# Patient Record
Sex: Male | Born: 1955
Health system: Southern US, Community
[De-identification: ages and names within clinical notes are randomized; demographics above are authoritative.]

## PROBLEM LIST (undated history)

## (undated) DIAGNOSIS — E785 Hyperlipidemia, unspecified: Secondary | ICD-10-CM

## (undated) DIAGNOSIS — M65839 Other synovitis and tenosynovitis, unspecified forearm: Secondary | ICD-10-CM

## (undated) DIAGNOSIS — B1920 Unspecified viral hepatitis C without hepatic coma: Secondary | ICD-10-CM

## (undated) DIAGNOSIS — Z800 Family history of malignant neoplasm of digestive organs: Secondary | ICD-10-CM

## (undated) DIAGNOSIS — Z86010 Personal history of colonic polyps: Secondary | ICD-10-CM

## (undated) DIAGNOSIS — F411 Generalized anxiety disorder: Secondary | ICD-10-CM

## (undated) DIAGNOSIS — I10 Essential (primary) hypertension: Secondary | ICD-10-CM

## (undated) DIAGNOSIS — J449 Chronic obstructive pulmonary disease, unspecified: Secondary | ICD-10-CM

## (undated) DIAGNOSIS — K219 Gastro-esophageal reflux disease without esophagitis: Secondary | ICD-10-CM

## (undated) DIAGNOSIS — K5790 Diverticulosis of intestine, part unspecified, without perforation or abscess without bleeding: Secondary | ICD-10-CM

## (undated) DIAGNOSIS — M65849 Other synovitis and tenosynovitis, unspecified hand: Secondary | ICD-10-CM

## (undated) HISTORY — DX: Unspecified viral hepatitis C without hepatic coma: B19.20

## (undated) HISTORY — DX: Hyperlipidemia, unspecified: E78.5

## (undated) HISTORY — DX: Diverticulosis of intestine, part unspecified, without perforation or abscess without bleeding: K57.90

## (undated) HISTORY — DX: Other synovitis and tenosynovitis, unspecified hand: M65.849

## (undated) HISTORY — DX: Personal history of colonic polyps: Z86.010

## (undated) HISTORY — DX: Chronic obstructive pulmonary disease, unspecified: J44.9

## (undated) HISTORY — DX: Essential (primary) hypertension: I10

## (undated) HISTORY — DX: Other synovitis and tenosynovitis, unspecified forearm: M65.839

## (undated) HISTORY — PX: LUMBAR LAMINECTOMY: SHX95

## (undated) HISTORY — DX: Gastro-esophageal reflux disease without esophagitis: K21.9

## (undated) HISTORY — DX: Family history of malignant neoplasm of digestive organs: Z80.0

## (undated) HISTORY — DX: Generalized anxiety disorder: F41.1

## (undated) NOTE — *Deleted (*Deleted)
Synopsis: Return visit for COPD  Subjective:   PATIENT ID: Trevor Mack GENDER: male DOB: 1956/06/06, MRN: [id number]   HPI  No chief complaint on file.   Trevor Mack is a 7 year old male, daily smoker with history of COPD who returns to pulmonary clinic with worsening shortness of breath.   He continues to smoke 2 packs per day. He is taking trelegy ellipta daily and has been using his albuterol inhaler 2-3 times per day over the last couple of weeks due to shortness of breath. He reports increased wheezing and chest tightness. He has sputum production in the mornings which is clear and has increased some since he has developed worsening shortness of breath. He denies any prednisone use or hospitalizations of the past year.   Past Medical History:  Diagnosis Date  . ANXIETY 07/23/2008  . COLONIC POLYPS, HX OF 03/26/2006   MULTIPLE FRAGMENTS OF TUBULAR ADENOMAS POLYPS  . Diverticulosis   . Family history of colon cancer    Father   . GERD 07/23/2008  . Hepatitis C   . HYPERLIPIDEMIA 07/23/2008  . HYPERTENSION 07/23/2008  . TENDINITIS, LEFT THUMB 07/15/2010     Family History  Problem Relation Age of Onset  . Colon cancer Father   . Hypertension Father   . Diabetes Father   . Heart attack Father      Social History   Socioeconomic History  . Marital status: Widowed    Spouse name: Not on file  . Number of children: 0  . Years of education: Not on file  . Highest education level: Not on file  Occupational History    Employer: [employer]  Tobacco Use  . Smoking status: Current Every Day Smoker    Packs/day: 2.00    Years: 35.00    Pack years: 70.00    Types: Cigarettes  . Smokeless tobacco: Never Used  . Tobacco comment: Started smoking at age 54.  Currently smoking 1 1/2 ppd.  Vaping Use  . Vaping Use: Never used  Substance and Sexual Activity  . Alcohol use: No  . Drug use: No  . Sexual activity: Not on file  Other Topics Concern  . Not on  file  Social History Narrative  . Not on file   Social Determinants of Health   Financial Resource Strain:   . Difficulty of Paying Living Expenses: Not on file  Food Insecurity:   . Worried About Programme researcher, broadcasting/film/video in the Last Year: Not on file  . Ran Out of Food in the Last Year: Not on file  Transportation Needs:   . Lack of Transportation (Medical): Not on file  . Lack of Transportation (Non-Medical): Not on file  Physical Activity:   . Days of Exercise per Week: Not on file  . Minutes of Exercise per Session: Not on file  Stress:   . Feeling of Stress : Not on file  Social Connections:   . Frequency of Communication with Friends and Family: Not on file  . Frequency of Social Gatherings with Friends and Family: Not on file  . Attends Religious Services: Not on file  . Active Member of Clubs or Organizations: Not on file  . Attends Banker Meetings: Not on file  . Marital Status: Not on file  Intimate Partner Violence:   . Fear of Current or Ex-Partner: Not on file  . Emotionally Abused: Not on file  . Physically Abused: Not on file  .  Sexually Abused: Not on file     No Known Allergies   Outpatient Medications Prior to Visit  Medication Sig Dispense Refill  . albuterol (PROVENTIL) (2.5 MG/3ML) 0.083% nebulizer solution Take 3 mLs (2.5 mg total) by nebulization every 6 (six) hours as needed for wheezing or shortness of breath. Dx code: J44.9 75 mL 11  . albuterol (VENTOLIN HFA) 108 (90 Base) MCG/ACT inhaler Inhale 1-2 puffs into the lungs every 6 (six) hours as needed. 8 g 5  . amLODipine (NORVASC) 5 MG tablet Take 1 tablet by mouth once daily 90 tablet 0  . atorvastatin (LIPITOR) 40 MG tablet Take 1 tablet by mouth once daily 90 tablet 0  . azithromycin (ZITHROMAX) 250 MG tablet Take 2 tablets on day 1, then 1 tablet daily until gone (Patient not taking: Reported on 09/06/2020) 6 tablet 0  . citalopram (CELEXA) 40 MG tablet Take 1 tablet (40 mg total) by  mouth daily. 30 tablet 5  . Fluticasone-Umeclidin-Vilant (TRELEGY ELLIPTA) 100-62.5-25 MCG/INH AEPB Inhale 1 puff into the lungs daily. 60 each 5  . ibuprofen (ADVIL) 200 MG tablet Take 400 mg by mouth every 6 (six) hours as needed.    Marland Kitchen ketoconazole (NIZORAL) 2 % cream Apply 1 application topically daily. 15 g 0  . LORazepam (ATIVAN) 0.5 MG tablet Take 1 tablet by mouth twice daily 60 tablet 0  . losartan (COZAAR) 100 MG tablet Take 1 tablet (100 mg total) by mouth daily. 90 tablet 1  . omeprazole (PRILOSEC) 40 MG capsule Take 1 capsule (40 mg total) by mouth daily. 90 capsule 3  . tamsulosin (FLOMAX) 0.4 MG CAPS capsule Take 1 capsule (0.4 mg total) by mouth daily. 90 capsule 3   No facility-administered medications prior to visit.    ROS    Objective:  There were no vitals filed for this visit.   Physical Exam    CBC    Component Value Date/Time   WBC 10.1 04/17/2018 0839   RBC 5.70 04/17/2018 0839   HGB 16.2 04/17/2018 0839   HCT 47.3 04/17/2018 0839   PLT 169.0 04/17/2018 0839   MCV 83.0 04/17/2018 0839   MCHC 34.3 04/17/2018 0839   RDW 14.7 04/17/2018 0839   LYMPHSABS 2.0 04/17/2018 0839   MONOABS 0.6 04/17/2018 0839   EOSABS 0.1 04/17/2018 0839   BASOSABS 0.1 04/17/2018 0839   BMP Latest Ref Rng & Units 05/26/2020 06/23/2019 03/05/2019  Glucose 70 - 99 mg/dL 93 91 78  BUN 6 - 23 mg/dL 19 13 18   Creatinine 0.40 - 1.50 mg/dL 1.30 8.65 7.84  Sodium 135 - 145 mEq/L 137 140 137  Potassium 3.5 - 5.1 mEq/L 4.1 3.6 3.6  Chloride 96 - 112 mEq/L 101 102 99  CO2 19 - 32 mEq/L 29 27 28   Calcium 8.4 - 10.5 mg/dL 8.8 8.5 9.0   Chest imaging: 11/11/2018 There is no edema or consolidation. The heart size and pulmonary vascularity are normal. No adenopathy. No evident bone lesions.  PFT: PFT Results Latest Ref Rng & Units 04/12/2017  FVC-Pre L 2.43  FVC-Predicted Pre % 52  FVC-Post L 2.85  FVC-Predicted Post % 61  Pre FEV1/FVC % % 62  Post FEV1/FCV % % 61  FEV1-Pre L  1.49  FEV1-Predicted Pre % 42  FEV1-Post L 1.75  DLCO uncorrected ml/min/mmHg 12.29  DLCO UNC% % 39  DLCO corrected ml/min/mmHg 11.82  DLCO COR %Predicted % 38  DLVA Predicted % 61  TLC L 7.02  TLC %  Predicted % 103  RV % Predicted % 214   Labs: Reviewed as above   Sleep study: 12/2017: AHI normal, O2 mean 93%, lowest 91% he left the test early and says that he only got about 2 hours worth of sleep.    Assessment & Plan:   No diagnosis found.  Discussion: ***    Current Outpatient Medications:  .  albuterol (PROVENTIL) (2.5 MG/3ML) 0.083% nebulizer solution, Take 3 mLs (2.5 mg total) by nebulization every 6 (six) hours as needed for wheezing or shortness of breath. Dx code: J44.9, Disp: 75 mL, Rfl: 11 .  albuterol (VENTOLIN HFA) 108 (90 Base) MCG/ACT inhaler, Inhale 1-2 puffs into the lungs every 6 (six) hours as needed., Disp: 8 g, Rfl: 5 .  amLODipine (NORVASC) 5 MG tablet, Take 1 tablet by mouth once daily, Disp: 90 tablet, Rfl: 0 .  atorvastatin (LIPITOR) 40 MG tablet, Take 1 tablet by mouth once daily, Disp: 90 tablet, Rfl: 0 .  azithromycin (ZITHROMAX) 250 MG tablet, Take 2 tablets on day 1, then 1 tablet daily until gone (Patient not taking: Reported on 09/06/2020), Disp: 6 tablet, Rfl: 0 .  citalopram (CELEXA) 40 MG tablet, Take 1 tablet (40 mg total) by mouth daily., Disp: 30 tablet, Rfl: 5 .  Fluticasone-Umeclidin-Vilant (TRELEGY ELLIPTA) 100-62.5-25 MCG/INH AEPB, Inhale 1 puff into the lungs daily., Disp: 60 each, Rfl: 5 .  ibuprofen (ADVIL) 200 MG tablet, Take 400 mg by mouth every 6 (six) hours as needed., Disp: , Rfl:  .  ketoconazole (NIZORAL) 2 % cream, Apply 1 application topically daily., Disp: 15 g, Rfl: 0 .  LORazepam (ATIVAN) 0.5 MG tablet, Take 1 tablet by mouth twice daily, Disp: 60 tablet, Rfl: 0 .  losartan (COZAAR) 100 MG tablet, Take 1 tablet (100 mg total) by mouth daily., Disp: 90 tablet, Rfl: 1 .  omeprazole (PRILOSEC) 40 MG capsule, Take 1 capsule  (40 mg total) by mouth daily., Disp: 90 capsule, Rfl: 3 .  tamsulosin (FLOMAX) 0.4 MG CAPS capsule, Take 1 capsule (0.4 mg total) by mouth daily., Disp: 90 capsule, Rfl: 3

---

## 1998-05-25 ENCOUNTER — Ambulatory Visit (HOSPITAL_COMMUNITY): Admission: RE | Admit: 1998-05-25 | Discharge: 1998-05-25 | Payer: Self-pay | Admitting: Family Medicine

## 1998-07-09 ENCOUNTER — Ambulatory Visit (HOSPITAL_COMMUNITY): Admission: RE | Admit: 1998-07-09 | Discharge: 1998-07-09 | Payer: Self-pay | Admitting: Family Medicine

## 1998-11-01 ENCOUNTER — Other Ambulatory Visit: Admission: RE | Admit: 1998-11-01 | Discharge: 1998-11-01 | Payer: Self-pay | Admitting: Gastroenterology

## 1999-10-05 ENCOUNTER — Encounter: Payer: Self-pay | Admitting: Internal Medicine

## 2005-03-27 ENCOUNTER — Ambulatory Visit: Payer: Self-pay | Admitting: Internal Medicine

## 2005-07-31 ENCOUNTER — Ambulatory Visit: Payer: Self-pay | Admitting: Internal Medicine

## 2005-10-05 ENCOUNTER — Ambulatory Visit: Payer: Self-pay | Admitting: Internal Medicine

## 2006-01-30 ENCOUNTER — Ambulatory Visit: Payer: Self-pay | Admitting: Internal Medicine

## 2006-02-06 ENCOUNTER — Ambulatory Visit: Payer: Self-pay | Admitting: Internal Medicine

## 2006-02-20 ENCOUNTER — Ambulatory Visit: Payer: Self-pay | Admitting: Internal Medicine

## 2006-02-20 ENCOUNTER — Encounter: Admission: RE | Admit: 2006-02-20 | Discharge: 2006-02-20 | Payer: Self-pay | Admitting: Internal Medicine

## 2006-03-14 ENCOUNTER — Ambulatory Visit: Payer: Self-pay | Admitting: Gastroenterology

## 2006-03-26 ENCOUNTER — Encounter: Payer: Self-pay | Admitting: Internal Medicine

## 2006-03-26 ENCOUNTER — Encounter (INDEPENDENT_AMBULATORY_CARE_PROVIDER_SITE_OTHER): Payer: Self-pay | Admitting: *Deleted

## 2006-03-26 ENCOUNTER — Ambulatory Visit: Payer: Self-pay | Admitting: Gastroenterology

## 2006-03-26 DIAGNOSIS — Z860100 Personal history of colon polyps, unspecified: Secondary | ICD-10-CM

## 2006-03-26 HISTORY — DX: Personal history of colonic polyps: Z86.010

## 2006-03-26 HISTORY — DX: Personal history of colon polyps, unspecified: Z86.0100

## 2006-04-25 ENCOUNTER — Encounter: Payer: Self-pay | Admitting: Internal Medicine

## 2006-08-06 ENCOUNTER — Ambulatory Visit: Payer: Self-pay | Admitting: Oncology

## 2007-03-26 ENCOUNTER — Ambulatory Visit: Payer: Self-pay | Admitting: Internal Medicine

## 2007-03-26 LAB — CONVERTED CEMR LAB
ALT: 32 units/L (ref 0–40)
AST: 22 units/L (ref 0–37)
Albumin: 4.1 g/dL (ref 3.5–5.2)
Alkaline Phosphatase: 120 units/L — ABNORMAL HIGH (ref 39–117)
BUN: 15 mg/dL (ref 6–23)
Basophils Absolute: 0.1 10*3/uL (ref 0.0–0.1)
Calcium: 9.2 mg/dL (ref 8.4–10.5)
Chloride: 102 meq/L (ref 96–112)
Cholesterol: 135 mg/dL (ref 0–200)
Creatinine, Ser: 0.7 mg/dL (ref 0.4–1.5)
Eosinophils Absolute: 0.2 10*3/uL (ref 0.0–0.6)
Eosinophils Relative: 2.3 % (ref 0.0–5.0)
Hemoglobin: 17.7 g/dL — ABNORMAL HIGH (ref 13.0–17.0)
Lymphocytes Relative: 31.7 % (ref 12.0–46.0)
MCHC: 34.1 g/dL (ref 30.0–36.0)
MCV: 87.6 fL (ref 78.0–100.0)
Monocytes Relative: 6.6 % (ref 3.0–11.0)
Neutro Abs: 5 10*3/uL (ref 1.4–7.7)
Platelets: 170 10*3/uL (ref 150–400)
RBC: 5.95 M/uL — ABNORMAL HIGH (ref 4.22–5.81)
Sodium: 139 meq/L (ref 135–145)
Total Protein: 7.1 g/dL (ref 6.0–8.3)
Triglycerides: 152 mg/dL — ABNORMAL HIGH (ref 0–149)

## 2007-08-16 ENCOUNTER — Ambulatory Visit: Payer: Self-pay | Admitting: Gastroenterology

## 2007-08-30 ENCOUNTER — Ambulatory Visit: Payer: Self-pay | Admitting: Gastroenterology

## 2007-08-30 ENCOUNTER — Encounter: Payer: Self-pay | Admitting: Gastroenterology

## 2007-08-30 ENCOUNTER — Encounter: Payer: Self-pay | Admitting: Internal Medicine

## 2008-03-25 ENCOUNTER — Telehealth: Payer: Self-pay | Admitting: Internal Medicine

## 2008-06-17 ENCOUNTER — Encounter: Payer: Self-pay | Admitting: Internal Medicine

## 2008-07-13 ENCOUNTER — Telehealth: Payer: Self-pay | Admitting: Internal Medicine

## 2008-07-15 ENCOUNTER — Ambulatory Visit: Payer: Self-pay | Admitting: Internal Medicine

## 2008-07-15 LAB — CONVERTED CEMR LAB
ALT: 31 units/L (ref 0–53)
AST: 19 units/L (ref 0–37)
BUN: 16 mg/dL (ref 6–23)
Basophils Relative: 0.3 % (ref 0.0–3.0)
Bilirubin, Direct: 0.1 mg/dL (ref 0.0–0.3)
CO2: 28 meq/L (ref 19–32)
Creatinine, Ser: 0.9 mg/dL (ref 0.4–1.5)
Eosinophils Absolute: 0.2 10*3/uL (ref 0.0–0.7)
Eosinophils Relative: 2.9 % (ref 0.0–5.0)
HDL: 22.8 mg/dL — ABNORMAL LOW (ref >39.0)
Hemoglobin: 16.9 g/dL (ref 13.0–17.0)
MCHC: 34.4 g/dL (ref 30.0–36.0)
Monocytes Absolute: 0.4 10*3/uL (ref 0.1–1.0)
RBC: 5.45 M/uL (ref 4.22–5.81)
Sodium: 143 meq/L (ref 135–145)
Total Bilirubin: 0.6 mg/dL (ref 0.3–1.2)
Triglycerides: 99 mg/dL (ref 0–149)
WBC: 7.5 10*3/uL (ref 4.5–10.5)

## 2008-07-23 ENCOUNTER — Ambulatory Visit: Payer: Self-pay | Admitting: Internal Medicine

## 2008-07-23 DIAGNOSIS — E785 Hyperlipidemia, unspecified: Secondary | ICD-10-CM

## 2008-07-23 DIAGNOSIS — F411 Generalized anxiety disorder: Secondary | ICD-10-CM

## 2008-07-23 DIAGNOSIS — Z86010 Personal history of colonic polyps: Secondary | ICD-10-CM

## 2008-07-23 DIAGNOSIS — K219 Gastro-esophageal reflux disease without esophagitis: Secondary | ICD-10-CM

## 2008-07-23 DIAGNOSIS — I10 Essential (primary) hypertension: Secondary | ICD-10-CM

## 2008-07-23 HISTORY — DX: Essential (primary) hypertension: I10

## 2008-07-23 HISTORY — DX: Gastro-esophageal reflux disease without esophagitis: K21.9

## 2008-07-23 HISTORY — DX: Hyperlipidemia, unspecified: E78.5

## 2008-07-23 HISTORY — DX: Generalized anxiety disorder: F41.1

## 2008-07-23 LAB — CONVERTED CEMR LAB
Bilirubin Urine: NEGATIVE
Blood in Urine, dipstick: NEGATIVE
Glucose, Urine, Semiquant: NEGATIVE
Ketones, urine, test strip: NEGATIVE
Nitrite: NEGATIVE
Specific Gravity, Urine: 1.015
Urobilinogen, UA: NEGATIVE
WBC Urine, dipstick: NEGATIVE
pH: 7

## 2008-10-08 ENCOUNTER — Telehealth: Payer: Self-pay | Admitting: Internal Medicine

## 2009-08-16 ENCOUNTER — Encounter (INDEPENDENT_AMBULATORY_CARE_PROVIDER_SITE_OTHER): Payer: Self-pay | Admitting: *Deleted

## 2009-12-01 ENCOUNTER — Ambulatory Visit: Payer: Self-pay | Admitting: Internal Medicine

## 2009-12-01 DIAGNOSIS — J069 Acute upper respiratory infection, unspecified: Secondary | ICD-10-CM | POA: Insufficient documentation

## 2009-12-13 ENCOUNTER — Telehealth: Payer: Self-pay | Admitting: Internal Medicine

## 2010-03-28 ENCOUNTER — Ambulatory Visit: Payer: Self-pay | Admitting: Internal Medicine

## 2010-03-28 LAB — CONVERTED CEMR LAB
ALT: 32 units/L (ref 0–53)
AST: 20 units/L (ref 0–37)
Alkaline Phosphatase: 78 units/L (ref 39–117)
Basophils Absolute: 0.1 10*3/uL (ref 0.0–0.1)
Basophils Relative: 0.7 % (ref 0.0–3.0)
Blood in Urine, dipstick: NEGATIVE
CO2: 29 meq/L (ref 19–32)
Calcium: 9.2 mg/dL (ref 8.4–10.5)
Chloride: 107 meq/L (ref 96–112)
Creatinine, Ser: 1 mg/dL (ref 0.4–1.5)
Glucose, Bld: 98 mg/dL (ref 70–99)
HCT: 49.4 % (ref 39.0–52.0)
Hemoglobin: 17.2 g/dL — ABNORMAL HIGH (ref 13.0–17.0)
Ketones, urine, test strip: NEGATIVE
Neutrophils Relative %: 64.4 % (ref 43.0–77.0)
Nitrite: NEGATIVE
PSA: 1.24 ng/mL (ref 0.10–4.00)
Platelets: 155 10*3/uL (ref 150.0–400.0)
Protein, U semiquant: NEGATIVE
RDW: 14.5 % (ref 11.5–14.6)
Urobilinogen, UA: 0.2
VLDL: 35.6 mg/dL (ref 0.0–40.0)
pH: 6

## 2010-04-04 ENCOUNTER — Ambulatory Visit: Payer: Self-pay | Admitting: Internal Medicine

## 2010-07-15 ENCOUNTER — Ambulatory Visit: Payer: Self-pay | Admitting: Internal Medicine

## 2010-07-15 DIAGNOSIS — M65849 Other synovitis and tenosynovitis, unspecified hand: Secondary | ICD-10-CM

## 2010-07-15 DIAGNOSIS — M65839 Other synovitis and tenosynovitis, unspecified forearm: Secondary | ICD-10-CM

## 2010-07-15 HISTORY — DX: Other synovitis and tenosynovitis, unspecified forearm: M65.839

## 2010-08-01 ENCOUNTER — Telehealth: Payer: Self-pay | Admitting: Gastroenterology

## 2010-08-01 ENCOUNTER — Encounter: Payer: Self-pay | Admitting: Gastroenterology

## 2010-08-31 ENCOUNTER — Encounter (INDEPENDENT_AMBULATORY_CARE_PROVIDER_SITE_OTHER): Payer: Self-pay | Admitting: *Deleted

## 2011-01-26 NOTE — Letter (Signed)
Summary: Colonoscopy Letter  [facility]  [address] [place], [place] [address]   Phone: [phone]  Fax: [phone]      August 31, 2010 MRN: [id number]   Trevor Mack [address] [place], [place]  [address]   Dear Mr. Volpe,   According to your medical record, it is time for you to schedule a Colonoscopy. The American Cancer Society recommends this procedure as a method to detect early colon cancer. Patients with a family history of colon cancer, or a personal history of colon polyps or inflammatory bowel disease are at increased risk.  This letter has beeen generated based on the recommendations made at the time of your procedure. If you feel that in your particular situation this may no longer apply, please contact our office.  Please call our office at [phone] to schedule this appointment or to update your records at your earliest convenience.  Thank you for cooperating with [place] to provide you with the very best care possible.   Sincerely,   Vania Rea. Jarold Motto, M.D.  [facility] Gastroenterology Division [phone]

## 2011-01-26 NOTE — Letter (Signed)
Summary: New Patient letter  [facility]  [address] [place], [place] [address]   Phone: [phone]  Fax: [phone]       08/01/2010 MRN: [id number]  Trevor Mack [address] [place], [place]  [address]  Dear Trevor Mack,  Welcome to the Gastroenterology Division at [facility].    You are scheduled to see Dr.  Jarold Motto on 09-13-10 at 10am on the 3rd floor at [facility], [address]  We ask that you try to arrive at our office 15 minutes prior to your appointment time to allow for check-in.  We would like you to complete the enclosed self-administered evaluation form prior to your visit and bring it with you on the day of your appointment.  We will review it with you.  Also, please bring a complete list of all your medications or, if you prefer, bring the medication bottles and we will list them.  Please bring your insurance card so that we may make a copy of it.  If your insurance requires a referral to see a specialist, please bring your referral form from your primary care physician.  Co-payments are due at the time of your visit and may be paid by cash, check or credit card.     Your office visit will consist of a consult with your physician (includes a physical exam), any laboratory testing he/she may order, scheduling of any necessary diagnostic testing (e.g. x-ray, ultrasound, CT-scan), and scheduling of a procedure (e.g. Endoscopy, Colonoscopy) if required.  Please allow enough time on your schedule to allow for any/all of these possibilities.    If you cannot keep your appointment, please call [phone] to cancel or reschedule prior to your appointment date.  This allows [place] the opportunity to schedule an appointment for another patient in need of care.  If you do not cancel or reschedule by 5 p.m. the business day prior to your appointment date, you will be charged a $50.00 late cancellation/no-show fee.    Thank you for choosing [facility] for your medical needs.  We appreciate the opportunity to care for you.  Please visit [place] at our website  to learn more about our practice.                     Sincerely,                                                             The Gastroenterology Division

## 2011-01-26 NOTE — Progress Notes (Signed)
Summary: Blood in stools  Phone Note Call from Patient Call back at Home Phone [phone]   Call For: Dr Jarold Motto Reason for Call: Talk to Nurse Summary of Call: Saw blood in his stools and feel sthat appt on 09-13-10 is too far away.  Initial call taken by: Leanor Kail [facility],  August 01, 2010 12:21 PM  Follow-up for Phone Call        Pt states he had diarrhea for a few days, felt bad and may have had a fever.  Thought he had a virus.  Devoria Albe noticed some blood on tissue when he wiped.  Bright red blood.  No so far today.  Pt sch to see Dr. Jarold Motto on 08/16/10.  Pt states it is about time for colonoscopy.  Pt instructed if bleeding increases to call us back. Follow-up by: Ashok Cordia RN,  August 01, 2010 2:49 PM

## 2011-01-26 NOTE — Assessment & Plan Note (Signed)
Summary: CPX // RS   Vital Signs:  Patient profile:   55 year old male Height:      68 inches Weight:      199 pounds BMI:     30.37 Temp:     98.2 degrees F oral BP sitting:   120 / 86  (left arm) Cuff size:   regular  Vitals Entered By: Duard Brady LPN (April 04, 2010 1:01 PM) CC: cpx - doing ok , questions about stopping smoking , labs done Is Patient Diabetic? No   CC:  cpx - doing ok , questions about stopping smoking , and labs done.  History of Present Illness: 55 year old patient who is seen today for a wellness exam.  Medical problems include dyslipidemia, hypertension, ongoing tobacco use, and a history of colonic polyps.  He has a family history of colon cancer.  His father affected.  His last colonoscopy was 3 years ago.  No concerns or complaints today.  He discontinued chantix due to some mild flank discomfort, but it seemed to be benefiting as far as smoking cessation.  Laboratory studies were reviewed.  Total cholesterol 217 with a LDL 162.  He is on pravastatin 40 mg daily  Preventive Screening-Counseling & Management  Alcohol-Tobacco     Smoking Status: current  Allergies (verified): No Known Drug Allergies  Past History:  Past Medical History: Reviewed history from 07/23/2008 and no changes required. Anxiety Colonic polyps, hx of Hyperlipidemia Hypertension GERD  Past Surgical History: Reviewed history from 07/23/2008 and no changes required. Lumbar laminectomy  colonoscopy  2008  Family History: Reviewed history from 07/23/2008 and no changes required. mother is in her 45s.  History of senile dementia of the Alzheimer's type father died age 42, colon cancer, history of diabetes, coronary artery disease, prior MI  Two brothers and two sisters are in good health  Social History: Reviewed history from 07/23/2008 and no changes required. Married wife with dementia residing in a nursing home  Physical Exam  General:   Well-developed,well-nourished,in no acute distress; alert,appropriate and cooperative throughout examinationl; 124/84 Head:  Normocephalic and atraumatic without obvious abnormalities. No apparent alopecia or balding. Eyes:  No corneal or conjunctival inflammation noted. EOMI. Perrla. Funduscopic exam benign, without hemorrhages, exudates or papilledema. Vision grossly normal. Ears:  External ear exam shows no significant lesions or deformities.  Otoscopic examination reveals clear canals, tympanic membranes are intact bilaterally without bulging, retraction, inflammation or discharge. Hearing is grossly normal bilaterally. Nose:  External nasal examination shows no deformity or inflammation. Nasal mucosa are pink and moist without lesions or exudates. Mouth:  Oral mucosa and oropharynx without lesions or exudates.  Teeth in good repair. Neck:  No deformities, masses, or tenderness noted. Chest Wall:  No deformities, masses, tenderness or gynecomastia noted. Breasts:  No masses or gynecomastia noted Lungs:  Normal respiratory effort, chest expands symmetrically. Lungs are clear to auscultation, no crackles or wheezes. Heart:  Normal rate and regular rhythm. S1 and S2 normal without gallop, murmur, click, rub or other extra sounds. Abdomen:  Bowel sounds positive,abdomen soft and non-tender without masses, organomegaly or hernias noted. Rectal:  No external abnormalities noted. Normal sphincter tone. No rectal masses or tenderness. Genitalia:  Testes bilaterally descended without nodularity, tenderness or masses. No scrotal masses or lesions. No penis lesions or urethral discharge. Prostate:  2+ enlarged.  2+ enlarged.   Msk:  No deformity or scoliosis noted of thoracic or lumbar spine.   Pulses:  R and L carotid,radial,femoral,dorsalis pedis and  posterior tibial pulses are full and equal bilaterally Extremities:  No clubbing, cyanosis, edema, or deformity noted with normal full range of motion of  all joints.   Neurologic:  No cranial nerve deficits noted. Station and gait are normal. Plantar reflexes are down-going bilaterally. DTRs are symmetrical throughout. Sensory, motor and coordinative functions appear intact. Skin:  Intact without suspicious lesions or rashes Cervical Nodes:  No lymphadenopathy noted Axillary Nodes:  No palpable lymphadenopathy Inguinal Nodes:  No significant adenopathy Psych:  Cognition and judgment appear intact. Alert and cooperative with normal attention span and concentration. No apparent delusions, illusions, hallucinations   Impression & Recommendations:  Problem # 1:  Preventive Health Care (ICD-V70.0)  Complete Medication List: 1)  Lisinopril 10 Mg Tabs (Lisinopril) .Marland Kitchen.. 1 once daily 2)  Paxil 20 Mg Tabs (Paroxetine hcl) .Marland Kitchen.. 1 once daily 3)  Prilosec Otc 20 Mg Tbec (Omeprazole magnesium) .... Take 1 tablet by mouth once a day 4)  Pravastatin Sodium 40 Mg Tabs (Pravastatin sodium) .... Two  daily 5)  Chantix Starting Month Pak 0.5 Mg X 11 & 1 Mg X 42 Tabs (Varenicline tartrate) .... As directed 6)  Chantix 1 Mg Tabs (Varenicline tartrate) .... One twice daily 7)  Mucinex Dm 30-600 Mg Xr12h-tab (Dextromethorphan-guaifenesin) .... One by mouth bid 8)  Pravastatin Sodium 80 Mg Tabs (Pravastatin sodium) .... One daily  Patient Instructions: 1)  Please schedule a follow-up appointment in 6 months. 2)  Limit your Sodium (Salt). 3)  It is important that you exercise regularly at least 20 minutes 5 times a week. If you develop chest pain, have severe difficulty breathing, or feel very tired , stop exercising immediately and seek medical attention. 4)  You need to lose weight. Consider a lower calorie diet and regular exercise.  5)  Check your Blood Pressure regularly. If it is above: 140/90  you should make an appointment. Prescriptions: PRAVASTATIN SODIUM 80 MG TABS (PRAVASTATIN SODIUM) one daily  #90 x 6   Entered and Authorized by:   Gordy Savers  MD   Signed by:   Gordy Savers  MD on 04/04/2010   Method used:   Print then Give to Patient   RxID:   [id number] PRAVASTATIN SODIUM 40 MG  TABS (PRAVASTATIN SODIUM) two  daily  #180 x 6   Entered and Authorized by:   Gordy Savers  MD   Signed by:   Gordy Savers  MD on 04/04/2010   Method used:   Print then Give to Patient   RxID:   [id number] PRILOSEC OTC 20 MG TBEC (OMEPRAZOLE MAGNESIUM) Take 1 tablet by mouth once a day  #90 x 6   Entered and Authorized by:   Gordy Savers  MD   Signed by:   Gordy Savers  MD on 04/04/2010   Method used:   Print then Give to Patient   RxID:   [id number] PAXIL 20 MG TABS (PAROXETINE HCL) 1 once daily  #90 x 6   Entered and Authorized by:   Gordy Savers  MD   Signed by:   Gordy Savers  MD on 04/04/2010   Method used:   Print then Give to Patient   RxID:   [id number] LISINOPRIL 10 MG TABS (LISINOPRIL) 1 once daily  #90 x 6   Entered and Authorized by:   Gordy Savers  MD   Signed by:   Gordy Savers  MD on 04/04/2010   Method  used:   Print then Give to Patient   RxID:   [id number] PRAVASTATIN SODIUM 80 MG TABS (PRAVASTATIN SODIUM) one daily  #90 x 6   Entered and Authorized by:   Gordy Savers  MD   Signed by:   Gordy Savers  MD on 04/04/2010   Method used:   Electronically to        [facility] [phone]* (retail)       [address]       [place], [place]  [address]       Ph: [phone]       Fax: [phone]   RxID:   [id number] PRAVASTATIN SODIUM 40 MG  TABS (PRAVASTATIN SODIUM) two  daily  #180 x 6   Entered and Authorized by:   Gordy Savers  MD   Signed by:   Gordy Savers  MD on 04/04/2010   Method used:   Electronically to        [facility] # [phone]* (retail)       [address]       [place], [place]  [address]       Ph: [phone]       Fax:  [phone]   RxID:   [id number] PRILOSEC OTC 20 MG TBEC (OMEPRAZOLE MAGNESIUM) Take 1 tablet by mouth once a day  #90 x 6   Entered and Authorized by:   Gordy Savers  MD   Signed by:   Gordy Savers  MD on 04/04/2010   Method used:   Electronically to        [facility] # [phone]* (retail)       [address]       [place], [place]  [address]       Ph: [phone]       Fax: [phone]   RxID:   [id number] PAXIL 20 MG TABS (PAROXETINE HCL) 1 once daily  #90 x 6   Entered and Authorized by:   Gordy Savers  MD   Signed by:   Gordy Savers  MD on 04/04/2010   Method used:   Electronically to        [facility] # [phone]* (retail)       [address]       [place], [place]  [address]       Ph: [phone]       Fax: [phone]   RxID:   [id number] LISINOPRIL 10 MG TABS (LISINOPRIL) 1 once daily  #90 x 6   Entered and Authorized by:   Gordy Savers  MD   Signed by:   Gordy Savers  MD on 04/04/2010   Method used:   Electronically to        [facility] # [phone]* (retail)       [address]       [place], [place]  [address]       Ph: [phone]       Fax: [phone]   RxID:   [id number]

## 2011-01-26 NOTE — Assessment & Plan Note (Signed)
Summary: HAND PAIN FOR A COUPLE OF WKS/NJR   Vital Signs:  Patient profile:   55 year old male Weight:      200 pounds Temp:     98.4 degrees F oral BP sitting:   150 / 90  (left arm) Cuff size:   regular  Vitals Entered By: Kathrynn Speed CMA (July 15, 2010 10:57 AM) CC: Pain in left hand for x 2 weeks, sometimes shooting pain, src   CC:  Pain in left hand for x 2 weeks, sometimes shooting pain, and src.  History of Present Illness: 55 year old left handed patient, who has a history of hypertension, who presents with a two week history of left thumb pain.  Plan is maximal over the thenar eminence is also present on the dorsal aspect with some radiation up the left arm.  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 1 Once Daily 2)  Paxil 20 Mg Tabs (Paroxetine Hcl) .Marland Kitchen.. 1 Once Daily 3)  Prilosec Otc 20 Mg Tbec (Omeprazole Magnesium) .... Take 1 Tablet By Mouth Once A Day 4)  Pravastatin Sodium 40 Mg  Tabs (Pravastatin Sodium) .... Two  Daily 5)  Mucinex Dm 30-600 Mg Xr12h-Tab (Dextromethorphan-Guaifenesin) .... One By Mouth Bid 6)  Pravastatin Sodium 80 Mg Tabs (Pravastatin Sodium) .... One Daily  Allergies (verified): No Known Drug Allergies  Physical Exam  General:  Well-developed,well-nourished,in no acute distress; alert,appropriate and cooperative throughout examination; 140/80 Msk:  there is mild tenderness over the left thenar eminence, flexion and extension of the thumb against resistance, aggravated the pain.  No joint tenderness, swelling, or excessive warmth   Impression & Recommendations:  Problem # 1:  TENDINITIS, LEFT THUMB (ICD-727.05)  Problem # 2:  HYPERTENSION (ICD-401.9)  His updated medication list for this problem includes:    Lisinopril 10 Mg Tabs (Lisinopril) .Marland Kitchen... 1 once daily  His updated medication list for this problem includes:  Lisinopril 10 Mg Tabs (Lisinopril) .Marland Kitchen... 1 once daily  Complete Medication List: 1)  Lisinopril 10 Mg Tabs (Lisinopril) .Marland Kitchen.. 1 once daily 2)  Paxil 20 Mg Tabs (Paroxetine hcl) .Marland Kitchen.. 1 once daily 3)  Prilosec Otc 20 Mg Tbec (Omeprazole magnesium) .... Take 1 tablet by mouth once a day 4)  Pravastatin Sodium 40 Mg Tabs (Pravastatin sodium) .... Two  daily 5)  Mucinex Dm 30-600 Mg Xr12h-tab (Dextromethorphan-guaifenesin) .... One by mouth bid 6)  Pravastatin Sodium 80 Mg Tabs (Pravastatin sodium) .... One daily  Patient Instructions: 1)  avoid overuse activities as much as possible 2)  VIMOVO one twice daily

## 2011-04-24 ENCOUNTER — Other Ambulatory Visit: Payer: Self-pay | Admitting: Internal Medicine

## 2011-05-10 ENCOUNTER — Ambulatory Visit (INDEPENDENT_AMBULATORY_CARE_PROVIDER_SITE_OTHER): Payer: BC Managed Care – PPO | Admitting: Internal Medicine

## 2011-05-10 ENCOUNTER — Encounter: Payer: Self-pay | Admitting: Internal Medicine

## 2011-05-10 VITALS — BP 110/76 | Temp 98.6°F | Wt 205.0 lb

## 2011-05-10 DIAGNOSIS — I10 Essential (primary) hypertension: Secondary | ICD-10-CM

## 2011-05-10 DIAGNOSIS — M25519 Pain in unspecified shoulder: Secondary | ICD-10-CM

## 2011-05-10 DIAGNOSIS — M25512 Pain in left shoulder: Secondary | ICD-10-CM

## 2011-05-10 DIAGNOSIS — M542 Cervicalgia: Secondary | ICD-10-CM

## 2011-05-10 MED ORDER — CYCLOBENZAPRINE HCL 10 MG PO TABS: 10.0000 mg | ORAL_TABLET | Freq: Three times a day (TID) | ORAL | Status: DC | PRN

## 2011-05-10 MED ORDER — PRAVASTATIN SODIUM 40 MG PO TABS: 40.0000 mg | ORAL_TABLET | Freq: Every day | ORAL | Status: DC

## 2011-05-10 MED ORDER — LISINOPRIL 10 MG PO TABS: 10.0000 mg | ORAL_TABLET | Freq: Every day | ORAL | Status: DC

## 2011-05-10 MED ORDER — METHYLPREDNISOLONE ACETATE 80 MG/ML IJ SUSP
80.0000 mg | Freq: Once | INTRAMUSCULAR | Status: AC
  Administered 2011-05-10: 80 mg via INTRAMUSCULAR

## 2011-05-10 MED ORDER — OMEPRAZOLE 20 MG PO CPDR: 20.0000 mg | DELAYED_RELEASE_CAPSULE | Freq: Every day | ORAL | Status: DC

## 2011-05-10 MED ORDER — PAROXETINE HCL 40 MG PO TABS: 40.0000 mg | ORAL_TABLET | ORAL | Status: DC

## 2011-05-10 NOTE — Patient Instructions (Signed)
You  may move around, but avoid painful motions and activities.  Apply  Heat to the sore area for 15 to 20 minutes 3 or 4 times daily for the next two to 3 days.  Call or return to clinic prn if these symptoms worsen or fail to improve as anticipated.  

## 2011-05-10 NOTE — Progress Notes (Signed)
  Subjective:    Patient ID: Trevor Mack, male    DOB: 09-27-1956, 55 y.o.   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 He has treated hypertension which has been stable. He has a history of left shoulder bursitis in the past. He states he has some GI upset and wishes to avoid anti-inflammatory medications. These have frequently caused GI distress in the past    Review of Systems  Constitutional: Negative for fever, chills, appetite change and fatigue.  HENT: Negative for hearing loss, ear pain, congestion, sore throat, trouble swallowing, neck stiffness, dental problem, voice change and tinnitus.   Eyes: Negative for pain, discharge and visual disturbance.  Respiratory: Negative for cough, chest tightness, wheezing and stridor.   Cardiovascular: Negative for chest pain, palpitations and leg swelling.  Gastrointestinal: Negative for nausea, vomiting, abdominal pain, diarrhea, constipation, blood in stool and abdominal distention.  Genitourinary: Negative for urgency, hematuria, flank pain, discharge, difficulty urinating and genital sores.  Musculoskeletal: Positive for back pain. Negative for myalgias, joint swelling, arthralgias and gait problem.  Skin: Negative for rash.  Neurological: Negative for dizziness, syncope, speech difficulty, weakness, numbness and headaches.  Hematological: Negative for adenopathy. Does not bruise/bleed easily.  Psychiatric/Behavioral: Negative for behavioral problems and dysphoric mood. The patient is not nervous/anxious.        Objective:   Physical Exam  Constitutional: He appears well-developed and well-nourished. No distress.  Musculoskeletal: Normal range of motion. He exhibits no edema.       Full range of motion of the left shoulder. He did  have tenderness over the trapezius especially and also the left posterior neck musculature          Assessment & Plan:   Headache and neck pain. Musculoskeletal  In view of his GI intolerance to anti-inflammatory medications we'll treat with Depo-Medrol 80 mg daily was given a prescription for Flexeril. He will rest apply warm compresses. He will call if unimproved

## 2011-05-16 ENCOUNTER — Ambulatory Visit (INDEPENDENT_AMBULATORY_CARE_PROVIDER_SITE_OTHER): Payer: BC Managed Care – PPO | Admitting: Internal Medicine

## 2011-05-16 ENCOUNTER — Encounter: Payer: Self-pay | Admitting: Internal Medicine

## 2011-05-16 VITALS — Temp 97.9°F | Wt 200.0 lb

## 2011-05-16 DIAGNOSIS — D219 Benign neoplasm of connective and other soft tissue, unspecified: Secondary | ICD-10-CM

## 2011-05-16 DIAGNOSIS — D239 Other benign neoplasm of skin, unspecified: Secondary | ICD-10-CM

## 2011-05-16 NOTE — Patient Instructions (Signed)
Keep area clean and dry Apply a new  Band-Aid daily  Call if  signs of redness pain or drainage

## 2011-05-16 NOTE — Progress Notes (Signed)
  Subjective:    Patient ID: Trevor Mack, male    DOB: July 30, 1956, 55 y.o.   MRN: [id number]  HPI  55 year old patient who is seen today for excision of a benign lesion involving his left anterior neck area at the collar line. Over the years this has been consistently irritated due to friction from his collar. There has been very slow benign tumor growth.   Review of Systems  Skin: Positive for wound.       Objective:   Physical Exam  Neck:         A pedunculated 5 mm lesion slightly hyperkeratotic consistent with a fibroma present involving the left anterior neck area          Assessment & Plan:   Pedunculated fibroma left anterolateral neck. After prep with Betadine and alcohol, the area was locally anesthetized with 1% Xylocaine with epinephrine. The lesion was excised just beneath the skin level. Hemostasis was obtained with pressure; antibiotic ointment and a bandage applied

## 2011-11-01 ENCOUNTER — Other Ambulatory Visit: Payer: Self-pay | Admitting: Internal Medicine

## 2012-01-25 ENCOUNTER — Ambulatory Visit (INDEPENDENT_AMBULATORY_CARE_PROVIDER_SITE_OTHER): Payer: BC Managed Care – PPO | Admitting: Internal Medicine

## 2012-01-25 ENCOUNTER — Encounter: Payer: Self-pay | Admitting: Internal Medicine

## 2012-01-25 DIAGNOSIS — E785 Hyperlipidemia, unspecified: Secondary | ICD-10-CM

## 2012-01-25 DIAGNOSIS — F411 Generalized anxiety disorder: Secondary | ICD-10-CM

## 2012-01-25 MED ORDER — CITALOPRAM HYDROBROMIDE 40 MG PO TABS: 40.0000 mg | ORAL_TABLET | Freq: Every day | ORAL | Status: DC

## 2012-01-25 NOTE — Patient Instructions (Signed)
It is important that you exercise regularly, at least 20 minutes 3 to 4 times per week.  If you develop chest pain or shortness of breath seek  medical attention.  Limit your sodium (Salt) intake  Behavioral Health referral as discussed
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
## 2012-02-22 ENCOUNTER — Ambulatory Visit (INDEPENDENT_AMBULATORY_CARE_PROVIDER_SITE_OTHER): Payer: BC Managed Care – PPO | Admitting: Internal Medicine

## 2012-02-22 ENCOUNTER — Encounter: Payer: Self-pay | Admitting: Internal Medicine

## 2012-02-22 DIAGNOSIS — I10 Essential (primary) hypertension: Secondary | ICD-10-CM

## 2012-02-22 DIAGNOSIS — F411 Generalized anxiety disorder: Secondary | ICD-10-CM

## 2012-02-22 MED ORDER — LORAZEPAM 0.5 MG PO TABS: 0.5000 mg | ORAL_TABLET | Freq: Two times a day (BID) | ORAL | Status: AC | PRN

## 2012-02-22 NOTE — Patient Instructions (Signed)
Lorazepam one in the morning and a second dose if needed late afternoon    It is important that you exercise regularly, at least 20 minutes 3 to 4 times per week.  If you develop chest pain or shortness of breath seek  medical attention.  Return in 3 months for follow-up

## 2012-02-22 NOTE — Progress Notes (Signed)
  Subjective:    Patient ID: Trevor Mack, male    DOB: [birth year], 56 y.o.   MRN: [id number]  HPI 56 year old patient has a history of the chronic anxiety. He is now being followed by behavioral health and has had the most benefit with CBT. His counselor has suggested a mild anxiolytic. The patient feels he would also benefit. He feels that he is also improved nicely on Celexa.    Review of Systems  Psychiatric/Behavioral: The patient is nervous/anxious and is hyperactive.        Objective:   Physical Exam  Constitutional: He appears well-developed and well-nourished. No distress.       Blood pressure 110/80  Psychiatric: He has a normal mood and affect. His behavior is normal. Judgment and thought content normal.          Assessment & Plan:   Anxiety disorder improved. We'll place on alprazolam 0.5 mg in the morning and a second dose when necessary Hypertension stable  Recheck 3 months Follow behavioral health

## 2012-04-15 ENCOUNTER — Telehealth: Payer: Self-pay | Admitting: Internal Medicine

## 2012-04-15 NOTE — Telephone Encounter (Signed)
Patient called stating that he went to [facility] on [address] In [place] to get his prilosec and learned that his insurance will no longer pay for it without Prior authorization. Please assist.

## 2012-05-23 ENCOUNTER — Other Ambulatory Visit: Payer: Self-pay | Admitting: Internal Medicine

## 2012-06-19 ENCOUNTER — Other Ambulatory Visit: Payer: Self-pay | Admitting: Internal Medicine

## 2012-06-28 ENCOUNTER — Other Ambulatory Visit: Payer: Self-pay

## 2012-06-28 MED ORDER — LORAZEPAM 0.5 MG PO TABS: 0.5000 mg | ORAL_TABLET | Freq: Two times a day (BID) | ORAL | Status: AC | PRN

## 2012-09-10 ENCOUNTER — Encounter: Payer: Self-pay | Admitting: Gastroenterology

## 2012-09-12 ENCOUNTER — Other Ambulatory Visit: Payer: Self-pay | Admitting: Internal Medicine

## 2012-09-23 ENCOUNTER — Other Ambulatory Visit: Payer: Self-pay | Admitting: Internal Medicine

## 2012-10-07 ENCOUNTER — Ambulatory Visit (INDEPENDENT_AMBULATORY_CARE_PROVIDER_SITE_OTHER): Payer: BC Managed Care – PPO | Admitting: Internal Medicine

## 2012-10-07 ENCOUNTER — Encounter: Payer: Self-pay | Admitting: Internal Medicine

## 2012-10-07 VITALS — BP 120/80 | Temp 98.1°F | Wt 205.0 lb

## 2012-10-07 DIAGNOSIS — L089 Local infection of the skin and subcutaneous tissue, unspecified: Secondary | ICD-10-CM

## 2012-10-07 DIAGNOSIS — L723 Sebaceous cyst: Secondary | ICD-10-CM

## 2012-10-07 MED ORDER — CEPHALEXIN 500 MG PO CAPS: 500.0000 mg | ORAL_CAPSULE | Freq: Four times a day (QID) | ORAL | Status: DC

## 2012-10-07 NOTE — Patient Instructions (Addendum)
Epidermal Cyst An epidermal cyst is sometimes called a sebaceous cyst, epidermal inclusion cyst, or infundibular cyst. These cysts usually contain a substance that looks "pasty" or "cheesy" and may have a bad smell. This substance is a protein called keratin. Epidermal cysts are usually found on the face, neck, or trunk. They may also occur in the vaginal area or other parts of the genitalia of both men and women. Epidermal cysts are usually small, painless, slow-growing bumps or lumps that move freely under the skin. It is important not to try to pop them. This may cause an infection and lead to tenderness and swelling. CAUSES   Epidermal cysts may be caused by a deep penetrating injury to the skin or a plugged hair follicle, often associated with acne. SYMPTOMS   Epidermal cysts can become inflamed and cause:  Redness.   Tenderness.   Increased temperature of the skin over the bumps or lumps.   Grayish-white, bad smelling material that drains from the bump or lump.  DIAGNOSIS   Epidermal cysts are easily diagnosed by your caregiver during an exam. Rarely, a tissue sample (biopsy) may be taken to rule out other conditions that may resemble epidermal cysts. TREATMENT    Epidermal cysts often get better and disappear on their own. They are rarely ever cancerous.   If a cyst becomes infected, it may become inflamed and tender. This may require opening and draining the cyst. Treatment with antibiotics may be necessary. When the infection is gone, the cyst may be removed with minor surgery.   Small, inflamed cysts can often be treated with antibiotics or by injecting steroid medicines.   Sometimes, epidermal cysts become large and bothersome. If this happens, surgical removal in your caregiver's office may be necessary.  HOME CARE INSTRUCTIONS  Only take over-the-counter or prescription medicines as directed by your caregiver.   Take your antibiotics as directed. Finish them even if you  start to feel better.  SEEK MEDICAL CARE IF:    Your cyst becomes tender, red, or swollen.   Your condition is not improving or is getting worse.   You have any other questions or concerns.  MAKE SURE YOU:  Understand these instructions.   Will watch your condition.   Will get help right away if you are not doing well or get worse.  Document Released: 11/11/2004 Document Revised: 03/04/2012 Document Reviewed: 06/19/2011 [facility] Patient Information 2013 [place], [place].   Take your antibiotic as prescribed until ALL of it is gone, but stop if you develop a rash, swelling, or any side effects of the medication.  Contact our office as soon as possible if  there are side effects of the medication.

## 2012-10-07 NOTE — Progress Notes (Signed)
  Subjective:    Patient ID: Trevor Mack, male    DOB: [birth year], 56 y.o.   MRN: [id number]  HPI  56 year old patient who presents with a several day history of a large seen painful nodule involving the right mid back area.    Review of Systems  Constitutional: Negative for fever and chills.  Skin: Positive for rash.       Objective:   Physical Exam  Skin:       3 cm mildly fluctuant erythematous nodule right mid back area          Assessment & Plan:    Inflamed sebaceous cyst.  Procedure note.  The area was cleansed with Betadine and alcohol and anesthetized with 1% Xylocaine. A 1.5 cm incision made vertically in the central aspect of the nodule. A modest amount of exudate return. Blunt dissection was performed with little additional exudate. Antibiotic ointment applied and the wound dressed. Wound care discussed The patient was also treated with 10 days of cephalexin due to  associated cellulitis

## 2012-10-21 ENCOUNTER — Other Ambulatory Visit: Payer: Self-pay | Admitting: Internal Medicine

## 2012-10-21 NOTE — Telephone Encounter (Signed)
Last seen 10-14 -13 cyct on neck Last written 09/12/12 # 60  0Rf Please advise

## 2012-10-21 NOTE — Telephone Encounter (Signed)
ok 

## 2012-10-22 ENCOUNTER — Ambulatory Visit (INDEPENDENT_AMBULATORY_CARE_PROVIDER_SITE_OTHER): Payer: BC Managed Care – PPO | Admitting: Internal Medicine

## 2012-10-22 ENCOUNTER — Encounter: Payer: Self-pay | Admitting: Internal Medicine

## 2012-10-22 VITALS — BP 124/90 | Temp 97.9°F | Wt 204.0 lb

## 2012-10-22 DIAGNOSIS — I10 Essential (primary) hypertension: Secondary | ICD-10-CM

## 2012-10-22 DIAGNOSIS — G5600 Carpal tunnel syndrome, unspecified upper limb: Secondary | ICD-10-CM

## 2012-10-22 NOTE — Patient Instructions (Signed)

## 2012-10-22 NOTE — Telephone Encounter (Signed)
Called in.

## 2012-10-22 NOTE — Progress Notes (Signed)
Subjective:    Patient ID: Trevor Mack, male    DOB: 1956/10/23, 56 y.o.   MRN: [id number]  HPI  56 year old patient who presents with a 3 to four-week history of numbness involving his left arm. His father died of a heart attack after several days of left arm pain. The patient is left-handed and works as an Personnel officer and performs a repetitious type the activities throughout the day. Pain seems more aggravating at night.  Past Medical History  Diagnosis Date  . ANXIETY 07/23/2008  . COLONIC POLYPS, HX OF 07/23/2008  . GERD 07/23/2008  . HYPERLIPIDEMIA 07/23/2008  . HYPERTENSION 07/23/2008  . TENDINITIS, LEFT THUMB 07/15/2010    History   Social History  . Marital Status: Married    Spouse Name: N/A    Number of Children: N/A  . Years of Education: N/A   Occupational History  . Not on file.   Social History Main Topics  . Smoking status: Current Every Day Smoker -- 2.0 packs/day    Types: Cigarettes  . Smokeless tobacco: Never Used  . Alcohol Use: No  . Drug Use: No  . Sexually Active: Not on file   Other Topics Concern  . Not on file   Social History Narrative  . No narrative on file    Past Surgical History  Procedure Date  . Lumbar laminectomy     No family history on file.  No Known Allergies  Current Outpatient Prescriptions on File Prior to Visit  Medication Sig Dispense Refill  . citalopram (CELEXA) 40 MG tablet TAKE ONE TABLET BY MOUTH EVERY DAY  30 tablet  5  . lisinopril (PRINIVIL,ZESTRIL) 10 MG tablet TAKE ONE TABLET BY MOUTH EVERY DAY  90 tablet  1  . LORazepam (ATIVAN) 0.5 MG tablet Take 1 tablet (0.5 mg total) by mouth 2 (two) times daily as needed for anxiety.  60 tablet  0  . omeprazole (PRILOSEC) 20 MG capsule TAKE ONE CAPSULE BY MOUTH EVERY DAY  90 capsule  1  . pravastatin (PRAVACHOL) 40 MG tablet TAKE ONE TABLET BY MOUTH EVERY DAY  90 tablet  1    BP 124/90  Temp 97.9 F (36.6 C) (Oral)  Wt 204 lb (92.534 kg)       Review of  Systems  Constitutional: Negative for fever, chills, appetite change and fatigue.  HENT: Negative for hearing loss, ear pain, congestion, sore throat, trouble swallowing, neck stiffness, dental problem, voice change and tinnitus.   Eyes: Negative for pain, discharge and visual disturbance.  Respiratory: Negative for cough, chest tightness, wheezing and stridor.   Cardiovascular: Negative for chest pain, palpitations and leg swelling.  Gastrointestinal: Negative for nausea, vomiting, abdominal pain, diarrhea, constipation, blood in stool and abdominal distention.  Genitourinary: Negative for urgency, hematuria, flank pain, discharge, difficulty urinating and genital sores.  Musculoskeletal: Negative for myalgias, back pain, joint swelling, arthralgias and gait problem.  Skin: Negative for rash.  Neurological: Positive for numbness. Negative for dizziness, syncope, speech difficulty, weakness and headaches.  Hematological: Negative for adenopathy. Does not bruise/bleed easily.  Psychiatric/Behavioral: Negative for behavioral problems and dysphoric mood. The patient is not nervous/anxious.        Objective:   Physical Exam  Constitutional: He appears well-developed and well-nourished. No distress.  Neurological:       Tinel's though weakly positive Normal grip strength Reflexes intact No motor weakness          Assessment & Plan:   Probable mild carpal tunnel  syndrome. This was discussed. Symptoms are quite mild and he was really concerned about a possible cardiac etiology. He was reassured. He will consider anti-inflammatories or possibly a nocturnal wrist splint if symptoms worsen

## 2012-11-07 ENCOUNTER — Encounter: Payer: Self-pay | Admitting: *Deleted

## 2012-11-12 ENCOUNTER — Encounter: Payer: Self-pay | Admitting: Gastroenterology

## 2012-11-12 ENCOUNTER — Ambulatory Visit: Payer: BC Managed Care – PPO | Admitting: Gastroenterology

## 2012-11-12 ENCOUNTER — Ambulatory Visit (INDEPENDENT_AMBULATORY_CARE_PROVIDER_SITE_OTHER): Payer: BC Managed Care – PPO | Admitting: Gastroenterology

## 2012-11-12 VITALS — BP 130/76 | HR 109 | Ht 69.0 in | Wt 206.2 lb

## 2012-11-12 DIAGNOSIS — K219 Gastro-esophageal reflux disease without esophagitis: Secondary | ICD-10-CM

## 2012-11-12 DIAGNOSIS — Z800 Family history of malignant neoplasm of digestive organs: Secondary | ICD-10-CM | POA: Insufficient documentation

## 2012-11-12 DIAGNOSIS — Z86010 Personal history of colonic polyps: Secondary | ICD-10-CM

## 2012-11-12 MED ORDER — MOVIPREP 100 G PO SOLR: 1.0000 | Freq: Once | ORAL | Status: DC

## 2012-11-12 NOTE — Patient Instructions (Addendum)
You have been scheduled for an endoscopy and colonoscopy with propofol. Please follow the written instructions given to you at your visit today. Please pick up your prep at the pharmacy within the next 1-3 days. If you use inhalers (even only as needed) or a CPAP machine, please bring them with you on the day of your procedure. 

## 2012-11-12 NOTE — Progress Notes (Signed)
11/12/2012 Trevor Mack [id number] 04/04/1956   HISTORY OF PRESENT ILLNESS:  Patient is a 56 year old male who presents to the office today for a couple of reasons.  First, he is well overdue for surveillance colonoscopy.  Last colonoscopy was in 2008 at which time he was found to have several adenomatous polyps.  He also has a family history of colon cancer in his father, therefore, it was recommended that he have a colonoscopy in 2 years from the last.    There are no changes with his bowels, however, he does continue with loose bowel movements about 3 times  A day (after every time that he eats), which has been a long-standing issue.  No blood in stool.  No abdominal pain.  Next, he complains of intermittent discomfort/pressure in his chest every couple of months or so.  He says that he was checked by his PCP and they told him that it was not his heart, but rather probably gas pains.  He says that he will make himself belch, which relieves the pressure.  He has chronic GERD for which he has been taking omeprazole for several years with overall good control of his symptoms.  Denies heartburn/reflux.  No esophageal dysphagia, but does admit to food occasionally getting stuck in his throat.  When this occurs he will usually cough and it comes back up.  Once again, this is a very intermittent issue.  No weight loss, in fact he has weight gain.  Last EGD was in 1999 at which time he had some gastritis.    Past Medical History  Diagnosis Date  . ANXIETY 07/23/2008  . COLONIC POLYPS, HX OF 03/26/2006    MULTIPLE FRAGMENTS OF TUBULAR ADENOMAS POLYPS  . GERD 07/23/2008  . HYPERLIPIDEMIA 07/23/2008  . HYPERTENSION 07/23/2008  . TENDINITIS, LEFT THUMB 07/15/2010  . Family history of colon cancer     Father    Past Surgical History  Procedure Date  . Lumbar laminectomy     reports that he has been smoking Cigarettes.  He has been smoking about 2 packs per day. He has never used smokeless tobacco. He  reports that he does not drink alcohol or use illicit drugs. family history includes Colon cancer in his father; Diabetes in his father; Heart attack in his father; and Hypertension in his father. No Known Allergies    Outpatient Encounter Prescriptions as of 11/12/2012  Medication Sig Dispense Refill  . citalopram (CELEXA) 40 MG tablet TAKE ONE TABLET BY MOUTH EVERY DAY  30 tablet  5  . lisinopril (PRINIVIL,ZESTRIL) 10 MG tablet TAKE ONE TABLET BY MOUTH EVERY DAY  90 tablet  1  . LORazepam (ATIVAN) 0.5 MG tablet Take 1 tablet (0.5 mg total) by mouth 2 (two) times daily as needed for anxiety.  60 tablet  0  . omeprazole (PRILOSEC) 20 MG capsule TAKE ONE CAPSULE BY MOUTH EVERY DAY  90 capsule  1  . pravastatin (PRAVACHOL) 40 MG tablet TAKE ONE TABLET BY MOUTH EVERY DAY  90 tablet  1  . MOVIPREP 100 G SOLR Take 1 kit (100 g total) by mouth once.  1 kit  0     REVIEW OF SYSTEMS  : All other systems reviewed and negative except where noted in the History of Present Illness.   PHYSICAL EXAM: BP 130/76  Pulse 109  Ht 5\' 9"  (1.753 m)  Wt 206 lb 4 oz (93.554 kg)  BMI 30.46 kg/m2  SpO2 97% General: Well  developed white male in no acute distress Head: Normocephalic and atraumatic Eyes:  sclerae anicteric,conjunctive pink. Ears: Normal auditory acuity Neck: Supple, no masses.  Lungs: Clear throughout to auscultation. Heart: Tachy, but regular rhythm.  No M/R/G. Abdomen: Soft, obese, nontender, non-distended. No masses or hepatomegaly noted. Normal bowel sounds. Rectal: Deferred. Musculoskeletal: Symmetrical with no gross deformities  Skin: No lesions on visible extremities Extremities: No edema  Neurological: Alert oriented x 4, grossly nonfocal Cervical Nodes:  No significant cervical adenopathy Psychological:  Alert and cooperative. Normal mood and affect  ASSESSMENT AND PLAN: 1)  Personal history of adenomatous colonic polyps 2)  Family history of colon cancer in father 3)   Long-standing GERD  *Will schedule for surveillance colonoscopy. *Will schedule for EGD as well due to long-standing symptoms.  Will obtain small bowel biopsies to rule out celiac due to long-standing loose bowels. *Continue daily PPI as he has been taking it.

## 2012-11-22 ENCOUNTER — Other Ambulatory Visit: Payer: Self-pay | Admitting: Internal Medicine

## 2012-11-25 ENCOUNTER — Telehealth: Payer: Self-pay | Admitting: Gastroenterology

## 2012-11-25 ENCOUNTER — Encounter: Payer: BC Managed Care – PPO | Admitting: Gastroenterology

## 2012-11-25 MED ORDER — NA SULFATE-K SULFATE-MG SULF 17.5-3.13-1.6 GM/177ML PO SOLN: ORAL | Status: DC

## 2012-11-25 MED ORDER — SOD PICOSULFATE-MAG OX-CIT ACD 10-3.5-12 MG-GM-GM PO PACK: 1.0000 | PACK | Freq: Once | ORAL | Status: DC

## 2012-11-25 NOTE — Telephone Encounter (Signed)
Pt reports he was unable to do the prep for today's procedure. He reports he drank the 1st glass of Movi Prep and immediately threw it up. This is his 3rd COLON and he reports problems with each prep; the other 2 pt used Golytely. Spoke with Dr Jarold Motto who stated to try to r/s the procedures and give samples of Su Prep and PrepoPik. Pt will call to see if he can be Wednesday, 11/27/12.  Scheduled pt for 11/27/12. Left prep kits with instructions for both pres and instructed pt to try one today to see if he can tolerate the liquid; if not he needs to call me today. Informed him he can eat today once he finds a prep; pt stated understanding.

## 2012-11-27 ENCOUNTER — Ambulatory Visit (AMBULATORY_SURGERY_CENTER): Payer: BC Managed Care – PPO | Admitting: Gastroenterology

## 2012-11-27 ENCOUNTER — Telehealth: Payer: Self-pay | Admitting: Gastroenterology

## 2012-11-27 ENCOUNTER — Encounter: Payer: Self-pay | Admitting: Gastroenterology

## 2012-11-27 VITALS — BP 139/74 | HR 99 | Temp 98.8°F | Resp 19 | Ht 69.0 in | Wt 206.0 lb

## 2012-11-27 DIAGNOSIS — Z86010 Personal history of colonic polyps: Secondary | ICD-10-CM

## 2012-11-27 DIAGNOSIS — D126 Benign neoplasm of colon, unspecified: Secondary | ICD-10-CM

## 2012-11-27 DIAGNOSIS — K5730 Diverticulosis of large intestine without perforation or abscess without bleeding: Secondary | ICD-10-CM

## 2012-11-27 DIAGNOSIS — K219 Gastro-esophageal reflux disease without esophagitis: Secondary | ICD-10-CM

## 2012-11-27 DIAGNOSIS — Z800 Family history of malignant neoplasm of digestive organs: Secondary | ICD-10-CM

## 2012-11-27 HISTORY — PX: COLONOSCOPY: SHX174

## 2012-11-27 MED ORDER — ESOMEPRAZOLE MAGNESIUM 40 MG PO CPDR: 40.0000 mg | DELAYED_RELEASE_CAPSULE | Freq: Every day | ORAL | Status: DC

## 2012-11-27 MED ORDER — FLEET ENEMA 7-19 GM/118ML RE ENEM
1.0000 | ENEMA | Freq: Once | RECTAL | Status: AC
  Administered 2012-11-27: 1 via RECTAL

## 2012-11-27 MED ORDER — SODIUM CHLORIDE 0.9 % IV SOLN: 500.0000 mL | INTRAVENOUS | Status: DC

## 2012-11-27 NOTE — Progress Notes (Signed)
Results from second enema, clear yellow with sediment.

## 2012-11-27 NOTE — Progress Notes (Signed)
Results from Fleet, yellow liquid and solid small stool. Discussed with Dr. Jarold Motto, second enema ordered and self administered.

## 2012-11-27 NOTE — Op Note (Signed)
[facility] [address] [place], [address]   ENDOSCOPY PROCEDURE REPORT  PATIENT: Trevor Mack, Trevor Mack  MR#: [id number] BIRTHDATE: 1956-10-20 , 56  yrs. old GENDER: Male ENDOSCOPIST:Elzie Knisley Hale Bogus, MD, [facility] REFERRED BY: PROCEDURE DATE:  11/27/2012 PROCEDURE:   Esophagoscopy ASA CLASS:    Class II INDICATIONS: history of esophageal reflux. MEDICATION: There was residual sedation effect present from prior procedure and propofol (Diprivan) 100mg  IV TOPICAL ANESTHETIC:  DESCRIPTION OF PROCEDURE:   After the risks and benefits of the procedure were explained, informed consent was obtained.  The LB [id number]-H180 [id number]  endoscope was introduced through the mouth  and advanced to the esophagus lower .  The instrument was slowly withdrawn as the mucosa was fully examined.    Severe respiratory spasm...incomplete exam....could not see Barrett's or obstruction.          The scope was then withdrawn from the patient and the procedure completed.  COMPLICATIONS: Stridor occurred   ENDOSCOPIC IMPRESSION: Severe respiratory spasm...incomplete exam....could not see Barrett's or obstruction...  RECOMMENDATIONS: 1.  Continue PPI 2.  Anti-reflux regimen to be follow 3. Consider UGI series   _______________________________ eSigned:  Mardella Layman, MD, [facility] 11/27/2012 2:54 PM   standard discharge

## 2012-11-27 NOTE — Progress Notes (Signed)
Patient did not experience any of the following events: a burn prior to discharge; a fall within the facility; wrong site/side/patient/procedure/implant event; or a hospital transfer or hospital admission upon discharge from the facility. (G8907) Patient did not have preoperative order for IV antibiotic SSI prophylaxis. (G8918)  

## 2012-11-27 NOTE — Telephone Encounter (Signed)
Returned patient's call and he states that he is unable to keep prep down this morning and was not able to keep any of it down.  He was able to keep yesterday's prep down.  He states that his stool was clear liquid yesterday but has not had a BM this morning as yet.  I advised him to come on in an hour before procedure time as scheduled and we will assess him when he gets here.  If necessary, we will give him a Fleets Enema. Patient was in agreement and all questions were answered.

## 2012-11-27 NOTE — Patient Instructions (Addendum)

## 2012-11-27 NOTE — Progress Notes (Signed)
Patient stating he was unable to retain any of the Moviprep this morning. Patient stating he took all of the prep last evening and results were clear  In color. Fleets enema given with patient administering.

## 2012-11-27 NOTE — Op Note (Signed)
[facility] [address] [place], [address]   COLONOSCOPY PROCEDURE REPORT  PATIENT: Trevor Mack, Trevor Mack  MR#: [id number] BIRTHDATE: Jan 28, 1956 , 56  yrs. old GENDER: Male ENDOSCOPIST: Mardella Layman, MD, [facility] REFERRED BY: PROCEDURE DATE:  11/27/2012 PROCEDURE:   Colonoscopy with snare polypectomy ASA CLASS:   Class II INDICATIONS:Patient's personal history of colon polyps. MEDICATIONS: propofol (Diprivan) 300mg  IV  DESCRIPTION OF PROCEDURE:   After the risks and benefits and of the procedure were explained, informed consent was obtained.  A digital rectal exam revealed no abnormalities of the rectum.    The LB [id number]  endoscope was introduced through the anus and advanced to the cecum, which was identified by both the appendix and ileocecal valve .  The quality of the prep was poor, using MoviPrep .  The instrument was then slowly withdrawn as the colon was fully examined.     COLON FINDINGS: A diminutive small sessile polyp was found at the cecum.  A polypectomy was performed using snare cautery.  The resection was complete and the polyp tissue was not retrieved. Mild diverticulosis was noted in the descending colon and sigmoid colon.     Retroflexed views revealed no abnormalities.     The scope was then withdrawn from the patient and the procedure completed.  COMPLICATIONS: There were no complications. ENDOSCOPIC IMPRESSION: 1.   Diminutive small sessile polyp was found at the cecum; polypectomy was performed using snare cautery ,destroyed with cautery. 2.   Diverticulosis  RECOMMENDATIONS: 1.  High fiber diet 2.  Repeat Colonoscopy in 5 years.   REPEAT EXAM:  cc:  _______________________________ eSignedMardella Layman, MD, [facility] 11/27/2012 2:42 PM

## 2012-11-28 ENCOUNTER — Telehealth: Payer: Self-pay | Admitting: *Deleted

## 2012-11-28 ENCOUNTER — Telehealth: Payer: Self-pay

## 2012-11-28 DIAGNOSIS — F17210 Nicotine dependence, cigarettes, uncomplicated: Secondary | ICD-10-CM

## 2012-11-28 DIAGNOSIS — J9801 Acute bronchospasm: Secondary | ICD-10-CM

## 2012-11-28 NOTE — Telephone Encounter (Signed)
  Follow up Call-  Call back number 11/27/2012  Post procedure Call Back phone  # [phone]  Permission to leave phone message Yes     Patient questions:  Do you have a fever, pain , or abdominal swelling? no Pain Score  0 *  Have you tolerated food without any problems? yes  Have you been able to return to your normal activities? yes  Do you have any questions about your discharge instructions: Diet   no Medications  no Follow up visit  no  Do you have questions or concerns about your Care? no  Actions: * If pain score is 4 or above: No action needed, pain <4.

## 2012-11-28 NOTE — Telephone Encounter (Signed)
Per Dr Jarold Motto, please order a Pulmonology consult for pt who was unable to complete an EGD d/y severe respiratory spasm and stridor. Pt scheduled to see Dr Delford Field on 12/10/12 at 4pm. lmom for pt to call me back.

## 2012-11-28 NOTE — Telephone Encounter (Signed)
Informed pt of his appt with Dr Delford Field; pt stated understanding.

## 2012-11-29 ENCOUNTER — Telehealth: Payer: Self-pay | Admitting: Gastroenterology

## 2012-11-29 NOTE — Telephone Encounter (Signed)
Pt reports he needs a note from work. He states he prepped Sunday thru Wednesday, procedure was Wednesday, 11/27/12 and he has been sick ever since. Faxed a letter to [employer] at [phone]

## 2012-12-10 ENCOUNTER — Encounter: Payer: Self-pay | Admitting: Critical Care Medicine

## 2012-12-10 ENCOUNTER — Ambulatory Visit (INDEPENDENT_AMBULATORY_CARE_PROVIDER_SITE_OTHER): Payer: BC Managed Care – PPO | Admitting: Critical Care Medicine

## 2012-12-10 ENCOUNTER — Ambulatory Visit (INDEPENDENT_AMBULATORY_CARE_PROVIDER_SITE_OTHER)
Admission: RE | Admit: 2012-12-10 | Discharge: 2012-12-10 | Disposition: A | Discharge status: Home / Self Care | Payer: BC Managed Care – PPO | Source: Ambulatory Visit | Attending: Critical Care Medicine | Admitting: Critical Care Medicine

## 2012-12-10 ENCOUNTER — Other Ambulatory Visit: Payer: BC Managed Care – PPO

## 2012-12-10 VITALS — BP 140/80 | HR 84 | Temp 98.6°F | Ht 69.0 in | Wt 205.2 lb

## 2012-12-10 DIAGNOSIS — J441 Chronic obstructive pulmonary disease with (acute) exacerbation: Secondary | ICD-10-CM

## 2012-12-10 DIAGNOSIS — F17200 Nicotine dependence, unspecified, uncomplicated: Secondary | ICD-10-CM

## 2012-12-10 DIAGNOSIS — R059 Cough, unspecified: Secondary | ICD-10-CM

## 2012-12-10 DIAGNOSIS — R05 Cough: Secondary | ICD-10-CM

## 2012-12-10 DIAGNOSIS — Z720 Tobacco use: Secondary | ICD-10-CM

## 2012-12-10 MED ORDER — TIOTROPIUM BROMIDE MONOHYDRATE 18 MCG IN CAPS: 18.0000 ug | ORAL_CAPSULE | Freq: Every day | RESPIRATORY_TRACT | Status: DC

## 2012-12-10 MED ORDER — PREDNISONE 10 MG PO TABS: 10.0000 mg | ORAL_TABLET | Freq: Every day | ORAL | Status: DC

## 2012-12-10 MED ORDER — LOSARTAN POTASSIUM 50 MG PO TABS: 50.0000 mg | ORAL_TABLET | Freq: Every day | ORAL | Status: DC

## 2012-12-10 MED ORDER — NICOTINE 10 MG IN INHA: 1.0000 | RESPIRATORY_TRACT | Status: DC | PRN

## 2012-12-10 NOTE — Patient Instructions (Addendum)
Start Spiriva daily Take prednisone 10mg  Take 4 tablets daily for 5 days then stop Stop lisinopril  Start losartan 50mg  daily Strict reflux diet Chest xray today You need to stop smoking, use nicotrol inhaler Return 6 weeks

## 2012-12-10 NOTE — Progress Notes (Signed)
Subjective:    Patient ID: Trevor Mack, male    DOB: 07/24/1956, 56 y.o.   MRN: [id number]  HPI Comments: Hx of pulm issus brought on by EGD.  Chronic cough x one year.    Cough This is a chronic problem. The current episode started more than 1 year ago. The problem has been unchanged (if pt laughs then coughs cannot breath). The problem occurs every few hours. The cough is productive of purulent sputum. Associated symptoms include headaches, heartburn, nasal congestion, postnasal drip and wheezing. Pertinent negatives include no chest pain, chills, ear congestion, ear pain, eye redness, hemoptysis, myalgias, rhinorrhea, sore throat, shortness of breath, sweats or weight loss. Associated symptoms comments: occ dysphagia. The symptoms are aggravated by lying down (eating ppt cough, lots of qhs coughing, food sticks in throat). Risk factors for lung disease include smoking/tobacco exposure. He has tried nothing for the symptoms. His past medical history is significant for pneumonia. There is no history of asthma, bronchiectasis, bronchitis, COPD, emphysema or environmental allergies. PNA 83yrs ago    Past Medical History  Diagnosis Date  . ANXIETY 07/23/2008  . COLONIC POLYPS, HX OF 03/26/2006    MULTIPLE FRAGMENTS OF TUBULAR ADENOMAS POLYPS  . GERD 07/23/2008  . HYPERLIPIDEMIA 07/23/2008  . HYPERTENSION 07/23/2008  . TENDINITIS, LEFT THUMB 07/15/2010  . Family history of colon cancer     Father      Family History  Problem Relation Age of Onset  . Colon cancer Father   . Hypertension Father   . Diabetes Father   . Heart attack Father      History   Social History  . Marital Status: Married    Spouse Name: N/A    Number of Children: 0  . Years of Education: N/A   Occupational History  .  [employer]   Social History Main Topics  . Smoking status: Current Every Day Smoker -- 2.0 packs/day for 30 years    Types: Cigarettes  . Smokeless tobacco: Never Used  . Alcohol  Use: No  . Drug Use: No  . Sexually Active: Not on file   Other Topics Concern  . Not on file   Social History Narrative  . No narrative on file     No Known Allergies   Outpatient Prescriptions Prior to Visit  Medication Sig Dispense Refill  . citalopram (CELEXA) 40 MG tablet TAKE ONE TABLET BY MOUTH EVERY DAY  30 tablet  5  . esomeprazole (NEXIUM) 40 MG capsule Take 1 capsule (40 mg total) by mouth daily before breakfast.  30 capsule  11  . LORazepam (ATIVAN) 0.5 MG tablet TAKE ONE TABLET BY MOUTH TWICE DAILY AS NEEDED  60 tablet  0  . pravastatin (PRAVACHOL) 40 MG tablet TAKE ONE TABLET BY MOUTH EVERY DAY  90 tablet  1  . [DISCONTINUED] lisinopril (PRINIVIL,ZESTRIL) 10 MG tablet TAKE ONE TABLET BY MOUTH EVERY DAY  90 tablet  1  . [DISCONTINUED] MOVIPREP 100 G SOLR Take 1 kit (100 g total) by mouth once.  1 kit  0  . [DISCONTINUED] Na Sulfate-K Sulfate-Mg Sulf SOLN Take as directed with attached instructions.  354 mL  1  . [DISCONTINUED] omeprazole (PRILOSEC) 20 MG capsule TAKE ONE CAPSULE BY MOUTH EVERY DAY  90 capsule  1  . [DISCONTINUED] Sod Picosulfate-Mag Ox-Cit Acd 10-3.5-12 MG-GM-GM PACK Take 1 Package by mouth once.  1 each  1  Last reviewed on 12/10/2012  4:17 PM by Storm Frisk,  MD    Review of Systems  Constitutional: Negative for chills, weight loss and unexpected weight change.  HENT: Positive for congestion, sneezing, dental problem and postnasal drip. Negative for ear pain, nosebleeds, sore throat, rhinorrhea, trouble swallowing and sinus pressure.   Eyes: Negative for redness and itching.  Respiratory: Positive for cough, choking (when takes a coughing spell) and wheezing. Negative for hemoptysis, chest tightness and shortness of breath.   Cardiovascular: Negative for chest pain and palpitations.  Gastrointestinal: Positive for heartburn. Negative for nausea.  Genitourinary: Negative for dysuria.  Musculoskeletal: Negative for myalgias and joint swelling.   Neurological: Positive for headaches.  Hematological: Negative for environmental allergies. Does not bruise/bleed easily.  Psychiatric/Behavioral: Positive for dysphoric mood. The patient is nervous/anxious.        Objective:   Physical Exam Filed Vitals:   12/10/12 1601  BP: 140/80  Pulse: 84  Temp: 98.6 F (37 C)  TempSrc: Oral  Height: 5\' 9"  (1.753 m)  Weight: 205 lb 3.2 oz (93.078 kg)  SpO2: 90%    Gen: Pleasant, well-nourished, in no distress,  normal affect  ENT: No lesions,  mouth clear,  oropharynx clear, no postnasal drip  Neck: No JVD, no TMG, no carotid bruits  Lungs: No use of accessory muscles, no dullness to percussion, distant BS, exp wheezes  Cardiovascular: RRR, heart sounds normal, no murmur or gallops, no peripheral edema  Abdomen: soft and NT, no HSM,  BS normal  Musculoskeletal: No deformities, no cyanosis or clubbing  Neuro: alert, non focal  Skin: Warm, no lesions or rashes  Chest x-ray shows flattened diaphragms and probable early emphysema 12/10/2012 spirometry reveals FEV1 59% predicted FVC 70% predicted FEV1 FVC ratio 68% predicted FEF 25 7537% predicted Note alpha-1 antitrypsin level normal on 12/10/2012      Assessment & Plan:   Chronic obstructive lung disease with acute on chronic bronchitis and exacerbation gold stage C Chronic obstructive lung disease with acute on chronic bronchitis with exacerbation gold stage C. Ongoing reflux disease is also documented Ongoing tobacco use is documented Recent upper endoscopy attempt failed due to acute bronchospasm There is elements of upper airway instability in this patient due to ACE inhibitor use Significant allergic rhinitis is also playing a role Chest x-ray shows flattened diaphragms and probable early emphysema 12/10/2012 spirometry reveals FEV1 59% predicted FVC 70% predicted FEV1 FVC ratio 68% predicted FEF 25 7537% predicted Note alpha-1 antitrypsin level normal on  12/10/2012  Plan Start Spiriva daily Take prednisone 10mg  Take 4 tablets daily for 5 days then stop Stop lisinopril  Start losartan 50mg  daily Strict reflux diet Greater than 10 minutes of smoking cessation counseling was issued to this patient   Nicotrol inhaler was prescribed for smoking cessation   Tobacco use Ongoing tobacco use as of 12/10/2012 noted Greater than 10 minutes of smoking cessation counseling was issued the patient Nicotrol as prescribed    Updated Medication List Outpatient Encounter Prescriptions as of 12/10/2012  Medication Sig Dispense Refill  . citalopram (CELEXA) 40 MG tablet TAKE ONE TABLET BY MOUTH EVERY DAY  30 tablet  5  . esomeprazole (NEXIUM) 40 MG capsule Take 1 capsule (40 mg total) by mouth daily before breakfast.  30 capsule  11  . LORazepam (ATIVAN) 0.5 MG tablet TAKE ONE TABLET BY MOUTH TWICE DAILY AS NEEDED  60 tablet  0  . pravastatin (PRAVACHOL) 40 MG tablet TAKE ONE TABLET BY MOUTH EVERY DAY  90 tablet  1  . [DISCONTINUED] lisinopril (PRINIVIL,ZESTRIL)  10 MG tablet TAKE ONE TABLET BY MOUTH EVERY DAY  90 tablet  1  . losartan (COZAAR) 50 MG tablet Take 1 tablet (50 mg total) by mouth daily.  30 tablet  6  . nicotine (NICOTROL) 10 MG inhaler Inhale 1 puff into the lungs as needed for smoking cessation.  168 each  2  . predniSONE (DELTASONE) 10 MG tablet Take 1 tablet (10 mg total) by mouth daily.  20 tablet  0  . tiotropium (SPIRIVA HANDIHALER) 18 MCG inhalation capsule Place 1 capsule (18 mcg total) into inhaler and inhale daily.  30 capsule  2  . [DISCONTINUED] MOVIPREP 100 G SOLR Take 1 kit (100 g total) by mouth once.  1 kit  0  . [DISCONTINUED] Na Sulfate-K Sulfate-Mg Sulf SOLN Take as directed with attached instructions.  354 mL  1  . [DISCONTINUED] omeprazole (PRILOSEC) 20 MG capsule TAKE ONE CAPSULE BY MOUTH EVERY DAY  90 capsule  1  . [DISCONTINUED] Sod Picosulfate-Mag Ox-Cit Acd 10-3.5-12 MG-GM-GM PACK Take 1 Package by mouth once.  1  each  1

## 2012-12-11 DIAGNOSIS — Z720 Tobacco use: Secondary | ICD-10-CM | POA: Insufficient documentation

## 2012-12-11 LAB — ALPHA-1-ANTITRYPSIN: A-1 Antitrypsin, Ser: 139 mg/dL (ref 90–200)

## 2012-12-11 NOTE — Progress Notes (Signed)
Quick Note:  Call pt and tell him alpha one antitrypsin levels were normal ______

## 2012-12-11 NOTE — Assessment & Plan Note (Addendum)
Chronic obstructive lung disease with acute on chronic bronchitis with exacerbation gold stage C. Ongoing reflux disease is also documented Ongoing tobacco use is documented Recent upper endoscopy attempt failed due to acute bronchospasm There is elements of upper airway instability in this patient due to ACE inhibitor use Significant allergic rhinitis is also playing a role Chest x-ray shows flattened diaphragms and probable early emphysema 12/10/2012 spirometry reveals FEV1 59% predicted FVC 70% predicted FEV1 FVC ratio 68% predicted FEF 25 7537% predicted Note alpha-1 antitrypsin level normal on 12/10/2012  Plan Start Spiriva daily Take prednisone 10mg  Take 4 tablets daily for 5 days then stop Stop lisinopril  Start losartan 50mg  daily Strict reflux diet Greater than 10 minutes of smoking cessation counseling was issued to this patient   Nicotrol inhaler was prescribed for smoking cessation

## 2012-12-11 NOTE — Progress Notes (Signed)
Quick Note:  Called, spoke with pt. Informed him of lab results per Dr. Wright. He verbalized understanding and voiced no further questions or concerns at this time. ______ 

## 2012-12-11 NOTE — Assessment & Plan Note (Signed)
Ongoing tobacco use as of 12/10/2012 noted Greater than 10 minutes of smoking cessation counseling was issued the patient Nicotrol as prescribed

## 2012-12-14 LAB — ALPHA-1 ANTITRYPSIN PHENOTYPE: A-1 Antitrypsin: 124 mg/dL (ref 83–199)

## 2012-12-16 ENCOUNTER — Other Ambulatory Visit: Payer: Self-pay | Admitting: Internal Medicine

## 2012-12-20 ENCOUNTER — Telehealth: Payer: Self-pay | Admitting: Critical Care Medicine

## 2012-12-20 NOTE — Telephone Encounter (Signed)
Tell him cxr showed lungs are clear. There is moderate emphysema and he needs to stay off cigarettes

## 2012-12-20 NOTE — Telephone Encounter (Signed)
Spoke with patient, patient states he tried to pick up rx for losartan and breathing medication (spiriva) but pharmacy stated they did not have anything.  Spoke with pharmacy and they have stated that medication were "on hold."  Pharmacy tech has taken medications off hold and I have informed patient that medications are able to picked up now.  Patient is also requesting results from chest xray.  According to note on CXR report patient is aware of these results, however patient states he does not know.  Dr. Delford Field please advise of cxr results for patient from 12/11/12. Thank you

## 2012-12-20 NOTE — Telephone Encounter (Signed)
LMTCB

## 2012-12-23 NOTE — Telephone Encounter (Signed)
LMTCB x 1 

## 2012-12-23 NOTE — Telephone Encounter (Signed)
Pt returned call; advised of cxr results / recs as stated by PW below.  Pt verbalized his understanding and denied any questions.  Will sign off.

## 2012-12-30 ENCOUNTER — Other Ambulatory Visit: Payer: Self-pay | Admitting: Internal Medicine

## 2013-01-22 ENCOUNTER — Encounter: Payer: Self-pay | Admitting: Critical Care Medicine

## 2013-01-22 ENCOUNTER — Ambulatory Visit (INDEPENDENT_AMBULATORY_CARE_PROVIDER_SITE_OTHER): Payer: BC Managed Care – PPO | Admitting: Critical Care Medicine

## 2013-01-22 VITALS — BP 138/70 | HR 89 | Temp 97.8°F | Ht 69.0 in | Wt 210.4 lb

## 2013-01-22 DIAGNOSIS — J441 Chronic obstructive pulmonary disease with (acute) exacerbation: Secondary | ICD-10-CM

## 2013-01-22 MED ORDER — CEFUROXIME AXETIL 500 MG PO TABS: 500.0000 mg | ORAL_TABLET | Freq: Two times a day (BID) | ORAL | Status: DC

## 2013-01-22 MED ORDER — METHYLPREDNISOLONE ACETATE 80 MG/ML IJ SUSP
120.0000 mg | Freq: Once | INTRAMUSCULAR | Status: AC
  Administered 2013-01-22: 120 mg via INTRAMUSCULAR

## 2013-01-22 NOTE — Patient Instructions (Signed)
A depomedrol injection was given 120mg   Take ceftin 500mg  twice daily for 7 days  Continue Spiriva Finish prednisone Continue to stop smoking with nicotrol inhaler Return 2 months

## 2013-01-22 NOTE — Progress Notes (Signed)
Subjective:    Patient ID: Trevor Mack, male    DOB: [birth year], 57 y.o.   MRN: [id number]  HPI 01/22/2013 Cough is the same.  Mucus is less. URI developed about one week ago.  Pt now down to 1/2 ppd smoking.  Using the nicotine replacement therapy.   Notes some wheezing . Mucus now is beige.   Pt still dyspneic  Past Medical History  Diagnosis Date  . ANXIETY 07/23/2008  . COLONIC POLYPS, HX OF 03/26/2006    MULTIPLE FRAGMENTS OF TUBULAR ADENOMAS POLYPS  . GERD 07/23/2008  . HYPERLIPIDEMIA 07/23/2008  . HYPERTENSION 07/23/2008  . TENDINITIS, LEFT THUMB 07/15/2010  . Family history of colon cancer     Father      Family History  Problem Relation Age of Onset  . Colon cancer Father   . Hypertension Father   . Diabetes Father   . Heart attack Father      History   Social History  . Marital Status: Married    Spouse Name: N/A    Number of Children: 0  . Years of Education: N/A   Occupational History  .  [employer]   Social History Main Topics  . Smoking status: Current Every Day Smoker    Types: Cigarettes  . Smokeless tobacco: Never Used     Comment: Started smoking at age 10.  Currently smoking 1 1/2 ppd.  . Alcohol Use: No  . Drug Use: No  . Sexually Active: Not on file   Other Topics Concern  . Not on file   Social History Narrative  . No narrative on file     No Known Allergies   Outpatient Prescriptions Prior to Visit  Medication Sig Dispense Refill  . citalopram (CELEXA) 40 MG tablet TAKE ONE TABLET BY MOUTH EVERY DAY  30 tablet  3  . esomeprazole (NEXIUM) 40 MG capsule Take 1 capsule (40 mg total) by mouth daily before breakfast.  30 capsule  11  . LORazepam (ATIVAN) 0.5 MG tablet TAKE ONE TABLET BY MOUTH TWICE DAILY AS NEEDED  60 tablet  2  . losartan (COZAAR) 50 MG tablet Take 1 tablet (50 mg total) by mouth daily.  30 tablet  6  . nicotine (NICOTROL) 10 MG inhaler Inhale 1 puff into the lungs as needed for smoking cessation.  168 each   2  . pravastatin (PRAVACHOL) 40 MG tablet TAKE ONE TABLET BY MOUTH EVERY DAY  90 tablet  1  . predniSONE (DELTASONE) 10 MG tablet Take 1 tablet (10 mg total) by mouth daily.  20 tablet  0  . tiotropium (SPIRIVA HANDIHALER) 18 MCG inhalation capsule Place 1 capsule (18 mcg total) into inhaler and inhale daily.  30 capsule  2  Last reviewed on 01/22/2013  3:08 PM by Storm Frisk, MD    Review of Systems  Constitutional: Negative for unexpected weight change.  HENT: Positive for congestion, sneezing and dental problem. Negative for nosebleeds, trouble swallowing and sinus pressure.   Eyes: Negative for redness and itching.  Respiratory: Positive for choking (when takes a coughing spell). Negative for chest tightness.   Cardiovascular: Negative for palpitations.  Gastrointestinal: Negative for nausea.  Genitourinary: Negative for dysuria.  Musculoskeletal: Negative for joint swelling.  Hematological: Does not bruise/bleed easily.  Psychiatric/Behavioral: Positive for dysphoric mood. The patient is nervous/anxious.        Objective:   Physical Exam  Filed Vitals:   01/22/13 1459  BP: 138/70  Pulse:  89  Temp: 97.8 F (36.6 C)  TempSrc: Oral  Height: 5\' 9"  (1.753 m)  Weight: 210 lb 6.4 oz (95.437 kg)  SpO2: 96%    Gen: Pleasant, well-nourished, in no distress,  normal affect  ENT: No lesions,  mouth clear,  oropharynx clear, no postnasal drip  Neck: No JVD, no TMG, no carotid bruits  Lungs: No use of accessory muscles, no dullness to percussion, distant BS, exp wheezes  Cardiovascular: RRR, heart sounds normal, no murmur or gallops, no peripheral edema  Abdomen: soft and NT, no HSM,  BS normal  Musculoskeletal: No deformities, no cyanosis or clubbing  Neuro: alert, non focal  Skin: Warm, no lesions or rashes        Assessment & Plan:   Chronic obstructive lung disease with acute on chronic bronchitis and exacerbation gold stage C Acute tracheobronchitis with  associated golds stage C. COPD Ongoing tobacco use Plan Smoking cessation counseling given to the patient The patient was  counseled as to proper use Nicotrol Inhaler Ceftin 500 mg twice daily for 7 days Depo-Medrol injection 100 mg IM given Maintain Spiriva    Updated Medication List Outpatient Encounter Prescriptions as of 01/22/2013  Medication Sig Dispense Refill  . citalopram (CELEXA) 40 MG tablet TAKE ONE TABLET BY MOUTH EVERY DAY  30 tablet  3  . esomeprazole (NEXIUM) 40 MG capsule Take 1 capsule (40 mg total) by mouth daily before breakfast.  30 capsule  11  . LORazepam (ATIVAN) 0.5 MG tablet TAKE ONE TABLET BY MOUTH TWICE DAILY AS NEEDED  60 tablet  2  . losartan (COZAAR) 50 MG tablet Take 1 tablet (50 mg total) by mouth daily.  30 tablet  6  . nicotine (NICOTROL) 10 MG inhaler Inhale 1 puff into the lungs as needed for smoking cessation.  168 each  2  . Phenylephrine-DM-GG-APAP (VICKS DAYQUIL SEVERE COLD/FLU) 5-10-200-325 MG TABS Take 1 tablet by mouth 2 (two) times daily.      . pravastatin (PRAVACHOL) 40 MG tablet TAKE ONE TABLET BY MOUTH EVERY DAY  90 tablet  1  . predniSONE (DELTASONE) 10 MG tablet Take 1 tablet (10 mg total) by mouth daily.  20 tablet  0  . tiotropium (SPIRIVA HANDIHALER) 18 MCG inhalation capsule Place 1 capsule (18 mcg total) into inhaler and inhale daily.  30 capsule  2  . cefUROXime (CEFTIN) 500 MG tablet Take 1 tablet (500 mg total) by mouth 2 (two) times daily.  14 tablet  0  . [EXPIRED] methylPREDNISolone acetate (DEPO-MEDROL) injection 120 mg

## 2013-01-22 NOTE — Assessment & Plan Note (Signed)
Acute tracheobronchitis with associated golds stage C. COPD Ongoing tobacco use Plan Smoking cessation counseling given to the patient The patient was  counseled as to proper use Nicotrol Inhaler Ceftin 500 mg twice daily for 7 days Depo-Medrol injection 100 mg IM given Maintain Spiriva

## 2013-03-24 ENCOUNTER — Ambulatory Visit: Payer: Self-pay | Admitting: Critical Care Medicine

## 2013-04-01 ENCOUNTER — Other Ambulatory Visit: Payer: Self-pay | Admitting: Internal Medicine

## 2013-04-10 ENCOUNTER — Other Ambulatory Visit: Payer: Self-pay | Admitting: Internal Medicine

## 2013-04-22 ENCOUNTER — Other Ambulatory Visit: Payer: Self-pay | Admitting: Internal Medicine

## 2013-05-12 ENCOUNTER — Telehealth: Payer: Self-pay | Admitting: *Deleted

## 2013-05-12 NOTE — Telephone Encounter (Signed)
Received via fax a prior auth for Nexium. Filled out and faxed back. Sent downstairs to be scanned in.

## 2013-05-27 ENCOUNTER — Other Ambulatory Visit: Payer: Self-pay | Admitting: Internal Medicine

## 2013-06-12 ENCOUNTER — Ambulatory Visit: Payer: Self-pay | Admitting: Internal Medicine

## 2013-06-13 ENCOUNTER — Encounter: Payer: Self-pay | Admitting: Internal Medicine

## 2013-06-13 ENCOUNTER — Ambulatory Visit (INDEPENDENT_AMBULATORY_CARE_PROVIDER_SITE_OTHER): Payer: BC Managed Care – PPO | Admitting: Internal Medicine

## 2013-06-13 VITALS — BP 126/80 | HR 98 | Temp 98.6°F | Resp 20 | Wt 208.0 lb

## 2013-06-13 DIAGNOSIS — Z720 Tobacco use: Secondary | ICD-10-CM

## 2013-06-13 DIAGNOSIS — E785 Hyperlipidemia, unspecified: Secondary | ICD-10-CM

## 2013-06-13 DIAGNOSIS — F411 Generalized anxiety disorder: Secondary | ICD-10-CM

## 2013-06-13 DIAGNOSIS — M722 Plantar fascial fibromatosis: Secondary | ICD-10-CM

## 2013-06-13 DIAGNOSIS — I10 Essential (primary) hypertension: Secondary | ICD-10-CM

## 2013-06-13 DIAGNOSIS — J441 Chronic obstructive pulmonary disease with (acute) exacerbation: Secondary | ICD-10-CM

## 2013-06-13 DIAGNOSIS — F17200 Nicotine dependence, unspecified, uncomplicated: Secondary | ICD-10-CM

## 2013-06-13 MED ORDER — LOSARTAN POTASSIUM 50 MG PO TABS: 50.0000 mg | ORAL_TABLET | Freq: Every day | ORAL | Status: DC

## 2013-06-13 MED ORDER — PRAVASTATIN SODIUM 40 MG PO TABS: ORAL_TABLET | ORAL | Status: DC

## 2013-06-13 MED ORDER — CITALOPRAM HYDROBROMIDE 40 MG PO TABS: ORAL_TABLET | ORAL | Status: DC

## 2013-06-13 MED ORDER — LISINOPRIL 10 MG PO TABS: ORAL_TABLET | ORAL | Status: DC

## 2013-06-13 MED ORDER — OMEPRAZOLE 40 MG PO CPDR: 40.0000 mg | DELAYED_RELEASE_CAPSULE | Freq: Every day | ORAL | Status: DC

## 2013-06-13 MED ORDER — LORAZEPAM 0.5 MG PO TABS: ORAL_TABLET | ORAL | Status: DC

## 2013-06-13 NOTE — Patient Instructions (Addendum)
Limit your sodium (Salt) intake  Discontinue lisinopril    It is important that you exercise regularly, at least 20 minutes 3 to 4 times per week.  If you develop chest pain or shortness of breath seek  medical attention.  Return in 6 months for follow-up Plantar Fasciitis Plantar fasciitis is a common condition that causes foot pain. It is soreness (inflammation) of the band of tough fibrous tissue on the bottom of the foot that runs from the heel bone (calcaneus) to the ball of the foot. The cause of this soreness may be from excessive standing, poor fitting shoes, running on hard surfaces, being overweight, having an abnormal walk, or overuse (this is common in runners) of the painful foot or feet. It is also common in aerobic exercise dancers and ballet dancers. SYMPTOMS  Most people with plantar fasciitis complain of:  Severe pain in the morning on the bottom of their foot especially when taking the first steps out of bed. This pain recedes after a few minutes of walking.  Severe pain is experienced also during walking following a long period of inactivity.  Pain is worse when walking barefoot or up stairs DIAGNOSIS   Your caregiver will diagnose this condition by examining and feeling your foot.  Special tests such as X-rays of your foot, are usually not needed. PREVENTION   Consult a sports medicine professional before beginning a new exercise program.  Walking programs offer a good workout. With walking there is a lower chance of overuse injuries common to runners. There is less impact and less jarring of the joints.  Begin all new exercise programs slowly. If problems or pain develop, decrease the amount of time or distance until you are at a comfortable level.  Wear good shoes and replace them regularly.  Stretch your foot and the heel cords at the back of the ankle (Achilles tendon) both before and after exercise.  Run or exercise on even surfaces that are not hard. For  example, asphalt is better than pavement.  Do not run barefoot on hard surfaces.  If using a treadmill, vary the incline.  Do not continue to workout if you have foot or joint problems. Seek professional help if they do not improve. HOME CARE INSTRUCTIONS   Avoid activities that cause you pain until you recover.  Use ice or cold packs on the problem or painful areas after working out.  Only take over-the-counter or prescription medicines for pain, discomfort, or fever as directed by your caregiver.  Soft shoe inserts or athletic shoes with air or gel sole cushions may be helpful.  If problems continue or become more severe, consult a sports medicine caregiver or your own health care provider. Cortisone is a potent anti-inflammatory medication that may be injected into the painful area. You can discuss this treatment with your caregiver. MAKE SURE YOU:   Understand these instructions.  Will watch your condition.  Will get help right away if you are not doing well or get worse. Document Released: 09/05/2001 Document Revised: 03/04/2012 Document Reviewed: 11/04/2008 [facility] Patient Information 2014 [place], [place]. Plantar Fasciitis (Heel Spur Syndrome) with Rehab The plantar fascia is a fibrous, ligament-like, soft-tissue structure that spans the bottom of the foot. Plantar fasciitis is a condition that causes pain in the foot due to inflammation of the tissue. SYMPTOMS   Pain and tenderness on the underneath side of the foot.  Pain that worsens with standing or walking. CAUSES  Plantar fasciitis is caused by irritation and  injury to the plantar fascia on the underneath side of the foot. Common mechanisms of injury include:  Direct trauma to bottom of the foot.  Damage to a small nerve that runs under the foot where the main fascia attaches to the heel bone.  Stress placed on the plantar fascia due to bone spurs. RISK INCREASES WITH:   Activities that place stress on the  plantar fascia (running, jumping, pivoting, or cutting).  Poor strength and flexibility.  Improperly fitted shoes.  Tight calf muscles.  Flat feet.  Failure to warm-up properly before activity.  Obesity. PREVENTION  Warm up and stretch properly before activity.  Allow for adequate recovery between workouts.  Maintain physical fitness:  Strength, flexibility, and endurance.  Cardiovascular fitness.  Maintain a health body weight.  Avoid stress on the plantar fascia.  Wear properly fitted shoes, including arch supports for individuals who have flat feet. PROGNOSIS  If treated properly, then the symptoms of plantar fasciitis usually resolve without surgery. However, occasionally surgery is necessary. RELATED COMPLICATIONS   Recurrent symptoms that may result in a chronic condition.  Problems of the lower back that are caused by compensating for the injury, such as limping.  Pain or weakness of the foot during push-off following surgery.  Chronic inflammation, scarring, and partial or complete fascia tear, occurring more often from repeated injections. TREATMENT  Treatment initially involves the use of ice and medication to help reduce pain and inflammation. The use of strengthening and stretching exercises may help reduce pain with activity, especially stretches of the Achilles tendon. These exercises may be performed at home or with a therapist. Your caregiver may recommend that you use heel cups of arch supports to help reduce stress on the plantar fascia. Occasionally, corticosteroid injections are given to reduce inflammation. If symptoms persist for greater than 6 months despite non-surgical (conservative), then surgery may be recommended.  MEDICATION   If pain medication is necessary, then nonsteroidal anti-inflammatory medications, such as aspirin and ibuprofen, or other minor pain relievers, such as acetaminophen, are often recommended.  Do not take pain medication  within 7 days before surgery.  Prescription pain relievers may be given if deemed necessary by your caregiver. Use only as directed and only as much as you need.  Corticosteroid injections may be given by your caregiver. These injections should be reserved for the most serious cases, because they may only be given a certain number of times. HEAT AND COLD  Cold treatment (icing) relieves pain and reduces inflammation. Cold treatment should be applied for 10 to 15 minutes every 2 to 3 hours for inflammation and pain and immediately after any activity that aggravates your symptoms. Use ice packs or massage the area with a piece of ice (ice massage).  Heat treatment may be used prior to performing the stretching and strengthening activities prescribed by your caregiver, physical therapist, or athletic trainer. Use a heat pack or soak the injury in warm water. SEEK IMMEDIATE MEDICAL CARE IF:  Treatment seems to offer no benefit, or the condition worsens.  Any medications produce adverse side effects. EXERCISES RANGE OF MOTION (ROM) AND STRETCHING EXERCISES - Plantar Fasciitis (Heel Spur Syndrome) These exercises may help you when beginning to rehabilitate your injury. Your symptoms may resolve with or without further involvement from your physician, physical therapist or athletic trainer. While completing these exercises, remember:   Restoring tissue flexibility helps normal motion to return to the joints. This allows healthier, less painful movement and activity.  An effective  stretch should be held for at least 30 seconds.  A stretch should never be painful. You should only feel a gentle lengthening or release in the stretched tissue. RANGE OF MOTION - Toe Extension, Flexion  Sit with your right / left leg crossed over your opposite knee.  Grasp your toes and gently pull them back toward the top of your foot. You should feel a stretch on the bottom of your toes and/or foot.  Hold this  stretch for __________ seconds.  Now, gently pull your toes toward the bottom of your foot. You should feel a stretch on the top of your toes and or foot.  Hold this stretch for __________ seconds. Repeat __________ times. Complete this stretch __________ times per day.  RANGE OF MOTION - Ankle Dorsiflexion, Active Assisted  Remove shoes and sit on a chair that is preferably not on a carpeted surface.  Place right / left foot under knee. Extend your opposite leg for support.  Keeping your heel down, slide your right / left foot back toward the chair until you feel a stretch at your ankle or calf. If you do not feel a stretch, slide your bottom forward to the edge of the chair, while still keeping your heel down.  Hold this stretch for __________ seconds. Repeat __________ times. Complete this stretch __________ times per day.  STRETCH  Gastroc, Standing  Place hands on wall.  Extend right / left leg, keeping the front knee somewhat bent.  Slightly point your toes inward on your back foot.  Keeping your right / left heel on the floor and your knee straight, shift your weight toward the wall, not allowing your back to arch.  You should feel a gentle stretch in the right / left calf. Hold this position for __________ seconds. Repeat __________ times. Complete this stretch __________ times per day. STRETCH  Soleus, Standing  Place hands on wall.  Extend right / left leg, keeping the other knee somewhat bent.  Slightly point your toes inward on your back foot.  Keep your right / left heel on the floor, bend your back knee, and slightly shift your weight over the back leg so that you feel a gentle stretch deep in your back calf.  Hold this position for __________ seconds. Repeat __________ times. Complete this stretch __________ times per day. STRETCH  Gastrocsoleus, Standing  Note: This exercise can place a lot of stress on your foot and ankle. Please complete this exercise only  if specifically instructed by your caregiver.   Place the ball of your right / left foot on a step, keeping your other foot firmly on the same step.  Hold on to the wall or a rail for balance.  Slowly lift your other foot, allowing your body weight to press your heel down over the edge of the step.  You should feel a stretch in your right / left calf.  Hold this position for __________ seconds.  Repeat this exercise with a slight bend in your right / left knee. Repeat __________ times. Complete this stretch __________ times per day.  STRENGTHENING EXERCISES - Plantar Fasciitis (Heel Spur Syndrome)  These exercises may help you when beginning to rehabilitate your injury. They may resolve your symptoms with or without further involvement from your physician, physical therapist or athletic trainer. While completing these exercises, remember:   Muscles can gain both the endurance and the strength needed for everyday activities through controlled exercises.  Complete these exercises as instructed by  your physician, physical therapist or athletic trainer. Progress the resistance and repetitions only as guided. STRENGTH - Towel Curls  Sit in a chair positioned on a non-carpeted surface.  Place your foot on a towel, keeping your heel on the floor.  Pull the towel toward your heel by only curling your toes. Keep your heel on the floor.  If instructed by your physician, physical therapist or athletic trainer, add ____________________ at the end of the towel. Repeat __________ times. Complete this exercise __________ times per day. STRENGTH - Ankle Inversion  Secure one end of a rubber exercise band/tubing to a fixed object (table, pole). Loop the other end around your foot just before your toes.  Place your fists between your knees. This will focus your strengthening at your ankle.  Slowly, pull your big toe up and in, making sure the band/tubing is positioned to resist the entire  motion.  Hold this position for __________ seconds.  Have your muscles resist the band/tubing as it slowly pulls your foot back to the starting position. Repeat __________ times. Complete this exercises __________ times per day.  Document Released: 12/11/2005 Document Revised: 03/04/2012 Document Reviewed: 03/25/2009 [facility] Patient Information 2014 [place], [place].

## 2013-06-13 NOTE — Progress Notes (Signed)
Subjective:    Patient ID: Trevor Mack, male    DOB: [birth year], 57 y.o.   MRN: [id number]  HPI  57 year old patient who is seen today for followup medical problems include hypertension and dyslipidemia. Since his last visit to this clinic he has been evaluated by pulmonary medicine as well as gastroenterology. His chief complaint today is left medial heel pain that's been present for about one year. Pain seems to worsen about one or 2:00 in the afternoon. His left-sided carpal tunnel symptoms have resolved He was evaluated by pulmonary for COPD. His smoking and is now up to a pack and a half daily  Since his last visit here he has lost his wife due to complications of advanced dementia  Past Medical History  Diagnosis Date  . ANXIETY 07/23/2008  . COLONIC POLYPS, HX OF 03/26/2006    MULTIPLE FRAGMENTS OF TUBULAR ADENOMAS POLYPS  . GERD 07/23/2008  . HYPERLIPIDEMIA 07/23/2008  . HYPERTENSION 07/23/2008  . TENDINITIS, LEFT THUMB 07/15/2010  . Family history of colon cancer     Father     History   Social History  . Marital Status: Married    Spouse Name: N/A    Number of Children: 0  . Years of Education: N/A   Occupational History  .  [employer]   Social History Main Topics  . Smoking status: Current Every Day Smoker    Types: Cigarettes  . Smokeless tobacco: Never Used     Comment: Started smoking at age 57.  Currently smoking 1 1/2 ppd.  . Alcohol Use: No  . Drug Use: No  . Sexually Active: Not on file   Other Topics Concern  . Not on file   Social History Narrative  . No narrative on file    Past Surgical History  Procedure Laterality Date  . Lumbar laminectomy      Family History  Problem Relation Age of Onset  . Colon cancer Father   . Hypertension Father   . Diabetes Father   . Heart attack Father     No Known Allergies  Current Outpatient Prescriptions on File Prior to Visit  Medication Sig Dispense Refill  . citalopram (CELEXA) 40 MG  tablet TAKE ONE TABLET BY MOUTH EVERY DAY  30 tablet  0  . lisinopril (PRINIVIL,ZESTRIL) 10 MG tablet TAKE ONE TABLET BY MOUTH EVERY DAY  90 tablet  0  . LORazepam (ATIVAN) 0.5 MG tablet TAKE ONE TABLET BY MOUTH TWICE DAILY AS NEEDED  60 tablet  1  . losartan (COZAAR) 50 MG tablet Take 1 tablet (50 mg total) by mouth daily.  30 tablet  6  . Phenylephrine-DM-GG-APAP (VICKS DAYQUIL SEVERE COLD/FLU) 5-10-200-325 MG TABS Take 1 tablet by mouth 2 (two) times daily.      . pravastatin (PRAVACHOL) 40 MG tablet TAKE ONE TABLET BY MOUTH EVERY DAY  90 tablet  1  . tiotropium (SPIRIVA HANDIHALER) 18 MCG inhalation capsule Place 1 capsule (18 mcg total) into inhaler and inhale daily.  30 capsule  2  . esomeprazole (NEXIUM) 40 MG capsule Take 1 capsule (40 mg total) by mouth daily before breakfast.  30 capsule  11   No current facility-administered medications on file prior to visit.    BP 126/80  Pulse 98  Temp(Src) 98.6 F (37 C) (Oral)  Resp 20  Wt 208 lb (94.348 kg)  BMI 30.7 kg/m2  SpO2 96%       Review of Systems  Constitutional: Negative  for fever, chills, appetite change and fatigue.  HENT: Negative for hearing loss, ear pain, congestion, sore throat, trouble swallowing, neck stiffness, dental problem, voice change and tinnitus.   Eyes: Negative for pain, discharge and visual disturbance.  Respiratory: Positive for shortness of breath. Negative for cough, chest tightness, wheezing and stridor.   Cardiovascular: Negative for chest pain, palpitations and leg swelling.  Gastrointestinal: Negative for nausea, vomiting, abdominal pain, diarrhea, constipation, blood in stool and abdominal distention.  Genitourinary: Negative for urgency, hematuria, flank pain, discharge, difficulty urinating and genital sores.  Musculoskeletal: Positive for gait problem. Negative for myalgias, back pain, joint swelling and arthralgias.       Left heel pain  Skin: Negative for rash.  Neurological: Negative  for dizziness, syncope, speech difficulty, weakness, numbness and headaches.  Hematological: Negative for adenopathy. Does not bruise/bleed easily.  Psychiatric/Behavioral: Negative for behavioral problems and dysphoric mood. The patient is not nervous/anxious.        Objective:   Physical Exam  Constitutional: He is oriented to person, place, and time. He appears well-developed.  HENT:  Head: Normocephalic.  Right Ear: External ear normal.  Left Ear: External ear normal.  Eyes: Conjunctivae and EOM are normal.  Neck: Normal range of motion.  Cardiovascular: Normal rate and normal heart sounds.   Pulmonary/Chest: Breath sounds normal.  O2 saturation 96%  Abdominal: Bowel sounds are normal.  Musculoskeletal: Normal range of motion. He exhibits no edema and no tenderness.  Neurological: He is alert and oriented to person, place, and time.  Psychiatric: He has a normal mood and affect. His behavior is normal.          Assessment & Plan:   Hypertension  well controlled will continue present regimen Dyslipidemia COPD Left heel pain. Probable plantar fasciitis  CPX 6 months

## 2013-06-17 ENCOUNTER — Telehealth: Payer: Self-pay | Admitting: Internal Medicine

## 2013-06-17 NOTE — Telephone Encounter (Signed)
Suggest he obtain some heel pads due  to his plantar fasciitis. If he needs new athletic shoes suggest "new balance"

## 2013-06-17 NOTE — Telephone Encounter (Signed)
Pt called and stated that when he was recently seen, Dr. Kirtland Bouchard instructed him to purchase new shoes. The new shoes are supposed to be helpful for his injured foot. He stated that he does not remember what kind of shoes, and would like more details. Please assist.

## 2013-06-18 NOTE — Telephone Encounter (Signed)
Spoke to pt told him to obtain some heel pads and if he needs new shoes suggest " New Balance" per Dr. Amador Cunas. Pt verbalized understanding and stated he got heel pads and that it is for his boots. Told pt I would ask the boot place what they would suggest and you can take Ibuprofen for the pain in his heels. Pt verbalized understanding.

## 2013-06-30 ENCOUNTER — Encounter: Payer: Self-pay | Admitting: Internal Medicine

## 2013-06-30 ENCOUNTER — Ambulatory Visit (INDEPENDENT_AMBULATORY_CARE_PROVIDER_SITE_OTHER): Payer: BC Managed Care – PPO | Admitting: Internal Medicine

## 2013-06-30 VITALS — BP 140/80 | HR 112 | Temp 98.8°F | Resp 20 | Wt 206.0 lb

## 2013-06-30 DIAGNOSIS — F17200 Nicotine dependence, unspecified, uncomplicated: Secondary | ICD-10-CM

## 2013-06-30 DIAGNOSIS — I10 Essential (primary) hypertension: Secondary | ICD-10-CM

## 2013-06-30 DIAGNOSIS — J441 Chronic obstructive pulmonary disease with (acute) exacerbation: Secondary | ICD-10-CM

## 2013-06-30 DIAGNOSIS — Z720 Tobacco use: Secondary | ICD-10-CM

## 2013-06-30 DIAGNOSIS — F411 Generalized anxiety disorder: Secondary | ICD-10-CM

## 2013-06-30 MED ORDER — ALBUTEROL SULFATE HFA 108 (90 BASE) MCG/ACT IN AERS: 2.0000 | INHALATION_SPRAY | Freq: Four times a day (QID) | RESPIRATORY_TRACT | Status: DC | PRN

## 2013-06-30 NOTE — Progress Notes (Signed)
Subjective:    Patient ID: Trevor Mack, male    DOB: [birth year], 57 y.o.   MRN: [id number]  HPI   57 year old patient who has a history of hypertension anxiety and also COPD. He uses Spiriva sporadically. He has been out of work since last week do to an acute diarrheal illness that has resolved. Yesterday after 2 hours of outdoor yard work he had an episode of dyspnea associated with coughing. He slept well last night and today feels back to baseline.  He is requesting a return to work note since he has missed several days of work.  Past Medical History  Diagnosis Date  . ANXIETY 07/23/2008  . COLONIC POLYPS, HX OF 03/26/2006    MULTIPLE FRAGMENTS OF TUBULAR ADENOMAS POLYPS  . GERD 07/23/2008  . HYPERLIPIDEMIA 07/23/2008  . HYPERTENSION 07/23/2008  . TENDINITIS, LEFT THUMB 07/15/2010  . Family history of colon cancer     Father     History   Social History  . Marital Status: Married    Spouse Name: N/A    Number of Children: 0  . Years of Education: N/A   Occupational History  .  [employer]   Social History Main Topics  . Smoking status: Current Every Day Smoker    Types: Cigarettes  . Smokeless tobacco: Never Used     Comment: Started smoking at age 7.  Currently smoking 1 1/2 ppd.  . Alcohol Use: No  . Drug Use: No  . Sexually Active: Not on file   Other Topics Concern  . Not on file   Social History Narrative  . No narrative on file    Past Surgical History  Procedure Laterality Date  . Lumbar laminectomy      Family History  Problem Relation Age of Onset  . Colon cancer Father   . Hypertension Father   . Diabetes Father   . Heart attack Father     No Known Allergies  Current Outpatient Prescriptions on File Prior to Visit  Medication Sig Dispense Refill  . citalopram (CELEXA) 40 MG tablet TAKE ONE TABLET BY MOUTH EVERY DAY  90 tablet  3  . LORazepam (ATIVAN) 0.5 MG tablet TAKE ONE TABLET BY MOUTH TWICE DAILY AS NEEDED  60 tablet  1  .  omeprazole (PRILOSEC) 40 MG capsule Take 1 capsule (40 mg total) by mouth daily.  30 capsule  3  . pravastatin (PRAVACHOL) 40 MG tablet TAKE ONE TABLET BY MOUTH EVERY DAY  90 tablet  1  . tiotropium (SPIRIVA HANDIHALER) 18 MCG inhalation capsule Place 1 capsule (18 mcg total) into inhaler and inhale daily.  30 capsule  2   No current facility-administered medications on file prior to visit.    BP 140/80  Pulse 112  Temp(Src) 98.8 F (37.1 C) (Oral)  Resp 20  Wt 206 lb (93.441 kg)  BMI 30.41 kg/m2  SpO2 97%       Review of Systems  Constitutional: Negative for fever, chills, appetite change and fatigue.  HENT: Negative for hearing loss, ear pain, congestion, sore throat, trouble swallowing, neck stiffness, dental problem, voice change and tinnitus.   Eyes: Negative for pain, discharge and visual disturbance.  Respiratory: Positive for cough and shortness of breath. Negative for chest tightness, wheezing and stridor.   Cardiovascular: Negative for chest pain, palpitations and leg swelling.  Gastrointestinal: Positive for diarrhea. Negative for nausea, vomiting, abdominal pain, constipation, blood in stool and abdominal distention.  Genitourinary: Negative for urgency,  hematuria, flank pain, discharge, difficulty urinating and genital sores.  Musculoskeletal: Negative for myalgias, back pain, joint swelling, arthralgias and gait problem.  Skin: Negative for rash.  Neurological: Negative for dizziness, syncope, speech difficulty, weakness, numbness and headaches.  Hematological: Negative for adenopathy. Does not bruise/bleed easily.  Psychiatric/Behavioral: Negative for behavioral problems and dysphoric mood. The patient is not nervous/anxious.        Objective:   Physical Exam  Constitutional: He is oriented to person, place, and time. He appears well-developed.  HENT:  Head: Normocephalic.  Right Ear: External ear normal.  Left Ear: External ear normal.  Eyes: Conjunctivae  and EOM are normal.  Neck: Normal range of motion.  Cardiovascular: Normal rate and normal heart sounds.   Pulmonary/Chest: Effort normal and breath sounds normal. No respiratory distress. He has no wheezes. He has no rales. He exhibits no tenderness.  O2 saturation 97  Abdominal: Bowel sounds are normal.  Musculoskeletal: Normal range of motion. He exhibits no edema and no tenderness.  Neurological: He is alert and oriented to person, place, and time.  Psychiatric: He has a normal mood and affect. His behavior is normal.          Assessment & Plan:   COPD. Mild exacerbation yesterday.  He exposure probably a factor. Smoking cessation encouraged. We'll give a prescription for albuterol rescue. Continue Spiriva which the patient has not used consistently Hypertension stable Acute diarrheal illness resolved

## 2013-06-30 NOTE — Patient Instructions (Signed)
Smoking tobacco is very bad for your health. You should stop smoking immediately.  Call or return to clinic prn if these symptoms worsen or fail to improve as anticipated.

## 2013-07-23 ENCOUNTER — Telehealth: Payer: Self-pay | Admitting: Internal Medicine

## 2013-07-23 NOTE — Telephone Encounter (Signed)
Pt is calling to request a work note for the following dates; 7/7, 7/10, 7/11, 7/16, 7/22, and 7/29. He states that he has had a lot of trouble with breathing and his nerves, that you are aware of. If this is completed, fax it to [employer] [phone]). Please assist.

## 2013-07-23 NOTE — Telephone Encounter (Signed)
ok 

## 2013-07-23 NOTE — Telephone Encounter (Signed)
Spoke to pt told him Dr.Kwiatkowksi said he will give note. Told him will fax note today. Pt verbalized understanding. Clarified info with pt, name is Trevor Mack. Note done and faxed.

## 2013-08-18 ENCOUNTER — Other Ambulatory Visit: Payer: Self-pay | Admitting: Internal Medicine

## 2013-12-26 ENCOUNTER — Other Ambulatory Visit: Payer: Self-pay | Admitting: Internal Medicine

## 2014-03-16 ENCOUNTER — Other Ambulatory Visit: Payer: Self-pay | Admitting: Internal Medicine

## 2014-03-31 ENCOUNTER — Other Ambulatory Visit: Payer: Self-pay | Admitting: Internal Medicine

## 2014-04-01 ENCOUNTER — Other Ambulatory Visit: Payer: Self-pay | Admitting: Internal Medicine

## 2014-04-04 ENCOUNTER — Other Ambulatory Visit: Payer: Self-pay | Admitting: Internal Medicine

## 2014-04-13 ENCOUNTER — Ambulatory Visit (INDEPENDENT_AMBULATORY_CARE_PROVIDER_SITE_OTHER): Payer: BC Managed Care – PPO | Admitting: Internal Medicine

## 2014-04-13 ENCOUNTER — Encounter: Payer: Self-pay | Admitting: Internal Medicine

## 2014-04-13 VITALS — BP 152/90 | HR 103 | Temp 98.9°F | Resp 20 | Ht 69.0 in | Wt 207.0 lb

## 2014-04-13 DIAGNOSIS — I10 Essential (primary) hypertension: Secondary | ICD-10-CM

## 2014-04-13 DIAGNOSIS — F17200 Nicotine dependence, unspecified, uncomplicated: Secondary | ICD-10-CM

## 2014-04-13 DIAGNOSIS — Z720 Tobacco use: Secondary | ICD-10-CM

## 2014-04-13 DIAGNOSIS — J441 Chronic obstructive pulmonary disease with (acute) exacerbation: Secondary | ICD-10-CM

## 2014-04-13 MED ORDER — LOSARTAN POTASSIUM 100 MG PO TABS: 100.0000 mg | ORAL_TABLET | Freq: Every day | ORAL | Status: DC

## 2014-04-13 NOTE — Progress Notes (Signed)
Pre-visit discussion using our clinic review tool. No additional management support is needed unless otherwise documented below in the visit note.  

## 2014-04-13 NOTE — Progress Notes (Signed)
Subjective:    Patient ID: Trevor Mack, male    DOB: 1956-04-16, 58 y.o.   MRN: [id number]  HPI  58 year old patient who has a history of COPD, allergic rhinitis, and treated hypertension. For the past 2 weeks.  He has had worsening cough with rhinorrhea and postnasal drip.  2 weeks ago.  He had a brief ankle episode associated with severe paroxysms of coughing.  Social history- recent loss of his Mother, age 57  Past Medical History  Diagnosis Date  . ANXIETY 07/23/2008  . COLONIC POLYPS, HX OF 03/26/2006    MULTIPLE FRAGMENTS OF TUBULAR ADENOMAS POLYPS  . GERD 07/23/2008  . HYPERLIPIDEMIA 07/23/2008  . HYPERTENSION 07/23/2008  . TENDINITIS, LEFT THUMB 07/15/2010  . Family history of colon cancer     Father     History   Social History  . Marital Status: Married    Spouse Name: N/A    Number of Children: 0  . Years of Education: N/A   Occupational History  .  [place] History Main Topics  . Smoking status: Current Every Day Smoker    Types: Cigarettes  . Smokeless tobacco: Never Used     Comment: Started smoking at age 14.  Currently smoking 1 1/2 ppd.  . Alcohol Use: No  . Drug Use: No  . Sexual Activity: Not on file   Other Topics Concern  . Not on file   Social History Narrative  . No narrative on file    Past Surgical History  Procedure Laterality Date  . Lumbar laminectomy      Family History  Problem Relation Age of Onset  . Colon cancer Father   . Hypertension Father   . Diabetes Father   . Heart attack Father     No Known Allergies  Current Outpatient Prescriptions on File Prior to Visit  Medication Sig Dispense Refill  . citalopram (CELEXA) 40 MG tablet TAKE ONE TABLET BY MOUTH EVERY DAY  90 tablet  3  . lisinopril (PRINIVIL,ZESTRIL) 10 MG tablet Take 10 mg by mouth daily.       Marland Kitchen LORazepam (ATIVAN) 0.5 MG tablet TAKE ONE TABLET BY MOUTH TWICE DAILY AS NEEDED  60 tablet  2  . omeprazole (PRILOSEC) 40 MG capsule Take  1 capsule (40 mg total) by mouth daily.  30 capsule  3  . pravastatin (PRAVACHOL) 40 MG tablet TAKE ONE TABLET BY MOUTH ONCE DAILY  90 tablet  1  . VENTOLIN HFA 108 (90 BASE) MCG/ACT inhaler INHALE TWO PUFFS INTO THE LUNGS BY MOUTH EVERY 6 HOURS AS NEEDED FOR  WHEEZING  18 g  3   No current facility-administered medications on file prior to visit.    BP 152/90  Pulse 103  Temp(Src) 98.9 F (37.2 C) (Oral)  Resp 20  Ht 5\' 9"  (1.753 m)  Wt 207 lb (93.895 kg)  BMI 30.55 kg/m2  SpO2 96%     Review of Systems  Constitutional: Negative for fever, chills, appetite change and fatigue.  HENT: Positive for congestion, postnasal drip, rhinorrhea and voice change. Negative for dental problem, ear pain, hearing loss, sore throat, tinnitus and trouble swallowing.   Eyes: Negative for pain, discharge and visual disturbance.  Respiratory: Positive for cough. Negative for chest tightness, wheezing and stridor.   Cardiovascular: Negative for chest pain, palpitations and leg swelling.  Gastrointestinal: Negative for nausea, vomiting, abdominal pain, diarrhea, constipation, blood in stool and abdominal distention.  Genitourinary: Negative  for urgency, hematuria, flank pain, discharge, difficulty urinating and genital sores.  Musculoskeletal: Negative for arthralgias, back pain, gait problem, joint swelling, myalgias and neck stiffness.  Skin: Negative for rash.  Neurological: Negative for dizziness, syncope, speech difficulty, weakness, numbness and headaches.  Hematological: Negative for adenopathy. Does not bruise/bleed easily.  Psychiatric/Behavioral: Negative for behavioral problems and dysphoric mood. The patient is not nervous/anxious.        Objective:   Physical Exam  Constitutional: He is oriented to person, place, and time. He appears well-developed.  HENT:  Head: Normocephalic.  Right Ear: External ear normal.  Left Ear: External ear normal.  Eyes: Conjunctivae and EOM are normal.    Neck: Normal range of motion.  Cardiovascular: Normal rate and normal heart sounds.   Pulmonary/Chest: He has no wheezes.  Diminished breath sounds, but clear  Abdominal: Bowel sounds are normal.  Musculoskeletal: Normal range of motion. He exhibits no edema and no tenderness.  Neurological: He is alert and oriented to person, place, and time.  Psychiatric: He has a normal mood and affect. His behavior is normal.          Assessment & Plan:   Hypertension Allergic rhinitis COPD Ongoing tobacco use  Total cessation of smoking.  Encouraged Schedule CPX Tree with samples  Of Qnasl Nonsedating antihistamine recommended Samples of Mucinex DM

## 2014-04-13 NOTE — Patient Instructions (Signed)
Limit your sodium (Salt) intake  Please check your blood pressure on a regular basis.  If it is consistently greater than 150/90, please make an office appointment.  Smoking tobacco is very bad for your health. You should stop smoking immediately.  Return in 3 months for follow-up  Use a nonsedating antihistamine such as Zyrtec or Allegra  Qnasl use daily

## 2014-04-14 ENCOUNTER — Telehealth: Payer: Self-pay | Admitting: Internal Medicine

## 2014-04-14 NOTE — Telephone Encounter (Signed)
Relevant patient education mailed to patient.  

## 2014-05-11 ENCOUNTER — Encounter: Payer: Self-pay | Admitting: Internal Medicine

## 2014-05-11 ENCOUNTER — Ambulatory Visit (INDEPENDENT_AMBULATORY_CARE_PROVIDER_SITE_OTHER): Payer: BC Managed Care – PPO | Admitting: Internal Medicine

## 2014-05-11 ENCOUNTER — Telehealth: Payer: Self-pay | Admitting: Internal Medicine

## 2014-05-11 VITALS — BP 150/90 | HR 96 | Temp 98.7°F | Resp 20 | Ht 69.0 in | Wt 206.0 lb

## 2014-05-11 DIAGNOSIS — J441 Chronic obstructive pulmonary disease with (acute) exacerbation: Secondary | ICD-10-CM

## 2014-05-11 DIAGNOSIS — I10 Essential (primary) hypertension: Secondary | ICD-10-CM

## 2014-05-11 MED ORDER — MECLIZINE HCL 25 MG PO TABS: 25.0000 mg | ORAL_TABLET | Freq: Three times a day (TID) | ORAL | Status: DC | PRN

## 2014-05-11 NOTE — Progress Notes (Signed)
Pre-visit discussion using our clinic review tool. No additional management support is needed unless otherwise documented below in the visit note.  

## 2014-05-11 NOTE — Patient Instructions (Signed)
Call or return to clinic prn if these symptoms worsen or fail to improve as anticipated.  Benign Positional Vertigo Vertigo means you feel like you or your surroundings are moving when they are not. Benign positional vertigo is the most common form of vertigo. Benign means that the cause of your condition is not serious. Benign positional vertigo is more common in older adults. CAUSES  Benign positional vertigo is the result of an upset in the labyrinth system. This is an area in the middle ear that helps control your balance. This may be caused by a viral infection, head injury, or repetitive motion. However, often no specific cause is found. SYMPTOMS  Symptoms of benign positional vertigo occur when you move your head or eyes in different directions. Some of the symptoms may include:  Loss of balance and falls.  Vomiting.  Blurred vision.  Dizziness.  Nausea.  Involuntary eye movements (nystagmus). DIAGNOSIS  Benign positional vertigo is usually diagnosed by physical exam. If the specific cause of your benign positional vertigo is unknown, your caregiver may perform imaging tests, such as magnetic resonance imaging (MRI) or computed tomography (CT). TREATMENT  Your caregiver may recommend movements or procedures to correct the benign positional vertigo. Medicines such as meclizine, benzodiazepines, and medicines for nausea may be used to treat your symptoms. In rare cases, if your symptoms are caused by certain conditions that affect the inner ear, you may need surgery. HOME CARE INSTRUCTIONS   Follow your caregiver's instructions.  Move slowly. Do not make sudden body or head movements.  Avoid driving.  Avoid operating heavy machinery.  Avoid performing any tasks that would be dangerous to you or others during a vertigo episode.  Drink enough fluids to keep your urine clear or pale yellow. SEEK IMMEDIATE MEDICAL CARE IF:   You develop problems with walking, weakness,  numbness, or using your arms, hands, or legs.  You have difficulty speaking.  You develop severe headaches.  Your nausea or vomiting continues or gets worse.  You develop visual changes.  Your family or friends notice any behavioral changes.  Your condition gets worse.  You have a fever.  You develop a stiff neck or sensitivity to light. MAKE SURE YOU:   Understand these instructions.  Will watch your condition.  Will get help right away if you are not doing well or get worse. Document Released: 09/18/2006 Document Revised: 03/04/2012 Document Reviewed: 08/31/2011 [facility] Patient Information 2014 [place].

## 2014-05-11 NOTE — Telephone Encounter (Signed)
Relevant patient education mailed to patient.  

## 2014-05-11 NOTE — Progress Notes (Signed)
Subjective:    Patient ID: Trevor Mack, male    DOB: [birth year], 57 y.o.   MRN: [id number]  HPI  58 year old patient who presents with a one to two-week history of dizziness.  He describes a spinning sensation with sudden movements such as laying flat in bed or head turning.  Symptoms last a few seconds and then resolve.  He also describes a sensation of becoming flushed, but no real nausea or vomiting.  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 ANXIETY 07/23/2008  . COLONIC POLYPS, HX OF 03/26/2006    MULTIPLE FRAGMENTS OF TUBULAR ADENOMAS POLYPS  . GERD 07/23/2008  . HYPERLIPIDEMIA 07/23/2008  . HYPERTENSION 07/23/2008  . TENDINITIS, LEFT THUMB 07/15/2010  . Family history of colon cancer     Father     History   Social History  . Marital Status: Married    Spouse Name: N/A    Number of Children: 0  . Years of Education: N/A   Occupational History  .  [place] History Main Topics  . Smoking status: Current Every Day Smoker    Types: Cigarettes  . Smokeless tobacco: Never Used     Comment: Started smoking at age 23.  Currently smoking 1 1/2 ppd.  . Alcohol Use: No  . Drug Use: No  . Sexual Activity: Not on file   Other Topics Concern  . Not on file   Social History Narrative  . No narrative on file    Past Surgical History  Procedure Laterality Date  . Lumbar laminectomy      Family History  Problem Relation Age of Onset  . Colon cancer Father   . Hypertension Father   . Diabetes Father   . Heart attack Father     No Known Allergies  Current Outpatient Prescriptions on File Prior to Visit  Medication Sig Dispense Refill  . citalopram (CELEXA) 40 MG tablet TAKE ONE TABLET BY MOUTH EVERY DAY  90 tablet  3  . LORazepam (ATIVAN) 0.5 MG tablet TAKE ONE TABLET BY MOUTH TWICE DAILY AS NEEDED  60 tablet  2  . omeprazole (PRILOSEC) 40 MG  capsule Take 1 capsule (40 mg total) by mouth daily.  30 capsule  3  . pravastatin (PRAVACHOL) 40 MG tablet TAKE ONE TABLET BY MOUTH ONCE DAILY  90 tablet  1  . VENTOLIN HFA 108 (90 BASE) MCG/ACT inhaler INHALE TWO PUFFS INTO THE LUNGS BY MOUTH EVERY 6 HOURS AS NEEDED FOR  WHEEZING  18 g  3   No current facility-administered medications on file prior to visit.    BP 150/90  Pulse 96  Temp(Src) 98.7 F (37.1 C) (Oral)  Resp 20  Ht 5\' 9"  (1.753 m)  Wt 206 lb (93.441 kg)  BMI 30.41 kg/m2  SpO2 97%       Review of Systems  Constitutional: Negative for fever, chills, appetite change and fatigue.  HENT: Negative for congestion, dental problem, ear pain, hearing loss, sore throat, tinnitus, trouble swallowing and voice change.   Eyes: Negative for pain, discharge and visual disturbance.  Respiratory: Negative for cough, chest tightness, wheezing and stridor.   Cardiovascular: Negative for chest pain, palpitations and leg swelling.  Gastrointestinal: Negative for nausea, vomiting, abdominal pain, diarrhea, constipation, blood in stool and abdominal distention.  Genitourinary: Negative for urgency, hematuria, flank pain, discharge,  difficulty urinating and genital sores.  Musculoskeletal: Negative for arthralgias, back pain, gait problem, joint swelling, myalgias and neck stiffness.  Skin: Negative for rash.  Neurological: Positive for dizziness. Negative for syncope, speech difficulty, weakness, numbness and headaches.  Hematological: Negative for adenopathy. Does not bruise/bleed easily.  Psychiatric/Behavioral: Negative for behavioral problems and dysphoric mood. The patient is not nervous/anxious.        Objective:   Physical Exam  Constitutional: He is oriented to person, place, and time. He appears well-developed.  HENT:  Head: Normocephalic.  Right Ear: External ear normal.  Left Ear: External ear normal.  Both tympanic membranes and canals normal  Eyes: Conjunctivae and  EOM are normal.  Neck: Normal range of motion.  Cardiovascular: Normal rate and normal heart sounds.   Pulmonary/Chest: Breath sounds normal.  Abdominal: Bowel sounds are normal.  Musculoskeletal: Normal range of motion. He exhibits no edema and no tenderness.  Neurological: He is alert and oriented to person, place, and time. No cranial nerve deficit. Coordination normal.  Normal finger to nose testing  Psychiatric: He has a normal mood and affect. His behavior is normal.          Assessment & Plan:   Benign positional vertigo.  Will give instructions for self treatment and also prescription for meclizine Hypertension stable  Note to excuse from work written

## 2014-06-18 ENCOUNTER — Encounter: Payer: Self-pay | Admitting: Internal Medicine

## 2014-06-18 ENCOUNTER — Ambulatory Visit (INDEPENDENT_AMBULATORY_CARE_PROVIDER_SITE_OTHER): Payer: BC Managed Care – PPO | Admitting: Internal Medicine

## 2014-06-18 VITALS — BP 138/90 | HR 89 | Temp 98.8°F | Resp 20 | Ht 69.0 in | Wt 205.0 lb

## 2014-06-18 DIAGNOSIS — R0789 Other chest pain: Secondary | ICD-10-CM

## 2014-06-18 DIAGNOSIS — I10 Essential (primary) hypertension: Secondary | ICD-10-CM

## 2014-06-18 DIAGNOSIS — R071 Chest pain on breathing: Secondary | ICD-10-CM

## 2014-06-18 MED ORDER — ALBUTEROL SULFATE HFA 108 (90 BASE) MCG/ACT IN AERS: INHALATION_SPRAY | RESPIRATORY_TRACT | Status: DC

## 2014-06-18 NOTE — Patient Instructions (Addendum)
Call or return to clinic prn if these symptoms worsen or fail to improve as anticipated.  Smoking tobacco is very bad for your health. You should stop smoking immediately.

## 2014-06-18 NOTE — Progress Notes (Signed)
Pre-visit discussion using our clinic review tool. No additional management support is needed unless otherwise documented below in the visit note.  

## 2014-06-18 NOTE — Progress Notes (Signed)
Subjective:    Patient ID: Trevor Mack, male    DOB: 11-Feb-1956, 58 y.o.   MRN: [id number]  HPI  58 year old patient who has hypertension, dyslipidemia, who is seen today with a chief complaint of left chest wall pain after a fall in the shower 3 days ago.  No shortness of breath. Otherwise, doing quite well.  Past Medical History  Diagnosis Date  . ANXIETY 07/23/2008  . COLONIC POLYPS, HX OF 03/26/2006    MULTIPLE FRAGMENTS OF TUBULAR ADENOMAS POLYPS  . GERD 07/23/2008  . HYPERLIPIDEMIA 07/23/2008  . HYPERTENSION 07/23/2008  . TENDINITIS, LEFT THUMB 07/15/2010  . Family history of colon cancer     Father     History   Social History  . Marital Status: Married    Spouse Name: N/A    Number of Children: 0  . Years of Education: N/A   Occupational History  .  [employer] History Main Topics  . Smoking status: Current Every Day Smoker    Types: Cigarettes  . Smokeless tobacco: Never Used     Comment: Started smoking at age 58.  Currently smoking 1 1/2 ppd.  . Alcohol Use: No  . Drug Use: No  . Sexual Activity: Not on file   Other Topics Concern  . Not on file   Social History Narrative  . No narrative on file    Past Surgical History  Procedure Laterality Date  . Lumbar laminectomy      Family History  Problem Relation Age of Onset  . Colon cancer Father   . Hypertension Father   . Diabetes Father   . Heart attack Father     No Known Allergies  Current Outpatient Prescriptions on File Prior to Visit  Medication Sig Dispense Refill  . citalopram (CELEXA) 40 MG tablet TAKE ONE TABLET BY MOUTH EVERY DAY  90 tablet  3  . lisinopril (PRINIVIL,ZESTRIL) 10 MG tablet Take 10 mg by mouth daily.       Marland Kitchen LORazepam (ATIVAN) 0.5 MG tablet TAKE ONE TABLET BY MOUTH TWICE DAILY AS NEEDED  60 tablet  2  . losartan (COZAAR) 100 MG tablet Take 100 mg by mouth daily.      . meclizine (ANTIVERT) 25 MG tablet Take 1 tablet (25 mg total) by mouth 3  (three) times daily as needed for dizziness.  30 tablet  3  . omeprazole (PRILOSEC) 40 MG capsule Take 1 capsule (40 mg total) by mouth daily.  30 capsule  3  . pravastatin (PRAVACHOL) 40 MG tablet TAKE ONE TABLET BY MOUTH ONCE DAILY  90 tablet  1   No current facility-administered medications on file prior to visit.    BP 138/90  Pulse 89  Temp(Src) 98.8 F (37.1 C) (Oral)  Resp 20  Ht 5\' 9"  (1.753 m)  Wt 205 lb (92.987 kg)  BMI 30.26 kg/m2  SpO2 97%       Review of Systems  Constitutional: Negative for fever, chills, appetite change and fatigue.  HENT: Negative for congestion, dental problem, ear pain, hearing loss, sore throat, tinnitus, trouble swallowing and voice change.   Eyes: Negative for pain, discharge and visual disturbance.  Respiratory: Negative for cough, chest tightness, wheezing and stridor.   Cardiovascular: Positive for chest pain. Negative for palpitations and leg swelling.  Gastrointestinal: Negative for nausea, vomiting, abdominal pain, diarrhea, constipation, blood in stool and abdominal distention.  Genitourinary: Negative for urgency, hematuria, flank pain, discharge, difficulty urinating  and genital sores.  Musculoskeletal: Negative for arthralgias, back pain, gait problem, joint swelling, myalgias and neck stiffness.  Skin: Negative for rash.  Neurological: Negative for dizziness, syncope, speech difficulty, weakness, numbness and headaches.  Hematological: Negative for adenopathy. Does not bruise/bleed easily.  Psychiatric/Behavioral: Negative for behavioral problems and dysphoric mood. The patient is not nervous/anxious.        Objective:   Physical Exam  Constitutional: He is oriented to person, place, and time. He appears well-developed.  Blood pressure 130/86  HENT:  Head: Normocephalic.  Right Ear: External ear normal.  Left Ear: External ear normal.  Eyes: Conjunctivae and EOM are normal.  Neck: Normal range of motion.  Cardiovascular:  Normal rate and normal heart sounds.   Pulmonary/Chest: Effort normal and breath sounds normal. No respiratory distress. He has no wheezes. He has no rales. He exhibits tenderness.  Tenderness left lateral chest wall  Abdominal: Bowel sounds are normal.  Musculoskeletal: Normal range of motion. He exhibits no edema and no tenderness.  Neurological: He is alert and oriented to person, place, and time.  Psychiatric: He has a normal mood and affect. His behavior is normal.          Assessment & Plan:   Chest wall pain.  Rib contusion, versus fracture.  Patient seems fairly comfortable and does not desire analgesics.  Hypertension, stable Tobacco abuse.  Total smoking cessation encouraged

## 2014-07-22 ENCOUNTER — Other Ambulatory Visit: Payer: Self-pay | Admitting: Internal Medicine

## 2014-10-22 ENCOUNTER — Other Ambulatory Visit: Payer: Self-pay | Admitting: Internal Medicine

## 2014-11-02 ENCOUNTER — Other Ambulatory Visit: Payer: Self-pay | Admitting: Internal Medicine

## 2015-01-31 ENCOUNTER — Other Ambulatory Visit: Payer: Self-pay | Admitting: Internal Medicine

## 2015-02-08 ENCOUNTER — Other Ambulatory Visit: Payer: Self-pay | Admitting: Internal Medicine

## 2015-03-14 ENCOUNTER — Other Ambulatory Visit: Payer: Self-pay | Admitting: Internal Medicine

## 2015-03-16 ENCOUNTER — Other Ambulatory Visit: Payer: Self-pay | Admitting: Internal Medicine

## 2015-04-15 ENCOUNTER — Other Ambulatory Visit: Payer: Self-pay | Admitting: Internal Medicine

## 2015-05-06 ENCOUNTER — Other Ambulatory Visit: Payer: Self-pay | Admitting: Internal Medicine

## 2015-05-12 ENCOUNTER — Other Ambulatory Visit: Payer: BC Managed Care – PPO

## 2015-05-17 ENCOUNTER — Encounter: Payer: Self-pay | Admitting: Gastroenterology

## 2015-05-17 ENCOUNTER — Encounter: Payer: BC Managed Care – PPO | Admitting: Internal Medicine

## 2015-06-25 ENCOUNTER — Other Ambulatory Visit (INDEPENDENT_AMBULATORY_CARE_PROVIDER_SITE_OTHER): Payer: BC Managed Care – PPO

## 2015-06-25 DIAGNOSIS — Z0000 Encounter for general adult medical examination without abnormal findings: Secondary | ICD-10-CM | POA: Diagnosis not present

## 2015-06-25 LAB — HEPATIC FUNCTION PANEL
ALT: 21 U/L (ref 0–53)
AST: 14 U/L (ref 0–37)
Albumin: 4.1 g/dL (ref 3.5–5.2)
Alkaline Phosphatase: 102 U/L (ref 39–117)
BILIRUBIN TOTAL: 0.5 mg/dL (ref 0.2–1.2)
Bilirubin, Direct: 0.1 mg/dL (ref 0.0–0.3)
TOTAL PROTEIN: 6.7 g/dL (ref 6.0–8.3)

## 2015-06-25 LAB — POCT URINALYSIS DIPSTICK
Bilirubin, UA: NEGATIVE
Blood, UA: NEGATIVE
GLUCOSE UA: NEGATIVE
Ketones, UA: NEGATIVE
LEUKOCYTES UA: NEGATIVE
NITRITE UA: NEGATIVE
PROTEIN UA: NEGATIVE
Spec Grav, UA: 1.02
UROBILINOGEN UA: 0.2
pH, UA: 7

## 2015-06-25 LAB — CBC WITH DIFFERENTIAL/PLATELET
BASOS ABS: 0 10*3/uL (ref 0.0–0.1)
Basophils Relative: 0.5 % (ref 0.0–3.0)
Eosinophils Absolute: 0.2 10*3/uL (ref 0.0–0.7)
Eosinophils Relative: 2.3 % (ref 0.0–5.0)
HCT: 46.9 % (ref 39.0–52.0)
Hemoglobin: 15.8 g/dL (ref 13.0–17.0)
Lymphocytes Relative: 28.4 % (ref 12.0–46.0)
Lymphs Abs: 2.4 10*3/uL (ref 0.7–4.0)
MCHC: 33.8 g/dL (ref 30.0–36.0)
MCV: 86.5 fl (ref 78.0–100.0)
MONOS PCT: 5.5 % (ref 3.0–12.0)
Monocytes Absolute: 0.5 10*3/uL (ref 0.1–1.0)
Neutro Abs: 5.3 10*3/uL (ref 1.4–7.7)
Neutrophils Relative %: 63.3 % (ref 43.0–77.0)
PLATELETS: 143 10*3/uL — AB (ref 150.0–400.0)
RBC: 5.42 Mil/uL (ref 4.22–5.81)
RDW: 14.6 % (ref 11.5–15.5)
WBC: 8.4 10*3/uL (ref 4.0–10.5)

## 2015-06-25 LAB — BASIC METABOLIC PANEL
BUN: 16 mg/dL (ref 6–23)
CO2: 29 mEq/L (ref 19–32)
Calcium: 9 mg/dL (ref 8.4–10.5)
Chloride: 103 mEq/L (ref 96–112)
Creatinine, Ser: 0.91 mg/dL (ref 0.40–1.50)
GFR: 90.89 mL/min (ref >60.00)
Glucose, Bld: 90 mg/dL (ref 70–99)
POTASSIUM: 4.1 meq/L (ref 3.5–5.1)
Sodium: 141 mEq/L (ref 135–145)

## 2015-06-25 LAB — LIPID PANEL
CHOL/HDL RATIO: 5
Cholesterol: 168 mg/dL (ref 0–200)
HDL: 31.3 mg/dL — ABNORMAL LOW (ref >39.00)
LDL Cholesterol: 101 mg/dL — ABNORMAL HIGH (ref 0–99)
NONHDL: 136.7
Triglycerides: 181 mg/dL — ABNORMAL HIGH (ref 0.0–149.0)
VLDL: 36.2 mg/dL (ref 0.0–40.0)

## 2015-06-25 LAB — PSA: PSA: 1.05 ng/mL (ref 0.10–4.00)

## 2015-06-25 LAB — TSH: TSH: 1.79 u[IU]/mL (ref 0.35–4.50)

## 2015-07-02 ENCOUNTER — Ambulatory Visit (INDEPENDENT_AMBULATORY_CARE_PROVIDER_SITE_OTHER): Payer: BC Managed Care – PPO | Admitting: Internal Medicine

## 2015-07-02 ENCOUNTER — Other Ambulatory Visit: Payer: Self-pay | Admitting: *Deleted

## 2015-07-02 ENCOUNTER — Encounter: Payer: Self-pay | Admitting: Internal Medicine

## 2015-07-02 VITALS — BP 142/90 | HR 120 | Temp 98.6°F | Resp 20 | Ht 69.0 in | Wt 205.0 lb

## 2015-07-02 DIAGNOSIS — E785 Hyperlipidemia, unspecified: Secondary | ICD-10-CM

## 2015-07-02 DIAGNOSIS — K219 Gastro-esophageal reflux disease without esophagitis: Secondary | ICD-10-CM

## 2015-07-02 DIAGNOSIS — Z86010 Personal history of colonic polyps: Secondary | ICD-10-CM | POA: Diagnosis not present

## 2015-07-02 DIAGNOSIS — I10 Essential (primary) hypertension: Secondary | ICD-10-CM | POA: Diagnosis not present

## 2015-07-02 DIAGNOSIS — Z0000 Encounter for general adult medical examination without abnormal findings: Secondary | ICD-10-CM

## 2015-07-02 MED ORDER — CITALOPRAM HYDROBROMIDE 40 MG PO TABS: 40.0000 mg | ORAL_TABLET | Freq: Every day | ORAL | Status: DC

## 2015-07-02 MED ORDER — ONDANSETRON HCL 4 MG PO TABS: 4.0000 mg | ORAL_TABLET | Freq: Three times a day (TID) | ORAL | Status: DC | PRN

## 2015-07-02 MED ORDER — LOSARTAN POTASSIUM 100 MG PO TABS: 100.0000 mg | ORAL_TABLET | Freq: Every day | ORAL | Status: DC

## 2015-07-02 MED ORDER — DIPHENOXYLATE-ATROPINE 2.5-0.025 MG PO TABS: 1.0000 | ORAL_TABLET | Freq: Four times a day (QID) | ORAL | Status: DC | PRN

## 2015-07-02 MED ORDER — LISINOPRIL 10 MG PO TABS: 10.0000 mg | ORAL_TABLET | Freq: Every day | ORAL | Status: DC

## 2015-07-02 MED ORDER — LORAZEPAM 0.5 MG PO TABS: 0.5000 mg | ORAL_TABLET | Freq: Two times a day (BID) | ORAL | Status: DC | PRN

## 2015-07-02 MED ORDER — PRAVASTATIN SODIUM 40 MG PO TABS: 40.0000 mg | ORAL_TABLET | Freq: Every day | ORAL | Status: DC

## 2015-07-02 MED ORDER — OMEPRAZOLE 40 MG PO CPDR: 40.0000 mg | DELAYED_RELEASE_CAPSULE | Freq: Every day | ORAL | Status: DC

## 2015-07-02 NOTE — Progress Notes (Signed)
Subjective:    Patient ID: Trevor Mack, male    DOB: Sep 21, 1956, 59 y.o.   MRN: [id number]  HPI  59 year old patient who is seen today for a wellness exam.  He has a history of treated hypertension and also COPD.  He continues to smoke tobacco products.  He has dyslipidemia treated with statin therapy and also has a history of anxiety.  For the past 3 days, he has had some nausea, vomiting and diarrhea.  Symptoms are starting to improve.  He has been unable to work for the past 3 days due to the GI symptoms  He has a history of colonic polyps and also family history of colon cancer.  Last colonoscopy was December 2013.  He also has a history of Barrett's esophagus.  EDG was terminated due to stridor and respiratory insufficiency.  In December 2013  Alcohol-Tobacco  Smoking Status: current  Allergies (verified): No Known Drug Allergies  Past History:  Anxiety Colonic polyps, hx of Hyperlipidemia Hypertension GERD/Barrett's esophagus COPD-Gold stage C Tobacco abuse  Past Surgical History:  Lumbar laminectomy  colonoscopy 2008 2013   Family History:  mother died at age 47 History of senile dementia of the Alzheimer's type father died age 44, colon cancer, history of diabetes, coronary artery disease, prior MI  Two brothers and two sisters are in good health  BP Readings from Last 3 Encounters:  07/02/15 142/90  06/18/14 138/90  05/11/14 150/90    Social History:  Widow.  His wife died of Alzheimer's dementia  Review of Systems  Constitutional: Negative for fever, chills, activity change, appetite change and fatigue.  HENT: Negative for congestion, dental problem, ear pain, hearing loss, mouth sores, rhinorrhea, sinus pressure, sneezing, tinnitus, trouble swallowing and voice change.   Eyes: Negative for photophobia, pain, redness and visual disturbance.  Respiratory: Negative for apnea, cough, choking, chest tightness, shortness of breath and  wheezing.   Cardiovascular: Negative for chest pain, palpitations and leg swelling.  Gastrointestinal: Positive for nausea, vomiting and diarrhea. Negative for abdominal pain, constipation, blood in stool, abdominal distention, anal bleeding and rectal pain.  Genitourinary: Negative for dysuria, urgency, frequency, hematuria, flank pain, decreased urine volume, discharge, penile swelling, scrotal swelling, difficulty urinating, genital sores and testicular pain.  Musculoskeletal: Negative for myalgias, back pain, joint swelling, arthralgias, gait problem, neck pain and neck stiffness.  Skin: Negative for color change, rash and wound.  Neurological: Negative for dizziness, tremors, seizures, syncope, facial asymmetry, speech difficulty, weakness, light-headedness, numbness and headaches.  Hematological: Negative for adenopathy. Does not bruise/bleed easily.  Psychiatric/Behavioral: Negative for suicidal ideas, hallucinations, behavioral problems, confusion, sleep disturbance, self-injury, dysphoric mood, decreased concentration and agitation. The patient is nervous/anxious.        Objective:   Physical Exam  Constitutional: He appears well-developed and well-nourished.  Blood pressure 120/78 Pulse 100  HENT:  Head: Normocephalic and atraumatic.  Right Ear: External ear normal.  Left Ear: External ear normal.  Nose: Nose normal.  Mouth/Throat: Oropharynx is clear and moist.  Eyes: Conjunctivae and EOM are normal. Pupils are equal, round, and reactive to light. No scleral icterus.  Neck: Normal range of motion. Neck supple. No JVD present. No thyromegaly present.  Cardiovascular: Regular rhythm, normal heart sounds and intact distal pulses.  Exam reveals no gallop and no friction rub.   No murmur heard. Pulmonary/Chest: Effort normal and breath sounds normal. He exhibits no tenderness.  Abdominal: Soft. Bowel sounds are normal. He exhibits no distension and no mass. There  is no tenderness.    Genitourinary: Penis normal.  Musculoskeletal: Normal range of motion. He exhibits no edema or tenderness.  Lymphadenopathy:    He has no cervical adenopathy.  Neurological: He is alert. He has normal reflexes. No cranial nerve deficit. Coordination normal.  Skin: Skin is warm and dry. No rash noted.  Psychiatric: He has a normal mood and affect. His behavior is normal.          Assessment & Plan:   Preventive health examination Viral gastroenteritis.  Will treat symptomatically.  Will place on clear liquid diet and advance as tolerated.  Patient instructions dispensed and discussed Ongoing tobacco use.  Smoking cessation encouraged COPD History colonic polyps.  Follow-up colonoscopy 2018 Barrett's esophagus Dyslipidemia.  Continue statin therapy Anxiety disorder.  Lorazepam refilled Hypertension.  Continue present regimen.  Home blood pressure monitoring.  Encouraged low-salt diet recommended  Recheck one year or as needed

## 2015-07-02 NOTE — Progress Notes (Signed)
Pre visit review using our clinic review tool, if applicable. No additional management support is needed unless otherwise documented below in the visit note. 

## 2015-07-02 NOTE — Patient Instructions (Signed)
Limit your sodium (Salt) intake  Please check your blood pressure on a regular basis.  If it is consistently greater than 150/90, please make an office appointment.    It is important that you exercise regularly, at least 20 minutes 3 to 4 times per week.  If you develop chest pain or shortness of breath seek  medical attention.  Maintain hydration by drinking small amounts of clear fluids frequently, then soft diet, and then advance diet as tolerated. May use OTC Imodium if desired for any diarrhea.  Call if symptoms worsen, high fever, severe weakness or fainting, increased abdominal pain, blood in stool or vomit, or failure to improve in 2-3 days.  Smoking tobacco is very bad for your health. You should stop smoking immediately.  Health Maintenance A healthy lifestyle and preventative care can promote health and wellness.  Maintain regular health, dental, and eye exams.  Eat a healthy diet. Foods like vegetables, fruits, whole grains, low-fat dairy products, and lean protein foods contain the nutrients you need and are low in calories. Decrease your intake of foods high in solid fats, added sugars, and salt. Get information about a proper diet from your health care provider, if necessary.  Regular physical exercise is one of the most important things you can do for your health. Most adults should get at least 150 minutes of moderate-intensity exercise (any activity that increases your heart rate and causes you to sweat) each week. In addition, most adults need muscle-strengthening exercises on 2 or more days a week.   Maintain a healthy weight. The body mass index (BMI) is a screening tool to identify possible weight problems. It provides an estimate of body fat based on height and weight. Your health care provider can find your BMI and can help you achieve or maintain a healthy weight. For males 20 years and older:  A BMI below 18.5 is considered underweight.  A BMI of 18.5 to 24.9 is  normal.  A BMI of 25 to 29.9 is considered overweight.  A BMI of 30 and above is considered obese.  Maintain normal blood lipids and cholesterol by exercising and minimizing your intake of saturated fat. Eat a balanced diet with plenty of fruits and vegetables. Blood tests for lipids and cholesterol should begin at age 48 and be repeated every 5 years. If your lipid or cholesterol levels are high, you are over age 69, or you are at high risk for heart disease, you may need your cholesterol levels checked more frequently.Ongoing high lipid and cholesterol levels should be treated with medicines if diet and exercise are not working.  If you smoke, find out from your health care provider how to quit. If you do not use tobacco, do not start.  Lung cancer screening is recommended for adults aged 48-80 years who are at high risk for developing lung cancer because of a history of smoking. A yearly low-dose CT scan of the lungs is recommended for people who have at least a 30-pack-year history of smoking and are current smokers or have quit within the past 15 years. A pack year of smoking is smoking an average of 1 pack of cigarettes a day for 1 year (for example, a 30-pack-year history of smoking could mean smoking 1 pack a day for 30 years or 2 packs a day for 15 years). Yearly screening should continue until the smoker has stopped smoking for at least 15 years. Yearly screening should be stopped for people who develop a health  problem that would prevent them from having lung cancer treatment.  If you choose to drink alcohol, do not have more than 2 drinks per day. One drink is considered to be 12 oz (360 mL) of beer, 5 oz (150 mL) of wine, or 1.5 oz (45 mL) of liquor.  Avoid the use of street drugs. Do not share needles with anyone. Ask for help if you need support or instructions about stopping the use of drugs.  High blood pressure causes heart disease and increases the risk of stroke. Blood pressure  should be checked at least every 1-2 years. Ongoing high blood pressure should be treated with medicines if weight loss and exercise are not effective.  If you are 72-30 years old, ask your health care provider if you should take aspirin to prevent heart disease.  Diabetes screening involves taking a blood sample to check your fasting blood sugar level. This should be done once every 3 years after age 70 if you are at a normal weight and without risk factors for diabetes. Testing should be considered at a younger age or be carried out more frequently if you are overweight and have at least 1 risk factor for diabetes.  Colorectal cancer can be detected and often prevented. Most routine colorectal cancer screening begins at the age of 65 and continues through age 65. However, your health care provider may recommend screening at an earlier age if you have risk factors for colon cancer. On a yearly basis, your health care provider may provide home test kits to check for hidden blood in the stool. A small camera at the end of a tube may be used to directly examine the colon (sigmoidoscopy or colonoscopy) to detect the earliest forms of colorectal cancer. Talk to your health care provider about this at age 62 when routine screening begins. A direct exam of the colon should be repeated every 5-10 years through age 72, unless early forms of precancerous polyps or small growths are found.  People who are at an increased risk for hepatitis B should be screened for this virus. You are considered at high risk for hepatitis B if:  You were born in a country where hepatitis B occurs often. Talk with your health care provider about which countries are considered high risk.  Your parents were born in a high-risk country and you have not received a shot to protect against hepatitis B (hepatitis B vaccine).  You have HIV or AIDS.  You use needles to inject street drugs.  You live with, or have sex with, someone who  has hepatitis B.  You are a man who has sex with other men (MSM).  You get hemodialysis treatment.  You take certain medicines for conditions like cancer, organ transplantation, and autoimmune conditions.  Hepatitis C blood testing is recommended for all people born from [birth year] through 1964 and any individual with known risk factors for hepatitis C.  Healthy men should no longer receive prostate-specific antigen (PSA) blood tests as part of routine cancer screening. Talk to your health care provider about prostate cancer screening.  Testicular cancer screening is not recommended for adolescents or adult males who have no symptoms. Screening includes self-exam, a health care provider exam, and other screening tests. Consult with your health care provider about any symptoms you have or any concerns you have about testicular cancer.  Practice safe sex. Use condoms and avoid high-risk sexual practices to reduce the spread of sexually transmitted infections (STIs).  You should  be screened for STIs, including gonorrhea and chlamydia if:  You are sexually active and are younger than 24 years.  You are older than 24 years, and your health care provider tells you that you are at risk for this type of infection.  Your sexual activity has changed since you were last screened, and you are at an increased risk for chlamydia or gonorrhea. Ask your health care provider if you are at risk.  If you are at risk of being infected with HIV, it is recommended that you take a prescription medicine daily to prevent HIV infection. This is called pre-exposure prophylaxis (PrEP). You are considered at risk if:  You are a man who has sex with other men (MSM).  You are a heterosexual man who is sexually active with multiple partners.  You take drugs by injection.  You are sexually active with a partner who has HIV.  Talk with your health care provider about whether you are at high risk of being infected with  HIV. If you choose to begin PrEP, you should first be tested for HIV. You should then be tested every 3 months for as long as you are taking PrEP.  Use sunscreen. Apply sunscreen liberally and repeatedly throughout the day. You should seek shade when your shadow is shorter than you. Protect yourself by wearing long sleeves, pants, a wide-brimmed hat, and sunglasses year round whenever you are outdoors.  Tell your health care provider of new moles or changes in moles, especially if there is a change in shape or color. Also, tell your health care provider if a mole is larger than the size of a pencil eraser.  A one-time screening for abdominal aortic aneurysm (AAA) and surgical repair of large AAAs by ultrasound is recommended for men aged 58-75 years who are current or former smokers.  Stay current with your vaccines (immunizations). Document Released: 06/08/2008 Document Revised: 12/16/2013 Document Reviewed: 05/08/2011 [facility] Patient Information 2015 [place], [place]. This information is not intended to replace advice given to you by your health care provider. Make sure you discuss any questions you have with your health care provider.

## 2015-07-06 ENCOUNTER — Encounter: Payer: Self-pay | Admitting: Genetic Counselor

## 2015-10-05 ENCOUNTER — Encounter: Payer: Self-pay | Admitting: Internal Medicine

## 2015-10-05 ENCOUNTER — Ambulatory Visit (INDEPENDENT_AMBULATORY_CARE_PROVIDER_SITE_OTHER): Payer: BC Managed Care – PPO | Admitting: Internal Medicine

## 2015-10-05 VITALS — BP 150/90 | HR 86 | Temp 98.4°F | Resp 20 | Ht 69.0 in | Wt 207.0 lb

## 2015-10-05 DIAGNOSIS — E785 Hyperlipidemia, unspecified: Secondary | ICD-10-CM | POA: Diagnosis not present

## 2015-10-05 DIAGNOSIS — Z720 Tobacco use: Secondary | ICD-10-CM

## 2015-10-05 DIAGNOSIS — J0120 Acute ethmoidal sinusitis, unspecified: Secondary | ICD-10-CM | POA: Diagnosis not present

## 2015-10-05 DIAGNOSIS — I10 Essential (primary) hypertension: Secondary | ICD-10-CM

## 2015-10-05 DIAGNOSIS — L03011 Cellulitis of right finger: Secondary | ICD-10-CM

## 2015-10-05 MED ORDER — AMOXICILLIN-POT CLAVULANATE 875-125 MG PO TABS: 1.0000 | ORAL_TABLET | Freq: Two times a day (BID) | ORAL | Status: DC

## 2015-10-05 NOTE — Progress Notes (Signed)
Subjective:    Patient ID: Trevor Mack, male    DOB: 04-20-1956, 60 y.o.   MRN: [id number]  HPI  59 year old patient who has a history of COPD and ongoing tobacco use.  He has tapered his tobacco consumption somewhat but is still at 1 and a half packs per day.  He presents with a several-day history of left maxillary sinus pain.  He has had some drainage from the left nares. He also complains of pain and swelling involving his right index finger. He has essential hypertension and a history of dyslipidemia.  No fever.  Past Medical History  Diagnosis Date  . ANXIETY 07/23/2008  . COLONIC POLYPS, HX OF 03/26/2006    MULTIPLE FRAGMENTS OF TUBULAR ADENOMAS POLYPS  . GERD 07/23/2008  . HYPERLIPIDEMIA 07/23/2008  . HYPERTENSION 07/23/2008  . TENDINITIS, LEFT THUMB 07/15/2010  . Family history of colon cancer     Father     Social History   Social History  . Marital Status: Married    Spouse Name: N/A  . Number of Children: 0  . Years of Education: N/A   Occupational History  .  [employer] History Main Topics  . Smoking status: Current Every Day Smoker    Types: Cigarettes  . Smokeless tobacco: Never Used     Comment: Started smoking at age 82.  Currently smoking 1 1/2 ppd.  . Alcohol Use: No  . Drug Use: No  . Sexual Activity: Not on file   Other Topics Concern  . Not on file   Social History Narrative    Past Surgical History  Procedure Laterality Date  . Lumbar laminectomy      Family History  Problem Relation Age of Onset  . Colon cancer Father   . Hypertension Father   . Diabetes Father   . Heart attack Father     No Known Allergies  Current Outpatient Prescriptions on File Prior to Visit  Medication Sig Dispense Refill  . albuterol (VENTOLIN HFA) 108 (90 BASE) MCG/ACT inhaler INHALE TWO PUFFS INTO THE LUNGS BY MOUTH EVERY 6 HOURS AS NEEDED FOR  WHEEZING 18 g 5  . citalopram (CELEXA) 40 MG tablet Take 1 tablet (40 mg total) by  mouth daily. 90 tablet 3  . diphenoxylate-atropine (LOMOTIL) 2.5-0.025 MG per tablet Take 1 tablet by mouth 4 (four) times daily as needed for diarrhea or loose stools. 30 tablet 0  . LORazepam (ATIVAN) 0.5 MG tablet Take 1 tablet (0.5 mg total) by mouth 2 (two) times daily as needed. 180 tablet 1  . losartan (COZAAR) 100 MG tablet Take 1 tablet (100 mg total) by mouth daily. 90 tablet 4  . omeprazole (PRILOSEC) 40 MG capsule Take 1 capsule (40 mg total) by mouth daily. 90 capsule 3  . ondansetron (ZOFRAN) 4 MG tablet Take 1 tablet (4 mg total) by mouth every 8 (eight) hours as needed for nausea or vomiting. 20 tablet 0  . pravastatin (PRAVACHOL) 40 MG tablet Take 1 tablet (40 mg total) by mouth daily. 90 tablet 3   No current facility-administered medications on file prior to visit.    BP 150/90 mmHg  Pulse 86  Temp(Src) 98.4 F (36.9 C) (Oral)  Resp 20  Ht 5\' 9"  (1.753 m)  Wt 207 lb (93.895 kg)  BMI 30.55 kg/m2  SpO2 98%     Review of Systems  Constitutional: Negative for fever, chills, appetite change and fatigue.  HENT: Positive for congestion,  rhinorrhea and sinus pressure. Negative for dental problem, ear pain, hearing loss, sore throat, tinnitus, trouble swallowing and voice change.        Left facial pain  Eyes: Negative for pain, discharge and visual disturbance.  Respiratory: Negative for cough, chest tightness, wheezing and stridor.   Cardiovascular: Negative for chest pain, palpitations and leg swelling.  Gastrointestinal: Negative for nausea, vomiting, abdominal pain, diarrhea, constipation, blood in stool and abdominal distention.  Genitourinary: Negative for urgency, hematuria, flank pain, discharge, difficulty urinating and genital sores.  Musculoskeletal: Negative for myalgias, back pain, joint swelling, arthralgias, gait problem and neck stiffness.  Skin: Positive for wound. Negative for rash.  Neurological: Negative for dizziness, syncope, speech difficulty,  weakness, numbness and headaches.  Hematological: Negative for adenopathy. Does not bruise/bleed easily.  Psychiatric/Behavioral: Negative for behavioral problems and dysphoric mood. The patient is not nervous/anxious.        Objective:   Physical Exam  Constitutional: He is oriented to person, place, and time. He appears well-developed and well-nourished. No distress.  HENT:  Head: Normocephalic.  Right Ear: External ear normal.  Left Ear: External ear normal.  Tenderness left maxillary sinus area  Eyes: Conjunctivae and EOM are normal.  Neck: Normal range of motion.  Cardiovascular: Normal rate and normal heart sounds.   Pulmonary/Chest: Breath sounds normal.  Abdominal: Bowel sounds are normal.  Musculoskeletal: Normal range of motion. He exhibits no edema or tenderness.  Neurological: He is alert and oriented to person, place, and time.  Skin:  Erythema and soft tissue swelling involving the right index finger dorsal aspect  Psychiatric: He has a normal mood and affect. His behavior is normal.          Assessment & Plan:   Cellulitis right index finger.  Will treat with Augmentin Sinusitis.  Will place on expectorants decongestants, saline wash Hypertension, stable Tobacco abuse.  Total smoking cessation encouraged

## 2015-10-05 NOTE — Progress Notes (Signed)
Pre visit review using our clinic review tool, if applicable. No additional management support is needed unless otherwise documented below in the visit note. 

## 2015-10-05 NOTE — Patient Instructions (Signed)
    Use saline irrigation, warm  moist compresses and over-the-counter decongestants only as directed.  Call if there is no improvement in 5 to 7 days, or sooner if you develop increasing pain, fever, or any new symptoms.  Take your antibiotic as prescribed until ALL of it is gone, but stop if you develop a rash, swelling, or any side effects of the medication.  Contact our office as soon as possible if  there are side effects of the medication. 

## 2016-01-31 ENCOUNTER — Other Ambulatory Visit: Payer: Self-pay | Admitting: Internal Medicine

## 2016-02-29 ENCOUNTER — Ambulatory Visit (INDEPENDENT_AMBULATORY_CARE_PROVIDER_SITE_OTHER): Payer: BC Managed Care – PPO | Admitting: Internal Medicine

## 2016-02-29 ENCOUNTER — Telehealth: Payer: Self-pay | Admitting: Internal Medicine

## 2016-02-29 ENCOUNTER — Encounter: Payer: Self-pay | Admitting: Internal Medicine

## 2016-02-29 VITALS — BP 126/80 | HR 101 | Temp 98.9°F | Resp 22 | Ht 69.0 in | Wt 206.0 lb

## 2016-02-29 DIAGNOSIS — I10 Essential (primary) hypertension: Secondary | ICD-10-CM | POA: Diagnosis not present

## 2016-02-29 DIAGNOSIS — K219 Gastro-esophageal reflux disease without esophagitis: Secondary | ICD-10-CM

## 2016-02-29 DIAGNOSIS — J441 Chronic obstructive pulmonary disease with (acute) exacerbation: Secondary | ICD-10-CM

## 2016-02-29 DIAGNOSIS — Z86010 Personal history of colonic polyps: Secondary | ICD-10-CM | POA: Diagnosis not present

## 2016-02-29 DIAGNOSIS — R195 Other fecal abnormalities: Secondary | ICD-10-CM

## 2016-02-29 DIAGNOSIS — Z720 Tobacco use: Secondary | ICD-10-CM

## 2016-02-29 MED ORDER — ALBUTEROL SULFATE HFA 108 (90 BASE) MCG/ACT IN AERS: 2.0000 | INHALATION_SPRAY | Freq: Four times a day (QID) | RESPIRATORY_TRACT | Status: DC | PRN

## 2016-02-29 MED ORDER — SUCRALFATE 1 G PO TABS: 1.0000 g | ORAL_TABLET | Freq: Three times a day (TID) | ORAL | Status: DC

## 2016-02-29 MED ORDER — AZITHROMYCIN 250 MG PO TABS: ORAL_TABLET | ORAL | Status: DC

## 2016-02-29 MED ORDER — ALBUTEROL SULFATE HFA 108 (90 BASE) MCG/ACT IN AERS: INHALATION_SPRAY | RESPIRATORY_TRACT | Status: DC

## 2016-02-29 NOTE — Patient Instructions (Addendum)
Smoking tobacco is very bad for your health. You should stop smoking immediately.    Take over-the-counter expectorants and cough medications such as  Mucinex DM.  Call if there is no improvement in 5 to 7 days or if  you develop worsening cough, fever, or new symptoms, such as shortness of breath or chest pain.  Chronic Obstructive Pulmonary Disease Exacerbation Chronic obstructive pulmonary disease (COPD) is a common lung problem. In COPD, the flow of air from the lungs is limited. COPD exacerbations are times that breathing gets worse and you need extra treatment. Without treatment they can be life threatening. If they happen often, your lungs can become more damaged. If your COPD gets worse, your doctor may treat you with:  Medicines.  Oxygen.  Different ways to clear your airway, such as using a mask. HOME CARE  Do not smoke.  Avoid tobacco smoke and other things that bother your lungs.  If given, take your antibiotic medicine as told. Finish the medicine even if you start to feel better.  Only take medicines as told by your doctor.  Drink enough fluids to keep your pee (urine) clear or pale yellow (unless your doctor has told you not to).  Use a cool mist machine (vaporizer).  If you use oxygen or a machine that turns liquid medicine into a mist (nebulizer), continue to use them as told.  Keep up with shots (vaccinations) as told by your doctor.  Exercise regularly.  Eat healthy foods.  Keep all doctor visits as told. GET HELP RIGHT AWAY IF:  You are very short of breath and it gets worse.  You have trouble talking.  You have bad chest pain.  You have blood in your spit (sputum).  You have a fever.  You keep throwing up (vomiting).  You feel weak, or you pass out (faint).  You feel confused.  You keep getting worse. MAKE SURE YOU:  Understand these instructions.  Will watch your condition.  Will get help right away if you are not doing well or get  worse.   This information is not intended to replace advice given to you by your health care provider. Make sure you discuss any questions you have with your health care provider.   Document Released: 11/30/2011 Document Revised: 01/01/2015 Document Reviewed: 08/15/2013 Elsevier Interactive Patient Education [employer].   Report to the emergency room if black stools persist or you develop progressive weakness, or shortness of breath  Return in 3 days for follow-up (morning appointment)  Hold citalopram for 2 weeks  Avoid all anti-inflammatory medications such as aspirin, Aleve, Advil, ibuprofen

## 2016-02-29 NOTE — Progress Notes (Signed)
Subjective:    Patient ID: Trevor Mack, male    DOB: [birth year], 60 y.o.   MRN: [id number]  HPI 60 year old patient who was stable until about 2 weeks ago.  At that time he developed some cough, chest congestion and body aches.  He seemed to improve but 3 days ago developed worsening cough with episodic wheezing.  Achiness, chills and progressive weakness.  He has missed 2 days of work.  Cough is largely nonproductive He has been out of albuterol.  He continues to be a 2 pack per day smoker. He states that he has had 2 bowel movements today that were quite dark.  Cough and cold medications have included both Advil and Aleve He does have a history of GERD and has had prior upper endoscopies in the past.  His last colonoscopy was December 2014; denies any abdominal pain.  Past Medical History  Diagnosis Date  . ANXIETY 07/23/2008  . COLONIC POLYPS, HX OF 03/26/2006    MULTIPLE FRAGMENTS OF TUBULAR ADENOMAS POLYPS  . GERD 07/23/2008  . HYPERLIPIDEMIA 07/23/2008  . HYPERTENSION 07/23/2008  . TENDINITIS, LEFT THUMB 07/15/2010  . Family history of colon cancer     Father     Social History   Social History  . Marital Status: Married    Spouse Name: N/A  . Number of Children: 0  . Years of Education: N/A   Occupational History  .  [employer] History Main Topics  . Smoking status: Current Every Day Smoker    Types: Cigarettes  . Smokeless tobacco: Never Used     Comment: Started smoking at age 15.  Currently smoking 1 1/2 ppd.  . Alcohol Use: No  . Drug Use: No  . Sexual Activity: Not on file   Other Topics Concern  . Not on file   Social History Narrative    Past Surgical History  Procedure Laterality Date  . Lumbar laminectomy      Family History  Problem Relation Age of Onset  . Colon cancer Father   . Hypertension Father   . Diabetes Father   . Heart attack Father     No Known Allergies  Current Outpatient Prescriptions on File Prior to  Visit  Medication Sig Dispense Refill  . albuterol (VENTOLIN HFA) 108 (90 BASE) MCG/ACT inhaler INHALE TWO PUFFS INTO THE LUNGS BY MOUTH EVERY 6 HOURS AS NEEDED FOR  WHEEZING 18 g 5  . citalopram (CELEXA) 40 MG tablet Take 1 tablet (40 mg total) by mouth daily. 90 tablet 3  . diphenoxylate-atropine (LOMOTIL) 2.5-0.025 MG per tablet Take 1 tablet by mouth 4 (four) times daily as needed for diarrhea or loose stools. 30 tablet 0  . LORazepam (ATIVAN) 0.5 MG tablet TAKE ONE TABLET BY MOUTH TWICE DAILY AS NEEDED 180 tablet 1  . losartan (COZAAR) 100 MG tablet Take 1 tablet (100 mg total) by mouth daily. 90 tablet 4  . omeprazole (PRILOSEC) 40 MG capsule Take 1 capsule (40 mg total) by mouth daily. 90 capsule 3  . ondansetron (ZOFRAN) 4 MG tablet Take 1 tablet (4 mg total) by mouth every 8 (eight) hours as needed for nausea or vomiting. 20 tablet 0  . pravastatin (PRAVACHOL) 40 MG tablet Take 1 tablet (40 mg total) by mouth daily. 90 tablet 3   No current facility-administered medications on file prior to visit.    BP 126/80 mmHg  Pulse 101  Temp(Src) 98.9 F (37.2 C) (Oral)  Resp 22  Ht 5\' 9"  (1.753 m)  Wt 206 lb (93.441 kg)  BMI 30.41 kg/m2  SpO2 95%      Review of Systems  Constitutional: Positive for chills, activity change, appetite change and fatigue. Negative for fever.  HENT: Positive for congestion, postnasal drip and sinus pressure. Negative for dental problem, ear pain, hearing loss, sore throat, tinnitus, trouble swallowing and voice change.   Eyes: Negative for pain, discharge and visual disturbance.  Respiratory: Positive for cough, shortness of breath and wheezing. Negative for chest tightness and stridor.   Cardiovascular: Negative for chest pain, palpitations and leg swelling.  Gastrointestinal: Negative for nausea, vomiting, abdominal pain, diarrhea, constipation, blood in stool and abdominal distention.       2 episodes of dark stool today  Genitourinary: Negative for  urgency, hematuria, flank pain, discharge, difficulty urinating and genital sores.  Musculoskeletal: Negative for myalgias, back pain, joint swelling, arthralgias, gait problem and neck stiffness.  Skin: Negative for rash.  Neurological: Positive for headaches. Negative for dizziness, syncope, speech difficulty, weakness and numbness.  Hematological: Negative for adenopathy. Does not bruise/bleed easily.  Psychiatric/Behavioral: Negative for behavioral problems and dysphoric mood. The patient is not nervous/anxious.        Objective:   Physical Exam  Constitutional: He is oriented to person, place, and time. He appears well-developed.  Appears unwell, but in no acute distress Afebrile O2 sat ration 95  HENT:  Head: Normocephalic.  Right Ear: External ear normal.  Left Ear: External ear normal.  Well-hydrated Edentulous  Eyes: Conjunctivae and EOM are normal.  Neck: Normal range of motion.  Cardiovascular: Normal rate and normal heart sounds.   Pulse rate improved to 90  Pulmonary/Chest: Breath sounds normal. No respiratory distress. He has no wheezes.  Rare scattered rhonchi.  No active wheezing.  O2 saturation 95%  Abdominal: Bowel sounds are normal. He exhibits no distension. There is no tenderness. There is no rebound and no guarding.  Genitourinary: Guaiac positive stool.  Still was dark brown, but hematest positive  Musculoskeletal: Normal range of motion. He exhibits no edema or tenderness.  Neurological: He is alert and oriented to person, place, and time.  Psychiatric: He has a normal mood and affect. His behavior is normal.          Assessment & Plan:   Exacerbation COPD.  Will treat with expectorants.  Azithromycin.  Albuterol refilled.  Smoking cessation encouraged Hematest positive stool.  Patient has had fairly recent colonoscopy.  Suspect upper GI source from anti-inflammatory drug use.  This will be discontinued.  We'll continue a PPI therapy and Carafate  added.  Will check a CBC today and repeat in 3 days. Will report to the ED for evaluation if  black stool persists or if he develops progressive weakness, or shortness of breath

## 2016-02-29 NOTE — Telephone Encounter (Signed)
Called pharmacy and spoke to pharmacist, told her Dr.K said it was suppose to be 30 tablets and to take till all gone. Also okay to change inhaler to ProAir.

## 2016-02-29 NOTE — Telephone Encounter (Signed)
[facility] called and needs clarification: sucralfate (CARAFATE) 1 g tablet Please clarify instructions:               3 tabs - Route: Take 1 tablet (1 g total) by mouth 4 (four) times daily - with meals and at bedtime. - Oral  And the albuterol (VENTOLIN HFA) 108 (90 Base) MCG/ACT inhaler  insurance is only wanting to prescribe Pro air. Is it ok to switch?  [facility]/ [place], [place]

## 2016-02-29 NOTE — Progress Notes (Signed)
Pre visit review using our clinic review tool, if applicable. No additional management support is needed unless otherwise documented below in the visit note. 

## 2016-03-03 ENCOUNTER — Encounter: Payer: Self-pay | Admitting: Internal Medicine

## 2016-03-03 ENCOUNTER — Ambulatory Visit (INDEPENDENT_AMBULATORY_CARE_PROVIDER_SITE_OTHER): Payer: BC Managed Care – PPO | Admitting: Internal Medicine

## 2016-03-03 VITALS — BP 140/80 | HR 98 | Temp 98.1°F | Resp 20 | Ht 69.0 in | Wt 205.0 lb

## 2016-03-03 DIAGNOSIS — J441 Chronic obstructive pulmonary disease with (acute) exacerbation: Secondary | ICD-10-CM | POA: Diagnosis not present

## 2016-03-03 DIAGNOSIS — I10 Essential (primary) hypertension: Secondary | ICD-10-CM | POA: Diagnosis not present

## 2016-03-03 DIAGNOSIS — R195 Other fecal abnormalities: Secondary | ICD-10-CM

## 2016-03-03 LAB — CBC WITH DIFFERENTIAL/PLATELET
BASOS ABS: 0 10*3/uL (ref 0.0–0.1)
Basophils Relative: 0.5 % (ref 0.0–3.0)
EOS ABS: 0.1 10*3/uL (ref 0.0–0.7)
Eosinophils Relative: 2 % (ref 0.0–5.0)
HEMATOCRIT: 46.7 % (ref 39.0–52.0)
HEMOGLOBIN: 15.8 g/dL (ref 13.0–17.0)
LYMPHS PCT: 30.2 % (ref 12.0–46.0)
Lymphs Abs: 1.6 10*3/uL (ref 0.7–4.0)
MCHC: 33.8 g/dL (ref 30.0–36.0)
MCV: 84.8 fl (ref 78.0–100.0)
MONO ABS: 0.4 10*3/uL (ref 0.1–1.0)
Monocytes Relative: 7.6 % (ref 3.0–12.0)
Neutro Abs: 3.2 10*3/uL (ref 1.4–7.7)
Neutrophils Relative %: 59.7 % (ref 43.0–77.0)
Platelets: 107 10*3/uL — ABNORMAL LOW (ref 150.0–400.0)
RBC: 5.51 Mil/uL (ref 4.22–5.81)
RDW: 15.5 % (ref 11.5–15.5)
WBC: 5.3 10*3/uL (ref 4.0–10.5)

## 2016-03-03 NOTE — Progress Notes (Signed)
Subjective:    Patient ID: Trevor Mack, male    DOB: [birth year], 60 y.o.   MRN: [id number]  HPI 60 year old patient who was seen 3 days ago with an exacerbation of COPD.  This has improved but he still has some cough and chest congestion He give a history at that time of dark stool and clinical examination did reveal hematest positive stool.  He has a history colonic polyps and has had fairly recent colonoscopy.  Carafate was added to PPI therapy and he is seen here today in follow-up.  The patient did not have a CBC performed 3 days ago as ordered Stools are a bit loose but no longer dark  Past Medical History  Diagnosis Date  . ANXIETY 07/23/2008  . COLONIC POLYPS, HX OF 03/26/2006    MULTIPLE FRAGMENTS OF TUBULAR ADENOMAS POLYPS  . GERD 07/23/2008  . HYPERLIPIDEMIA 07/23/2008  . HYPERTENSION 07/23/2008  . TENDINITIS, LEFT THUMB 07/15/2010  . Family history of colon cancer     Father     Social History   Social History  . Marital Status: Married    Spouse Name: N/A  . Number of Children: 0  . Years of Education: N/A   Occupational History  .  [place] History Main Topics  . Smoking status: Current Every Day Smoker    Types: Cigarettes  . Smokeless tobacco: Never Used     Comment: Started smoking at age 60.  Currently smoking 1 1/2 ppd.  . Alcohol Use: No  . Drug Use: No  . Sexual Activity: Not on file   Other Topics Concern  . Not on file   Social History Narrative    Past Surgical History  Procedure Laterality Date  . Lumbar laminectomy      Family History  Problem Relation Age of Onset  . Colon cancer Father   . Hypertension Father   . Diabetes Father   . Heart attack Father     No Known Allergies  Current Outpatient Prescriptions on File Prior to Visit  Medication Sig Dispense Refill  . albuterol (PROAIR HFA) 108 (90 Base) MCG/ACT inhaler Inhale 2 puffs into the lungs every 6 (six) hours as needed for wheezing or shortness  of breath. 18 g 5  . citalopram (CELEXA) 40 MG tablet Take 1 tablet (40 mg total) by mouth daily. 90 tablet 3  . diphenoxylate-atropine (LOMOTIL) 2.5-0.025 MG per tablet Take 1 tablet by mouth 4 (four) times daily as needed for diarrhea or loose stools. 30 tablet 0  . ibuprofen (ADVIL) 200 MG tablet Take 400 mg by mouth every 6 (six) hours as needed.    Marland Kitchen LORazepam (ATIVAN) 0.5 MG tablet TAKE ONE TABLET BY MOUTH TWICE DAILY AS NEEDED 180 tablet 1  . losartan (COZAAR) 100 MG tablet Take 1 tablet (100 mg total) by mouth daily. 90 tablet 4  . omeprazole (PRILOSEC) 40 MG capsule Take 1 capsule (40 mg total) by mouth daily. 90 capsule 3  . ondansetron (ZOFRAN) 4 MG tablet Take 1 tablet (4 mg total) by mouth every 8 (eight) hours as needed for nausea or vomiting. 20 tablet 0  . pravastatin (PRAVACHOL) 40 MG tablet Take 1 tablet (40 mg total) by mouth daily. 90 tablet 3  . sucralfate (CARAFATE) 1 g tablet Take 1 tablet (1 g total) by mouth 4 (four) times daily -  with meals and at bedtime. 30 tablet 0   No current facility-administered medications on file  prior to visit.    BP 140/80 mmHg  Pulse 98  Temp(Src) 98.1 F (36.7 C) (Oral)  Resp 20  Ht 5\' 9"  (1.753 m)  Wt 205 lb (92.987 kg)  BMI 30.26 kg/m2  SpO2 96%      Review of Systems  Constitutional: Negative for fever, chills, appetite change and fatigue.  HENT: Negative for congestion, dental problem, ear pain, hearing loss, sore throat, tinnitus, trouble swallowing and voice change.   Eyes: Negative for pain, discharge and visual disturbance.  Respiratory: Positive for cough, shortness of breath and wheezing. Negative for chest tightness and stridor.   Cardiovascular: Negative for chest pain, palpitations and leg swelling.  Gastrointestinal: Negative for nausea, vomiting, abdominal pain, diarrhea, constipation, blood in stool and abdominal distention.  Genitourinary: Negative for urgency, hematuria, flank pain, discharge, difficulty  urinating and genital sores.  Musculoskeletal: Negative for myalgias, back pain, joint swelling, arthralgias, gait problem and neck stiffness.  Skin: Negative for rash.  Neurological: Negative for dizziness, syncope, speech difficulty, weakness, numbness and headaches.  Hematological: Negative for adenopathy. Does not bruise/bleed easily.  Psychiatric/Behavioral: Negative for behavioral problems and dysphoric mood. The patient is not nervous/anxious.        Objective:   Physical Exam  Constitutional: He is oriented to person, place, and time. He appears well-developed.  HENT:  Head: Normocephalic.  Right Ear: External ear normal.  Left Ear: External ear normal.  Eyes: Conjunctivae and EOM are normal.  Neck: Normal range of motion.  Cardiovascular: Normal rate and normal heart sounds.   Pulmonary/Chest:  Still with coarse diffuse rhonchi No increased work of breathing  Abdominal: Bowel sounds are normal.  Musculoskeletal: Normal range of motion. He exhibits no edema or tenderness.  Neurological: He is alert and oriented to person, place, and time.  Psychiatric: He has a normal mood and affect. His behavior is normal.          Assessment & Plan:   History of hematest positive stool.  We'll check CBC today.  Will check stool for occult blood times 4 Exacerbation COPD improved Hypertension, stable

## 2016-03-03 NOTE — Patient Instructions (Addendum)
Limit your sodium (Salt) intake  Smoking tobacco is very bad for your health. You should stop smoking immediately.  Return in 3 months for follow-up  Please mail back  slides to check stool for hidden blood

## 2016-03-03 NOTE — Progress Notes (Signed)
Pre visit review using our clinic review tool, if applicable. No additional management support is needed unless otherwise documented below in the visit note. 

## 2016-03-22 ENCOUNTER — Other Ambulatory Visit: Payer: Self-pay | Admitting: Family Medicine

## 2016-03-22 MED ORDER — PRAVASTATIN SODIUM 40 MG PO TABS: 40.0000 mg | ORAL_TABLET | Freq: Every day | ORAL | Status: DC

## 2016-03-22 MED ORDER — LOSARTAN POTASSIUM 100 MG PO TABS: 100.0000 mg | ORAL_TABLET | Freq: Every day | ORAL | Status: DC

## 2016-03-22 MED ORDER — ALBUTEROL SULFATE HFA 108 (90 BASE) MCG/ACT IN AERS: 2.0000 | INHALATION_SPRAY | Freq: Four times a day (QID) | RESPIRATORY_TRACT | Status: DC | PRN

## 2016-03-22 MED ORDER — CITALOPRAM HYDROBROMIDE 40 MG PO TABS: 40.0000 mg | ORAL_TABLET | Freq: Every day | ORAL | Status: DC

## 2016-03-24 MED ORDER — LORAZEPAM 0.5 MG PO TABS: 0.5000 mg | ORAL_TABLET | Freq: Two times a day (BID) | ORAL | Status: DC | PRN

## 2016-06-14 ENCOUNTER — Other Ambulatory Visit: Payer: Self-pay

## 2016-06-14 MED ORDER — LOSARTAN POTASSIUM 100 MG PO TABS: 100.0000 mg | ORAL_TABLET | Freq: Every day | ORAL | Status: DC

## 2016-06-14 MED ORDER — PRAVASTATIN SODIUM 40 MG PO TABS: 40.0000 mg | ORAL_TABLET | Freq: Every day | ORAL | Status: DC

## 2016-06-20 ENCOUNTER — Other Ambulatory Visit: Payer: Self-pay | Admitting: Internal Medicine

## 2016-06-21 ENCOUNTER — Other Ambulatory Visit: Payer: Self-pay | Admitting: Family Medicine

## 2016-06-21 MED ORDER — ALBUTEROL SULFATE HFA 108 (90 BASE) MCG/ACT IN AERS: 2.0000 | INHALATION_SPRAY | Freq: Four times a day (QID) | RESPIRATORY_TRACT | Status: DC | PRN

## 2016-06-21 NOTE — Telephone Encounter (Signed)
Refill request for Proair inhaler and a 90 day supply to [facility], script was sent e-scribe.

## 2016-07-07 ENCOUNTER — Encounter: Payer: Self-pay | Admitting: Genetic Counselor

## 2016-08-24 ENCOUNTER — Other Ambulatory Visit: Payer: Self-pay | Admitting: Internal Medicine

## 2016-09-05 ENCOUNTER — Telehealth: Payer: Self-pay | Admitting: Internal Medicine

## 2016-09-05 MED ORDER — ALBUTEROL SULFATE HFA 108 (90 BASE) MCG/ACT IN AERS: 2.0000 | INHALATION_SPRAY | Freq: Four times a day (QID) | RESPIRATORY_TRACT | 3 refills | Status: DC | PRN

## 2016-09-05 NOTE — Telephone Encounter (Signed)
Rx sent to [facility].

## 2016-09-05 NOTE — Telephone Encounter (Signed)
Pt need new Rx for PROAIR HFA     Pharm:  [facility]

## 2016-09-12 ENCOUNTER — Encounter: Payer: Self-pay | Admitting: Internal Medicine

## 2016-09-12 ENCOUNTER — Ambulatory Visit (INDEPENDENT_AMBULATORY_CARE_PROVIDER_SITE_OTHER): Payer: BC Managed Care – PPO | Admitting: Internal Medicine

## 2016-09-12 DIAGNOSIS — I10 Essential (primary) hypertension: Secondary | ICD-10-CM

## 2016-09-12 DIAGNOSIS — J441 Chronic obstructive pulmonary disease with (acute) exacerbation: Secondary | ICD-10-CM

## 2016-09-12 DIAGNOSIS — Z720 Tobacco use: Secondary | ICD-10-CM | POA: Diagnosis not present

## 2016-09-12 MED ORDER — METHYLPREDNISOLONE ACETATE 80 MG/ML IJ SUSP
80.0000 mg | Freq: Once | INTRAMUSCULAR | Status: AC
  Administered 2016-09-12: 80 mg via INTRAMUSCULAR

## 2016-09-12 MED ORDER — CEFUROXIME AXETIL 500 MG PO TABS: 500.0000 mg | ORAL_TABLET | Freq: Two times a day (BID) | ORAL | 0 refills | Status: DC

## 2016-09-12 MED ORDER — TIOTROPIUM BROMIDE MONOHYDRATE 18 MCG IN CAPS: 18.0000 ug | ORAL_CAPSULE | Freq: Every day | RESPIRATORY_TRACT | 12 refills | Status: DC

## 2016-09-12 NOTE — Progress Notes (Signed)
Pre visit review using our clinic review tool, if applicable. No additional management support is needed unless otherwise documented below in the visit note. 

## 2016-09-12 NOTE — Patient Instructions (Addendum)
Take your antibiotic as prescribed until ALL of it is gone, but stop if you develop a rash, swelling, or any side effects of the medication.  Contact our office as soon as possible if  there are side effects of the medication.  Smoking tobacco is very bad for your health. You should stop smoking immediately. Chronic Obstructive Pulmonary Disease Exacerbation Chronic obstructive pulmonary disease (COPD) is a common lung problem. In COPD, the flow of air from the lungs is limited. COPD exacerbations are times that breathing gets worse and you need extra treatment. Without treatment they can be life threatening. If they happen often, your lungs can become more damaged. If your COPD gets worse, your doctor may treat you with:  Medicines.  Oxygen.  Different ways to clear your airway, such as using a mask. HOME CARE  Do not smoke.  Avoid tobacco smoke and other things that bother your lungs.  If given, take your antibiotic medicine as told. Finish the medicine even if you start to feel better.  Only take medicines as told by your doctor.  Drink enough fluids to keep your pee (urine) clear or pale yellow (unless your doctor has told you not to).  Use a cool mist machine (vaporizer).  If you use oxygen or a machine that turns liquid medicine into a mist (nebulizer), continue to use them as told.  Keep up with shots (vaccinations) as told by your doctor.  Exercise regularly.  Eat healthy foods.  Keep all doctor visits as told. GET HELP RIGHT AWAY IF:  You are very short of breath and it gets worse.  You have trouble talking.  You have bad chest pain.  You have blood in your spit (sputum).  You have a fever.  You keep throwing up (vomiting).  You feel weak, or you pass out (faint).  You feel confused.  You keep getting worse. MAKE SURE YOU:  Understand these instructions.  Will watch your condition.  Will get help right away if you are not doing well or get worse.    This information is not intended to replace advice given to you by your health care provider. Make sure you discuss any questions you have with your health care provider.   Document Released: 11/30/2011 Document Revised: 01/01/2015 Document Reviewed: 08/15/2013 Elsevier Interactive Patient Education [employer].

## 2016-09-12 NOTE — Addendum Note (Signed)
Addended by: Marian Sorrow on: 09/12/2016 10:55 AM   Modules accepted: Orders

## 2016-09-12 NOTE — Progress Notes (Signed)
Subjective:    Patient ID: Trevor Mack, male    DOB: June 22, 1956, 60 y.o.   MRN: [id number]  HPI  60 year old patient who has a history of COPD.  Unfortunately, he continues to smoke and is back up to 1.5 packs per day.  He has been ill for the past week with increasing head and chest congestion.  He has developed worsening chest congestion with intermittent wheezing.  He has developed productive cough yielding yellow sputum.  He has had some associated headaches.  Denies any fever or chills. He has been using albuterol sporadically but no longer is on maintenance bronchodilators.  He has been on Spiriva in the past, but stop due to cost considerations  Past Medical History:  Diagnosis Date  . ANXIETY 07/23/2008  . COLONIC POLYPS, HX OF 03/26/2006   MULTIPLE FRAGMENTS OF TUBULAR ADENOMAS POLYPS  . Family history of colon cancer    Father   . GERD 07/23/2008  . HYPERLIPIDEMIA 07/23/2008  . HYPERTENSION 07/23/2008  . TENDINITIS, LEFT THUMB 07/15/2010     Social History   Social History  . Marital status: Married    Spouse name: N/A  . Number of children: 0  . Years of education: N/A   Occupational History  .  [place] History Main Topics  . Smoking status: Current Every Day Smoker    Types: Cigarettes  . Smokeless tobacco: Never Used     Comment: Started smoking at age 24.  Currently smoking 1 1/2 ppd.  . Alcohol use No  . Drug use: No  . Sexual activity: Not on file   Other Topics Concern  . Not on file   Social History Narrative  . No narrative on file    Past Surgical History:  Procedure Laterality Date  . LUMBAR LAMINECTOMY      Family History  Problem Relation Age of Onset  . Colon cancer Father   . Hypertension Father   . Diabetes Father   . Heart attack Father     No Known Allergies  Current Outpatient Prescriptions on File Prior to Visit  Medication Sig Dispense Refill  . albuterol (PROAIR HFA) 108 (90 Base) MCG/ACT inhaler  Inhale 2 puffs into the lungs every 6 (six) hours as needed for wheezing or shortness of breath. 3 Inhaler 3  . citalopram (CELEXA) 40 MG tablet TAKE 1 TABLET DAILY (NEEDS PHYSICAL) 90 tablet 0  . diphenoxylate-atropine (LOMOTIL) 2.5-0.025 MG per tablet Take 1 tablet by mouth 4 (four) times daily as needed for diarrhea or loose stools. 30 tablet 0  . ibuprofen (ADVIL) 200 MG tablet Take 400 mg by mouth every 6 (six) hours as needed.    Marland Kitchen LORazepam (ATIVAN) 0.5 MG tablet Take 1 tablet (0.5 mg total) by mouth 2 (two) times daily as needed. 180 tablet 1  . losartan (COZAAR) 100 MG tablet TAKE 1 TABLET DAILY 90 tablet 0  . omeprazole (PRILOSEC) 40 MG capsule Take 1 capsule (40 mg total) by mouth daily. 90 capsule 3  . ondansetron (ZOFRAN) 4 MG tablet Take 1 tablet (4 mg total) by mouth every 8 (eight) hours as needed for nausea or vomiting. 20 tablet 0  . pravastatin (PRAVACHOL) 40 MG tablet TAKE 1 TABLET DAILY 90 tablet 0  . sucralfate (CARAFATE) 1 g tablet Take 1 tablet (1 g total) by mouth 4 (four) times daily -  with meals and at bedtime. 30 tablet 0   No current facility-administered medications on  file prior to visit.     BP 130/80 (BP Location: Left Arm, Patient Position: Sitting, Cuff Size: Normal)   Pulse 85   Temp 98.5 F (36.9 C) (Oral)   Resp 20   Ht 5\' 9"  (1.753 m)   Wt 210 lb 8 oz (95.5 kg)   SpO2 98%   BMI 31.09 kg/m     Review of Systems  Constitutional: Positive for activity change, appetite change and fatigue. Negative for chills and fever.  HENT: Positive for congestion. Negative for dental problem, ear pain, hearing loss, sore throat, tinnitus, trouble swallowing and voice change.   Eyes: Negative for pain, discharge and visual disturbance.  Respiratory: Positive for cough, shortness of breath and wheezing. Negative for chest tightness and stridor.   Cardiovascular: Negative for chest pain, palpitations and leg swelling.  Gastrointestinal: Negative for abdominal  distention, abdominal pain, blood in stool, constipation, diarrhea, nausea and vomiting.  Genitourinary: Negative for difficulty urinating, discharge, flank pain, genital sores, hematuria and urgency.  Musculoskeletal: Negative for arthralgias, back pain, gait problem, joint swelling, myalgias and neck stiffness.  Skin: Negative for rash.  Neurological: Negative for dizziness, syncope, speech difficulty, weakness, numbness and headaches.  Hematological: Negative for adenopathy. Does not bruise/bleed easily.  Psychiatric/Behavioral: Negative for behavioral problems and dysphoric mood. The patient is not nervous/anxious.        Objective:   Physical Exam  Constitutional: He is oriented to person, place, and time. He appears well-developed and well-nourished. No distress.  Afebrile Blood pressure 130/80  HENT:  Head: Normocephalic.  Right Ear: External ear normal.  Left Ear: External ear normal.  Eyes: Conjunctivae and EOM are normal.  Neck: Normal range of motion.  Cardiovascular: Normal rate and normal heart sounds.   Pulmonary/Chest: Effort normal. No respiratory distress. He has no wheezes. He has no rales.  Scattered coarse rhonchi more prominent on the left.  No active wheezing.  No increased work of breathing.    Abdominal: Bowel sounds are normal.  Musculoskeletal: Normal range of motion. He exhibits no edema or tenderness.  Neurological: He is alert and oriented to person, place, and time.  Psychiatric: He has a normal mood and affect. His behavior is normal.          Assessment & Plan:   Exacerbation COPD.  Will treat with Depo-Medrol 80 mg IM.  Resume Spiriva.  We'll continue when necessary albuterol.  Will treat with Ceftin 500 twice a day Total smoking cessation encouraged Essential hypertension, stable  Note for work, dispensed Follow-up 6 months or as needed  [employer]

## 2016-11-07 ENCOUNTER — Other Ambulatory Visit: Payer: Self-pay | Admitting: *Deleted

## 2016-11-07 MED ORDER — LORAZEPAM 0.5 MG PO TABS: 0.5000 mg | ORAL_TABLET | Freq: Two times a day (BID) | ORAL | 1 refills | Status: DC | PRN

## 2016-12-06 ENCOUNTER — Other Ambulatory Visit: Payer: Self-pay | Admitting: Internal Medicine

## 2017-02-05 ENCOUNTER — Ambulatory Visit (INDEPENDENT_AMBULATORY_CARE_PROVIDER_SITE_OTHER): Payer: BC Managed Care – PPO | Admitting: Internal Medicine

## 2017-02-05 ENCOUNTER — Encounter: Payer: Self-pay | Admitting: Internal Medicine

## 2017-02-05 VITALS — BP 152/80 | HR 94 | Temp 99.3°F | Ht 69.0 in | Wt 208.6 lb

## 2017-02-05 DIAGNOSIS — Z720 Tobacco use: Secondary | ICD-10-CM | POA: Diagnosis not present

## 2017-02-05 DIAGNOSIS — J441 Chronic obstructive pulmonary disease with (acute) exacerbation: Secondary | ICD-10-CM

## 2017-02-05 DIAGNOSIS — I10 Essential (primary) hypertension: Secondary | ICD-10-CM | POA: Diagnosis not present

## 2017-02-05 MED ORDER — CEFUROXIME AXETIL 500 MG PO TABS: 500.0000 mg | ORAL_TABLET | Freq: Two times a day (BID) | ORAL | 0 refills | Status: DC

## 2017-02-05 MED ORDER — PREDNISONE 20 MG PO TABS: 20.0000 mg | ORAL_TABLET | Freq: Two times a day (BID) | ORAL | 0 refills | Status: DC

## 2017-02-05 MED ORDER — BUDESONIDE-FORMOTEROL FUMARATE 160-4.5 MCG/ACT IN AERO: 2.0000 | INHALATION_SPRAY | Freq: Two times a day (BID) | RESPIRATORY_TRACT | 12 refills | Status: DC

## 2017-02-05 NOTE — Progress Notes (Signed)
Subjective:    Patient ID: Trevor Mack, male    DOB: [birth year], 61 y.o.   MRN: [id number]  HPI  61 year old patient who has a history of COPD Gold stage C.  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  Heavy lifting and climbing ladders is required and he has considerable dyspnea on exertion.  He feels that he is no longer able to perform his present job to full capacity. He states that he is considering applying for disability. He continues to smoke 1 half packs per day.  He complains of productive cough at times discolored. He has been on Spiriva as well as rescue albuterol  Past Medical History:  Diagnosis Date  . ANXIETY 07/23/2008  . COLONIC POLYPS, HX OF 03/26/2006   MULTIPLE FRAGMENTS OF TUBULAR ADENOMAS POLYPS  . Family history of colon cancer    Father   . GERD 07/23/2008  . HYPERLIPIDEMIA 07/23/2008  . HYPERTENSION 07/23/2008  . TENDINITIS, LEFT THUMB 07/15/2010     Social History   Social History  . Marital status: Married    Spouse name: N/A  . Number of children: 0  . Years of education: N/A   Occupational History  .  [place] History Main Topics  . Smoking status: Current Every Day Smoker    Types: Cigarettes  . Smokeless tobacco: Never Used     Comment: Started smoking at age 36.  Currently smoking 1 1/2 ppd.  . Alcohol use No  . Drug use: No  . Sexual activity: Not on file   Other Topics Concern  . Not on file   Social History Narrative  . No narrative on file    Past Surgical History:  Procedure Laterality Date  . LUMBAR LAMINECTOMY      Family History  Problem Relation Age of Onset  . Colon cancer Father   . Hypertension Father   . Diabetes Father   . Heart attack Father     No Known Allergies  Current Outpatient Prescriptions on File Prior to Visit  Medication Sig Dispense Refill  . albuterol (PROAIR HFA) 108 (90 Base) MCG/ACT inhaler Inhale 2 puffs into the lungs every  6 (six) hours as needed for wheezing or shortness of breath. 3 Inhaler 3  . citalopram (CELEXA) 40 MG tablet TAKE 1 TABLET DAILY (NEEDS PHYSICAL) 90 tablet 1  . diphenoxylate-atropine (LOMOTIL) 2.5-0.025 MG per tablet Take 1 tablet by mouth 4 (four) times daily as needed for diarrhea or loose stools. 30 tablet 0  . ibuprofen (ADVIL) 200 MG tablet Take 400 mg by mouth every 6 (six) hours as needed.    Marland Kitchen LORazepam (ATIVAN) 0.5 MG tablet Take 1 tablet (0.5 mg total) by mouth 2 (two) times daily as needed. 180 tablet 1  . losartan (COZAAR) 100 MG tablet TAKE 1 TABLET DAILY. NEEDS PHYSICAL 90 tablet 1  . omeprazole (PRILOSEC) 40 MG capsule Take 1 capsule (40 mg total) by mouth daily. 90 capsule 3  . ondansetron (ZOFRAN) 4 MG tablet Take 1 tablet (4 mg total) by mouth every 8 (eight) hours as needed for nausea or vomiting. 20 tablet 0  . pravastatin (PRAVACHOL) 40 MG tablet TAKE 1 TABLET DAILY 90 tablet 0  . tiotropium (SPIRIVA) 18 MCG inhalation capsule Place 1 capsule (18 mcg total) into inhaler and inhale daily. 30 capsule 12   No current facility-administered medications on file prior to visit.  BP (!) 152/80 (BP Location: Left Arm, Patient Position: Sitting, Cuff Size: Normal)   Pulse 94   Temp 99.3 F (37.4 C) (Oral)   Ht 5\' 9"  (1.753 m)   Wt 208 lb 9.6 oz (94.6 kg)   SpO2 96%   BMI 30.80 kg/m     Review of Systems  Constitutional: Positive for fatigue. Negative for appetite change, chills and fever.  HENT: Negative for congestion, dental problem, ear pain, hearing loss, sore throat, tinnitus, trouble swallowing and voice change.   Eyes: Negative for pain, discharge and visual disturbance.  Respiratory: Positive for cough, choking, shortness of breath and wheezing. Negative for chest tightness and stridor.   Cardiovascular: Negative for chest pain, palpitations and leg swelling.  Gastrointestinal: Negative for abdominal distention, abdominal pain, blood in stool, constipation,  diarrhea, nausea and vomiting.  Genitourinary: Negative for difficulty urinating, discharge, flank pain, genital sores, hematuria and urgency.  Musculoskeletal: Negative for arthralgias, back pain, gait problem, joint swelling, myalgias and neck stiffness.  Skin: Negative for rash.  Neurological: Negative for dizziness, syncope, speech difficulty, weakness, numbness and headaches.  Hematological: Negative for adenopathy. Does not bruise/bleed easily.  Psychiatric/Behavioral: Negative for behavioral problems and dysphoric mood. The patient is not nervous/anxious.        Objective:   Physical Exam  Constitutional: He is oriented to person, place, and time. He appears well-developed.  HENT:  Head: Normocephalic.  Right Ear: External ear normal.  Left Ear: External ear normal.  Eyes: Conjunctivae and EOM are normal.  Neck: Normal range of motion.  Cardiovascular: Normal rate and normal heart sounds.   Pulmonary/Chest: Effort normal. No respiratory distress. He has no wheezes.  Coarse diffuse rhonchi O2 saturation 96  Abdominal: Bowel sounds are normal.  Musculoskeletal: Normal range of motion. He exhibits no edema or tenderness.  Neurological: He is alert and oriented to person, place, and time.  Psychiatric: He has a normal mood and affect. His behavior is normal.          Assessment & Plan:   COPD exacerbation.  We'll continue present regimen.  Will treat with 6 days of oral prednisone.  Will treat with antibiotic therapy. COPD  Gold stage C.  Will add Symbicort. Essential hypertension, stable Ongoing tobacco use.  Total smoking cessation encouraged  Nyoka Cowden

## 2017-02-05 NOTE — Progress Notes (Signed)
Pre visit review using our clinic review tool, if applicable. No additional management support is needed unless otherwise documented below in the visit note. 

## 2017-02-05 NOTE — Patient Instructions (Addendum)
Chronic Obstructive Pulmonary Disease Exacerbation Chronic obstructive pulmonary disease (COPD) is a common lung condition in which airflow from the lungs is limited. COPD is a general term that can be used to describe many different lung problems that limit airflow, including chronic bronchitis and emphysema. COPD exacerbations are episodes when breathing symptoms become much worse and require extra treatment. Without treatment, COPD exacerbations can be life threatening, and frequent COPD exacerbations can cause further damage to your lungs. What are the causes?  Respiratory infections.  Exposure to smoke.  Exposure to air pollution, chemical fumes, or dust. Sometimes there is no apparent cause or trigger. What increases the risk?  Smoking cigarettes.  Older age.  Frequent prior COPD exacerbations. What are the signs or symptoms?  Increased coughing.  Increased thick spit (sputum) production.  Increased wheezing.  Increased shortness of breath.  Rapid breathing.  Chest tightness. How is this diagnosed? Your medical history, a physical exam, and tests will help your health care provider make a diagnosis. Tests may include:  A chest X-ray.  Basic lab tests.  Sputum testing.  An arterial blood gas test.  How is this treated? Depending on the severity of your COPD exacerbation, you may need to be admitted to a hospital for treatment. Some of the treatments commonly used to treat COPD exacerbations are:  Antibiotic medicines.  Bronchodilators. These are drugs that expand the air passages. They may be given with an inhaler or nebulizer. Spacer devices may be needed to help improve drug delivery.  Corticosteroid medicines.  Supplemental oxygen therapy.  Airway clearing techniques, such as noninvasive ventilation (NIV) and positive expiratory pressure (PEP). These provide respiratory support through a mask or other noninvasive device.  Follow these instructions at  home:  Do not smoke. Quitting smoking is very important to prevent COPD from getting worse and exacerbations from happening as often.  Avoid exposure to all substances that irritate the airway, especially to tobacco smoke.  If you were prescribed an antibiotic medicine, finish it all even if you start to feel better.  Take all medicines as directed by your health care provider.It is important to use correct technique with inhaled medicines.  Drink enough fluids to keep your urine clear or pale yellow (unless you have a medical condition that requires fluid restriction).  Use a cool mist vaporizer. This makes it easier to clear your chest when you cough.  If you have a home nebulizer and oxygen, continue to use them as directed.  Maintain all necessary vaccinations to prevent infections.  Exercise regularly.  Eat a healthy diet.  Keep all follow-up appointments as directed by your health care provider. Get help right away if:  You have worsening shortness of breath.  You have trouble talking.  You have severe chest pain.  You have blood in your sputum.  You have a fever.  You have weakness, vomit repeatedly, or faint.  You feel confused.  You continue to get worse. This information is not intended to replace advice given to you by your health care provider. Make sure you discuss any questions you have with your health care provider. Document Released: 10/08/2007 Document Revised: 05/18/2016 Document Reviewed: 08/15/2013 Elsevier Interactive Patient Education  2017 [employer] Inc[employer]  

## 2017-02-07 ENCOUNTER — Telehealth: Payer: Self-pay | Admitting: Internal Medicine

## 2017-02-07 ENCOUNTER — Encounter: Payer: Self-pay | Admitting: Internal Medicine

## 2017-02-07 NOTE — Telephone Encounter (Signed)
See message below, please advise.

## 2017-02-07 NOTE — Telephone Encounter (Addendum)
Pt now needs a letter stating he has no restrictions when he returns to work tomorrow, 2/15.  Pt would like you to fax to his employer today Fax: [phone] . Thank you

## 2017-02-08 ENCOUNTER — Encounter: Payer: Self-pay | Admitting: Internal Medicine

## 2017-02-08 NOTE — Telephone Encounter (Signed)
Pt states he told me incorrectly.  Pt needs the letter to states he cannot go back to work and do the things he needs to do. Pt DOES need restrictions.  Pt states Dr Raliegh Ip is familiar with his issues., and Dr Raliegh Ip advised him with his current health issues, he does not need to be doing his particular.  Pt needs the letter to state please disregard the first letter. Thanks.  Same fax number.

## 2017-02-08 NOTE — Telephone Encounter (Signed)
Please see message below, please advise 

## 2017-02-08 NOTE — Telephone Encounter (Signed)
Notify patient letter is ready for pickup

## 2017-02-09 NOTE — Telephone Encounter (Signed)
Spoke with pt and he wanted letter faxed to his employer. Letter was faxed and pt was made aware.

## 2017-02-22 ENCOUNTER — Telehealth: Payer: Self-pay | Admitting: Internal Medicine

## 2017-02-22 NOTE — Telephone Encounter (Signed)
Noted,  Thank you!

## 2017-02-22 NOTE — Telephone Encounter (Signed)
See message below, please advise. I do not have this paperwork

## 2017-02-22 NOTE — Telephone Encounter (Signed)
° ° ° ° °  Pt call to say disability paperwork should be faxed to [phone].  He said he dropped the paperwork off last week and would like a call back when paper work is completed

## 2017-02-22 NOTE — Telephone Encounter (Signed)
I believe all this paperwork was completed prior to my vacation

## 2017-02-26 NOTE — Telephone Encounter (Signed)
Pt calling to check the status of the 2nd set of paperwork that was brought in on Friday.

## 2017-02-27 NOTE — Telephone Encounter (Signed)
All paperwork completed

## 2017-03-13 ENCOUNTER — Other Ambulatory Visit: Payer: Self-pay | Admitting: Internal Medicine

## 2017-03-13 ENCOUNTER — Telehealth: Payer: Self-pay | Admitting: Internal Medicine

## 2017-03-13 MED ORDER — BUDESONIDE-FORMOTEROL FUMARATE 160-4.5 MCG/ACT IN AERO: 2.0000 | INHALATION_SPRAY | Freq: Two times a day (BID) | RESPIRATORY_TRACT | 12 refills | Status: DC

## 2017-03-13 NOTE — Telephone Encounter (Signed)
Returned David's call from [employer], Shanon Brow is currently out of the office and he will give me a call back with in 24 hours.

## 2017-03-13 NOTE — Telephone Encounter (Signed)
Shanon Brow w/[employer] [place] would like to have the following treatment plan,  office plan, return to work date and would like to know if pt has been referred to a pulmonologist.

## 2017-03-26 ENCOUNTER — Telehealth: Payer: Self-pay | Admitting: Internal Medicine

## 2017-03-26 NOTE — Telephone Encounter (Signed)
Please advise 

## 2017-03-26 NOTE — Telephone Encounter (Signed)
Pt would like to have a call to see in reference to the a referral that he does not know anything about that the insurance company (disability) state that he has and to see if he needs to have another a appointment to come back in.

## 2017-03-26 NOTE — Telephone Encounter (Signed)
Please call patient and get additional details

## 2017-03-27 ENCOUNTER — Telehealth: Payer: Self-pay | Admitting: Internal Medicine

## 2017-03-27 NOTE — Telephone Encounter (Signed)
Agree, please set up for pulmonary consultation

## 2017-03-27 NOTE — Telephone Encounter (Signed)
Spoke with Roselyn Reef from the disability insurance, and they have requested a copy of pt's medical records to support his disability clam.  We do not need to order any kind of pulmonary studies. Pt called and informed

## 2017-03-27 NOTE — Telephone Encounter (Signed)
Noted.  Okay to send records

## 2017-03-27 NOTE — Telephone Encounter (Addendum)
Pt would like a referral to pulmonologist for copd

## 2017-03-27 NOTE — Telephone Encounter (Signed)
See message below - please advise

## 2017-03-27 NOTE — Telephone Encounter (Signed)
Pt stated that the insurance company wants Dr Raliegh Ip to order some "lung test". Pt was unable to state which studies are needed. Will call Ewing Schlein at [phone] to get more information.

## 2017-03-28 ENCOUNTER — Other Ambulatory Visit: Payer: Self-pay | Admitting: Internal Medicine

## 2017-03-28 DIAGNOSIS — J449 Chronic obstructive pulmonary disease, unspecified: Secondary | ICD-10-CM

## 2017-03-28 NOTE — Telephone Encounter (Signed)
Pulmonary consult order placed in epic, pt called and made aware.

## 2017-03-29 NOTE — Telephone Encounter (Signed)
Paty please contact the disability insurance people and let them know we need medical release authorization before we can release records

## 2017-03-29 NOTE — Telephone Encounter (Signed)
Called and spoke with Debb and she will have her office fax over a medical record release authorization form

## 2017-04-04 ENCOUNTER — Encounter: Payer: Self-pay | Admitting: Internal Medicine

## 2017-04-04 ENCOUNTER — Ambulatory Visit (INDEPENDENT_AMBULATORY_CARE_PROVIDER_SITE_OTHER): Payer: BC Managed Care – PPO | Admitting: Internal Medicine

## 2017-04-04 VITALS — BP 160/88 | HR 110 | Temp 98.5°F | Ht 69.0 in | Wt 210.4 lb

## 2017-04-04 DIAGNOSIS — Z720 Tobacco use: Secondary | ICD-10-CM | POA: Diagnosis not present

## 2017-04-04 DIAGNOSIS — I10 Essential (primary) hypertension: Secondary | ICD-10-CM | POA: Diagnosis not present

## 2017-04-04 DIAGNOSIS — J441 Chronic obstructive pulmonary disease with (acute) exacerbation: Secondary | ICD-10-CM

## 2017-04-04 NOTE — Progress Notes (Signed)
Pre visit review using our clinic review tool, if applicable. No additional management support is needed unless otherwise documented below in the visit note. 

## 2017-04-04 NOTE — Progress Notes (Signed)
Subjective:    Patient ID: Trevor Mack, male    DOB: November 23, 1956, 61 y.o.   MRN: [id number]  HPI  61 year old patient who is seen today for follow-up of COPD.  Maintenance medications include Spiriva and Symbicort as well as rescue albuterol. He is applying for disability and is having some issues with the insurance companies.  He is scheduled for pulmonary follow-up next month. He continues to have considerable cough and shortness of breath. Previously worked as an Trevor Mack that was physically quite demanding.  Apparently his job requires full-time employment without restrictions or he is considered disabled. He continues to cut back on his smoking consumption but still is about one pack per day  Past Medical History:  Diagnosis Date  . ANXIETY 07/23/2008  . COLONIC POLYPS, HX OF 03/26/2006   MULTIPLE FRAGMENTS OF TUBULAR ADENOMAS POLYPS  . Family history of colon cancer    Father   . GERD 07/23/2008  . HYPERLIPIDEMIA 07/23/2008  . HYPERTENSION 07/23/2008  . TENDINITIS, LEFT THUMB 07/15/2010     Social History   Social History  . Marital status: Married    Spouse name: N/A  . Number of children: 0  . Years of education: N/A   Occupational History  .  [place] History Main Topics  . Smoking status: Current Every Day Smoker    Types: Cigarettes  . Smokeless tobacco: Never Used     Comment: Started smoking at age 61.  Currently smoking 1 1/2 ppd.  . Alcohol use No  . Drug use: No  . Sexual activity: Not on file   Other Topics Concern  . Not on file   Social History Narrative  . No narrative on file    Past Surgical History:  Procedure Laterality Date  . LUMBAR LAMINECTOMY      Family History  Problem Relation Age of Onset  . Colon cancer Father   . Hypertension Father   . Diabetes Father   . Heart attack Father     No Known Allergies  Current Outpatient Prescriptions on File Prior to Visit  Medication Sig Dispense Refill  .  albuterol (PROAIR HFA) 108 (90 Base) MCG/ACT inhaler Inhale 2 puffs into the lungs every 6 (six) hours as needed for wheezing or shortness of breath. 3 Inhaler 3  . budesonide-formoterol (SYMBICORT) 160-4.5 MCG/ACT inhaler Inhale 2 puffs into the lungs 2 (two) times daily. 1 Inhaler 12  . citalopram (CELEXA) 40 MG tablet TAKE 1 TABLET DAILY (NEEDS PHYSICAL) 90 tablet 1  . diphenoxylate-atropine (LOMOTIL) 2.5-0.025 MG per tablet Take 1 tablet by mouth 4 (four) times daily as needed for diarrhea or loose stools. 30 tablet 0  . ibuprofen (ADVIL) 200 MG tablet Take 400 mg by mouth every 6 (six) hours as needed.    Marland Kitchen LORazepam (ATIVAN) 0.5 MG tablet Take 1 tablet (0.5 mg total) by mouth 2 (two) times daily as needed. 180 tablet 1  . losartan (COZAAR) 100 MG tablet TAKE 1 TABLET DAILY. NEEDS PHYSICAL 90 tablet 1  . omeprazole (PRILOSEC) 40 MG capsule Take 1 capsule (40 mg total) by mouth daily. 90 capsule 3  . ondansetron (ZOFRAN) 4 MG tablet Take 1 tablet (4 mg total) by mouth every 8 (eight) hours as needed for nausea or vomiting. 20 tablet 0  . pravastatin (PRAVACHOL) 40 MG tablet TAKE 1 TABLET DAILY 90 tablet 0  . tiotropium (SPIRIVA) 18 MCG inhalation capsule Place 1 capsule (18 mcg total) into inhaler  and inhale daily. 30 capsule 12   No current facility-administered medications on file prior to visit.     BP (!) 160/88 (BP Location: Left Arm, Patient Position: Sitting, Cuff Size: Normal)   Pulse (!) 110   Temp 98.5 F (36.9 C) (Oral)   Ht 5\' 9"  (1.753 m)   Wt 210 lb 6.4 oz (95.4 kg)   SpO2 97%   BMI 31.07 kg/m     Review of Systems  Constitutional: Positive for activity change and fatigue. Negative for appetite change, chills and fever.  HENT: Negative for congestion, dental problem, ear pain, hearing loss, sore throat, tinnitus, trouble swallowing and voice change.   Eyes: Negative for pain, discharge and visual disturbance.  Respiratory: Positive for cough and shortness of breath.  Negative for chest tightness, wheezing and stridor.   Cardiovascular: Negative for chest pain, palpitations and leg swelling.  Gastrointestinal: Negative for abdominal distention, abdominal pain, blood in stool, constipation, diarrhea, nausea and vomiting.  Genitourinary: Negative for difficulty urinating, discharge, flank pain, genital sores, hematuria and urgency.  Musculoskeletal: Negative for arthralgias, back pain, gait problem, joint swelling, myalgias and neck stiffness.  Skin: Negative for rash.  Neurological: Negative for dizziness, syncope, speech difficulty, weakness, numbness and headaches.  Hematological: Negative for adenopathy. Does not bruise/bleed easily.  Psychiatric/Behavioral: Negative for behavioral problems and dysphoric mood. The patient is not nervous/anxious.        Objective:   Physical Exam  Constitutional: He is oriented to person, place, and time. He appears well-developed.  Anxious [name] but frequent paroxysms of coughing O2 saturation 97 Pulse after ambulation to examining room 110 Initial blood pressure 160/88 Follow-up blood pressure 140/82  HENT:  Head: Normocephalic.  Right Ear: External ear normal.  Left Ear: External ear normal.  Eyes: Conjunctivae and EOM are normal.  Neck: Normal range of motion.  Cardiovascular: Normal rate and normal heart sounds.   Pulmonary/Chest: Effort normal. No respiratory distress. He has wheezes.  Scattered coarse rhonchi left lung greater than right Scattered faint expiratory wheezing  Abdominal: Bowel sounds are normal.  Musculoskeletal: Normal range of motion. He exhibits no edema or tenderness.  Neurological: He is alert and oriented to person, place, and time.  Psychiatric: He has a normal mood and affect. His behavior is normal.          Assessment & Plan:   COPD- Gold stage C.  Remains symptomatic on present medical therapy Status post recent exacerbation Ongoing tobacco use.  Total smoking  cessation again encouraged  Patient has been treated with Chantix in the past, which caused nightmares  Check PFTs Pulmonary consult as scheduled  Nyoka Cowden

## 2017-04-04 NOTE — Patient Instructions (Signed)
Pulmonary function studies as discussed  Pulmonary consultation as scheduled  Smoking tobacco is very bad for your health. You should stop smoking immediately.

## 2017-04-11 ENCOUNTER — Ambulatory Visit (INDEPENDENT_AMBULATORY_CARE_PROVIDER_SITE_OTHER): Payer: BC Managed Care – PPO | Admitting: Pulmonary Disease

## 2017-04-11 ENCOUNTER — Other Ambulatory Visit (INDEPENDENT_AMBULATORY_CARE_PROVIDER_SITE_OTHER): Payer: BC Managed Care – PPO

## 2017-04-11 ENCOUNTER — Ambulatory Visit (INDEPENDENT_AMBULATORY_CARE_PROVIDER_SITE_OTHER)
Admission: RE | Admit: 2017-04-11 | Discharge: 2017-04-11 | Disposition: A | Discharge status: Home / Self Care | Payer: BC Managed Care – PPO | Source: Ambulatory Visit | Attending: Pulmonary Disease | Admitting: Pulmonary Disease

## 2017-04-11 ENCOUNTER — Encounter: Payer: Self-pay | Admitting: Pulmonary Disease

## 2017-04-11 VITALS — BP 154/82 | HR 80 | Ht 69.0 in | Wt 210.0 lb

## 2017-04-11 DIAGNOSIS — R0600 Dyspnea, unspecified: Secondary | ICD-10-CM

## 2017-04-11 LAB — CBC WITH DIFFERENTIAL/PLATELET
BASOS ABS: 0.1 10*3/uL (ref 0.0–0.1)
Basophils Relative: 1 % (ref 0.0–3.0)
EOS ABS: 0.2 10*3/uL (ref 0.0–0.7)
Eosinophils Relative: 1.8 % (ref 0.0–5.0)
HCT: 48.3 % (ref 39.0–52.0)
Hemoglobin: 16.1 g/dL (ref 13.0–17.0)
LYMPHS ABS: 2.5 10*3/uL (ref 0.7–4.0)
Lymphocytes Relative: 22.3 % (ref 12.0–46.0)
MCHC: 33.4 g/dL (ref 30.0–36.0)
MCV: 84.3 fl (ref 78.0–100.0)
MONO ABS: 0.7 10*3/uL (ref 0.1–1.0)
Monocytes Relative: 5.9 % (ref 3.0–12.0)
NEUTROS PCT: 69 % (ref 43.0–77.0)
Neutro Abs: 7.8 10*3/uL — ABNORMAL HIGH (ref 1.4–7.7)
Platelets: 183 10*3/uL (ref 150.0–400.0)
RBC: 5.73 Mil/uL (ref 4.22–5.81)
RDW: 14.6 % (ref 11.5–15.5)
WBC: 11.2 10*3/uL — AB (ref 4.0–10.5)

## 2017-04-11 MED ORDER — FLUTICASONE-UMECLIDIN-VILANT 100-62.5-25 MCG/INH IN AEPB: 1.0000 | INHALATION_SPRAY | Freq: Every day | RESPIRATORY_TRACT | 0 refills | Status: DC

## 2017-04-11 NOTE — Patient Instructions (Addendum)
Try taking Trelegy 1 puff daily no matter how you feel instead of Symbicort and Spiriva.  We will give you a sample, call us if you think it is helpful. We will call you with the results of the blood test and the chest x-ray Follow-up with our nurse practitioner in 4 weeks to see how you're doing with the new medicine. Quit smoking

## 2017-04-11 NOTE — Progress Notes (Signed)
Subjective:    Patient ID: Trevor Mack, male    DOB: Jun 14, 1956, 61 y.o.   MRN: [id number]  HPI  Chief Complaint  Patient presents with  . Advice Only    Referred back to clinic by Dr. Burnice Logan for COPD.  former [facility] pt.  CAT score 30.  PFT scheduled 05/2017    Mr. Trevor Mack is here to see me today for further evaluation of dyspnea.   > it has been getting worse over the last few months to years > he says that climbing steps makes it worse, carrying weight (as much as 10-15 pounds) > no environmental triggers make it worse > when he is short of breath he feels wheezing in his chest  > wheezing is worse a night > some chest tightness, rare though  He had pneumonia 15 years ago.  He went to the ER and was sent home.  He says that he had bronchitis a month ago and he was treated with antibiotics and prednisone.  He takes symbicort and spiriva.  He has taken symbicort 2-3 months, the other one only a year.  He uses them every day.  He uses the albuterol 3-4 times a day. It helps when he helps he takes it for a while.   He doesn't have any heart problems.    He has some burning in his chest which he attributes to heart burn, worse with certain foods.  > he takes prilosec every day which helps some  He started smoking 35 years ago, smokes 1.5 ppd  He had some blood in his stool a year ago.    Past Medical History:  Diagnosis Date  . ANXIETY 07/23/2008  . COLONIC POLYPS, HX OF 03/26/2006   MULTIPLE FRAGMENTS OF TUBULAR ADENOMAS POLYPS  . Family history of colon cancer    Father   . GERD 07/23/2008  . HYPERLIPIDEMIA 07/23/2008  . HYPERTENSION 07/23/2008  . TENDINITIS, LEFT THUMB 07/15/2010     Family History  Problem Relation Age of Onset  . Colon cancer Father   . Hypertension Father   . Diabetes Father   . Heart attack Father      Social History   Social History  . Marital status: Married    Spouse name: N/A  . Number of children: 0  . Years of education: N/A    Occupational History  .  [place] History Main Topics  . Smoking status: Current Every Day Smoker    Packs/day: 2.50    Years: 35.00    Types: Cigarettes  . Smokeless tobacco: Never Used     Comment: Started smoking at age 83.  Currently smoking 1 1/2 ppd.  . Alcohol use No  . Drug use: No  . Sexual activity: Not on file   Other Topics Concern  . Not on file   Social History Narrative  . No narrative on file     No Known Allergies   Outpatient Medications Prior to Visit  Medication Sig Dispense Refill  . albuterol (PROAIR HFA) 108 (90 Base) MCG/ACT inhaler Inhale 2 puffs into the lungs every 6 (six) hours as needed for wheezing or shortness of breath. 3 Inhaler 3  . budesonide-formoterol (SYMBICORT) 160-4.5 MCG/ACT inhaler Inhale 2 puffs into the lungs 2 (two) times daily. 1 Inhaler 12  . citalopram (CELEXA) 40 MG tablet TAKE 1 TABLET DAILY (NEEDS PHYSICAL) 90 tablet 1  . diphenoxylate-atropine (LOMOTIL) 2.5-0.025 MG per tablet Take 1 tablet  by mouth 4 (four) times daily as needed for diarrhea or loose stools. 30 tablet 0  . ibuprofen (ADVIL) 200 MG tablet Take 400 mg by mouth every 6 (six) hours as needed.    Marland Kitchen LORazepam (ATIVAN) 0.5 MG tablet Take 1 tablet (0.5 mg total) by mouth 2 (two) times daily as needed. 180 tablet 1  . losartan (COZAAR) 100 MG tablet TAKE 1 TABLET DAILY. NEEDS PHYSICAL 90 tablet 1  . omeprazole (PRILOSEC) 40 MG capsule Take 1 capsule (40 mg total) by mouth daily. 90 capsule 3  . ondansetron (ZOFRAN) 4 MG tablet Take 1 tablet (4 mg total) by mouth every 8 (eight) hours as needed for nausea or vomiting. 20 tablet 0  . pravastatin (PRAVACHOL) 40 MG tablet TAKE 1 TABLET DAILY 90 tablet 0  . tiotropium (SPIRIVA) 18 MCG inhalation capsule Place 1 capsule (18 mcg total) into inhaler and inhale daily. 30 capsule 12   No facility-administered medications prior to visit.      Review of Systems  Constitutional: Negative for fever  and unexpected weight change.  HENT: Positive for congestion. Negative for dental problem, ear pain, nosebleeds, postnasal drip, rhinorrhea, sinus pressure, sneezing, sore throat and trouble swallowing.   Eyes: Negative for redness and itching.  Respiratory: Positive for cough, chest tightness, shortness of breath and wheezing.   Cardiovascular: Negative for palpitations and leg swelling.  Gastrointestinal: Negative for nausea and vomiting.  Genitourinary: Negative for dysuria.  Musculoskeletal: Negative for joint swelling.  Skin: Negative for rash.  Neurological: Negative for headaches.  Hematological: Does not bruise/bleed easily.  Psychiatric/Behavioral: Negative for dysphoric mood. The patient is not nervous/anxious.        Objective:   Physical Exam  Vitals:   04/11/17 1038  BP: (!) 154/82  Pulse: 80  SpO2: 96%  Weight: 210 lb (95.3 kg)  Height: 5\' 9"  (1.753 m)    Gen: chronically ill appearing, no acute distress HENT: NCAT, OP clear, neck supple without masses Eyes: PERRL, EOMi Lymph: no cervical lymphadenopathy PULM: Some rhonchi left more than right, OK air movement CV: RRR, no mgr, no JVD GI: BS+, soft, nontender, no hsm Derm: no rash or skin breakdown MSK: normal bulk and tone Neuro: A&Ox4, CN II-XII intact, strength 5/5 in all 4 extremities Psyche: normal mood and affect  Pulmonary function test: 2013 simple spirometry ratio 68%, FEV1 2.21 L 59% predicted, FVC 3.27 L 70% predicted   Labs: 2013 Alpha-1 value normal CBC    Component Value Date/Time   WBC 5.3 03/03/2016 0844   RBC 5.51 03/03/2016 0844   HGB 15.8 03/03/2016 0844   HCT 46.7 03/03/2016 0844   PLT 107.0 (L) 03/03/2016 0844   MCV 84.8 03/03/2016 0844   MCHC 33.8 03/03/2016 0844   RDW 15.5 03/03/2016 0844   LYMPHSABS 1.6 03/03/2016 0844   MONOABS 0.4 03/03/2016 0844   EOSABS 0.1 03/03/2016 0844   BASOSABS 0.0 03/03/2016 0844     Records from his visit in April 2018 with his primary  care physician reviewed. He was noted to have gold stage II COPD with ongoing tobacco use and continued to have symptoms. Pulmonary function testing was ordered.  2013 chest x-ray images independently reviewed showing an increased AP diameter with emphysema behind sternum       Assessment & Plan:  Chronic obstructive lung disease with acute on chronic bronchitis and exacerbation gold stage C In 2013 he was evaluated by our clinic and at that point he had gold grade C  COPD based on his symptoms and moderate airflow obstruction.  Since then he has continued to smoke cigarettes which undoubtedly has contributed to further decline in his lung function. I believe that his dyspnea is due to his COPD based on his symptoms.  He may do better with a combination inhaler which contains all 3 of the forms of controller bronchodilators and steroids that we use for COPD.  He needs to quit smoking right away.  The differential diagnosis of dyspnea is broad and includes cardiac disease as well as bleeding. He did have some GI bleeding over a year ago and as I can tell he has not had a hemoglobin value checked recently.  Plan: Trial of Trelegy, sample given Check CBC Check chest x-ray Simple spirometry today Ambulatory oximetry today If no improvement when follows up in 4 weeks send for cardiac evaluation    Tobacco use Counseled at length today to quit smoking. He would like to try the Nicotrol inhaler, will prescribe.  Given smoking cessation resources.    Current Outpatient Prescriptions:  .  albuterol (PROAIR HFA) 108 (90 Base) MCG/ACT inhaler, Inhale 2 puffs into the lungs every 6 (six) hours as needed for wheezing or shortness of breath., Disp: 3 Inhaler, Rfl: 3 .  budesonide-formoterol (SYMBICORT) 160-4.5 MCG/ACT inhaler, Inhale 2 puffs into the lungs 2 (two) times daily., Disp: 1 Inhaler, Rfl: 12 .  citalopram (CELEXA) 40 MG tablet, TAKE 1 TABLET DAILY (NEEDS PHYSICAL), Disp: 90 tablet,  Rfl: 1 .  diphenoxylate-atropine (LOMOTIL) 2.5-0.025 MG per tablet, Take 1 tablet by mouth 4 (four) times daily as needed for diarrhea or loose stools., Disp: 30 tablet, Rfl: 0 .  ibuprofen (ADVIL) 200 MG tablet, Take 400 mg by mouth every 6 (six) hours as needed., Disp: , Rfl:  .  LORazepam (ATIVAN) 0.5 MG tablet, Take 1 tablet (0.5 mg total) by mouth 2 (two) times daily as needed., Disp: 180 tablet, Rfl: 1 .  losartan (COZAAR) 100 MG tablet, TAKE 1 TABLET DAILY. NEEDS PHYSICAL, Disp: 90 tablet, Rfl: 1 .  omeprazole (PRILOSEC) 40 MG capsule, Take 1 capsule (40 mg total) by mouth daily., Disp: 90 capsule, Rfl: 3 .  ondansetron (ZOFRAN) 4 MG tablet, Take 1 tablet (4 mg total) by mouth every 8 (eight) hours as needed for nausea or vomiting., Disp: 20 tablet, Rfl: 0 .  pravastatin (PRAVACHOL) 40 MG tablet, TAKE 1 TABLET DAILY, Disp: 90 tablet, Rfl: 0 .  tiotropium (SPIRIVA) 18 MCG inhalation capsule, Place 1 capsule (18 mcg total) into inhaler and inhale daily., Disp: 30 capsule, Rfl: 12

## 2017-04-11 NOTE — Assessment & Plan Note (Signed)
In 2013 he was evaluated by our clinic and at that point he had gold grade C COPD based on his symptoms and moderate airflow obstruction.  Since then he has continued to smoke cigarettes which undoubtedly has contributed to further decline in his lung function. I believe that his dyspnea is due to his COPD based on his symptoms.  He may do better with a combination inhaler which contains all 3 of the forms of controller bronchodilators and steroids that we use for COPD.  He needs to quit smoking right away.  The differential diagnosis of dyspnea is broad and includes cardiac disease as well as bleeding. He did have some GI bleeding over a year ago and as I can tell he has not had a hemoglobin value checked recently.  Plan: Trial of Trelegy, sample given Check CBC Check chest x-ray Simple spirometry today Ambulatory oximetry today If no improvement when follows up in 4 weeks send for cardiac evaluation

## 2017-04-11 NOTE — Assessment & Plan Note (Signed)
Counseled at length today to quit smoking. He would like to try the Nicotrol inhaler, will prescribe.  Given smoking cessation resources.

## 2017-04-12 ENCOUNTER — Ambulatory Visit (HOSPITAL_COMMUNITY)
Admission: RE | Admit: 2017-04-12 | Discharge: 2017-04-12 | Disposition: A | Discharge status: Home / Self Care | Payer: BC Managed Care – PPO | Source: Ambulatory Visit | Attending: Pulmonary Disease | Admitting: Pulmonary Disease

## 2017-04-12 DIAGNOSIS — R0600 Dyspnea, unspecified: Secondary | ICD-10-CM | POA: Insufficient documentation

## 2017-04-12 LAB — PULMONARY FUNCTION TEST
DL/VA % pred: 61 %
DL/VA: 2.79 ml/min/mmHg/L
DLCO COR % PRED: 38 %
DLCO UNC: 12.29 ml/min/mmHg
DLCO cor: 11.82 ml/min/mmHg
DLCO unc % pred: 39 %
FEF 25-75 POST: 1.26 L/s
FEF 25-75 Pre: 0.63 L/sec
FEF2575-%Change-Post: 99 %
FEF2575-%Pred-Post: 43 %
FEF2575-%Pred-Pre: 21 %
FEV1-%CHANGE-POST: 17 %
FEV1-%PRED-POST: 50 %
FEV1-%Pred-Pre: 42 %
FEV1-PRE: 1.49 L
FEV1-Post: 1.75 L
FEV1FVC-%Change-Post: 0 %
FEV1FVC-%Pred-Pre: 80 %
FEV6-%Change-Post: 19 %
FEV6-%PRED-POST: 64 %
FEV6-%Pred-Pre: 54 %
FEV6-POST: 2.85 L
FEV6-PRE: 2.39 L
FEV6FVC-%CHANGE-POST: 1 %
FEV6FVC-%PRED-POST: 105 %
FEV6FVC-%PRED-PRE: 103 %
FVC-%CHANGE-POST: 17 %
FVC-%PRED-POST: 61 %
FVC-%PRED-PRE: 52 %
FVC-POST: 2.85 L
FVC-PRE: 2.43 L
PRE FEV6/FVC RATIO: 98 %
Post FEV1/FVC ratio: 61 %
Post FEV6/FVC ratio: 100 %
Pre FEV1/FVC ratio: 62 %
RV % PRED: 214 %
RV: 4.71 L
TLC % pred: 103 %
TLC: 7.02 L

## 2017-04-12 MED ORDER — ALBUTEROL SULFATE (2.5 MG/3ML) 0.083% IN NEBU
2.5000 mg | INHALATION_SOLUTION | Freq: Once | RESPIRATORY_TRACT | Status: AC
  Administered 2017-04-12: 2.5 mg via RESPIRATORY_TRACT

## 2017-04-16 ENCOUNTER — Telehealth: Payer: Self-pay | Admitting: Pulmonary Disease

## 2017-04-16 ENCOUNTER — Telehealth: Payer: Self-pay | Admitting: Internal Medicine

## 2017-04-16 NOTE — Telephone Encounter (Signed)
Spoke with the pt and notified of results/recs per VS  He verbalized understanding and denied any questions  I have faxed copy to his PCP

## 2017-04-16 NOTE — Telephone Encounter (Signed)
Left a message for a return call.

## 2017-04-16 NOTE — Telephone Encounter (Signed)
PFT 04/12/17 >> FEV1 1.75 (50%), FEV1% 61, TLC 7.02 (103%), RV 4.71 (214%), DLCO 39%, + BD  PFT shows moderate obstruction, air trapping, severe diffusion defect, and bronchodilator responsiveness.  Please inform pt that PFT is consistent with history of COPD with emphysema and positive response to inhalers.  He should continue his current inhaler regimen.  PFT will be reviewed in more detail at his next ROV.  Please have a copy of his PFT sent to his PCP.

## 2017-04-16 NOTE — Telephone Encounter (Signed)
lmtcb for pt.  

## 2017-04-16 NOTE — Telephone Encounter (Signed)
VS  Please Advise as BQ will be out of office for 2 weeks-  Pt called wanting results of his PFT. He is aware BQ is out of the office but states he needs the results sooner and wanted these results sent to his pcp

## 2017-04-16 NOTE — Telephone Encounter (Signed)
Pt returning call.Trevor Mack ° °

## 2017-04-16 NOTE — Telephone Encounter (Signed)
Results of PFTs printed Okay to fax to insurance company Or give results to patient for his records and for the patient to fax or mail

## 2017-04-16 NOTE — Telephone Encounter (Signed)
Pt would like the results of his breathing test. If they are back, pt would like to know so he can signa  Release to have them sent to insurance company.

## 2017-04-30 ENCOUNTER — Institutional Professional Consult (permissible substitution): Payer: BC Managed Care – PPO | Admitting: Internal Medicine

## 2017-04-30 NOTE — Telephone Encounter (Signed)
thanks

## 2017-05-08 DIAGNOSIS — J441 Chronic obstructive pulmonary disease with (acute) exacerbation: Secondary | ICD-10-CM

## 2017-05-09 ENCOUNTER — Encounter: Payer: Self-pay | Admitting: Acute Care

## 2017-05-09 ENCOUNTER — Telehealth: Payer: Self-pay | Admitting: Pulmonary Disease

## 2017-05-09 ENCOUNTER — Ambulatory Visit (INDEPENDENT_AMBULATORY_CARE_PROVIDER_SITE_OTHER): Payer: BC Managed Care – PPO | Admitting: Acute Care

## 2017-05-09 VITALS — BP 144/84 | HR 110 | Ht 69.0 in | Wt 212.6 lb

## 2017-05-09 DIAGNOSIS — J441 Chronic obstructive pulmonary disease with (acute) exacerbation: Secondary | ICD-10-CM

## 2017-05-09 DIAGNOSIS — Z720 Tobacco use: Secondary | ICD-10-CM | POA: Diagnosis not present

## 2017-05-09 DIAGNOSIS — R0609 Other forms of dyspnea: Secondary | ICD-10-CM

## 2017-05-09 MED ORDER — FLUTICASONE-UMECLIDIN-VILANT 100-62.5-25 MCG/INH IN AEPB: 1.0000 | INHALATION_SPRAY | Freq: Every day | RESPIRATORY_TRACT | 0 refills | Status: DC

## 2017-05-09 MED ORDER — NICOTINE 10 MG IN INHA: 1.0000 | Freq: Four times a day (QID) | RESPIRATORY_TRACT | 0 refills | Status: DC

## 2017-05-09 MED ORDER — FLUTICASONE-UMECLIDIN-VILANT 100-62.5-25 MCG/INH IN AEPB: 1.0000 | INHALATION_SPRAY | Freq: Every day | RESPIRATORY_TRACT | 3 refills | Status: DC

## 2017-05-09 NOTE — Telephone Encounter (Signed)
Patient came for appt today and dropped off short term and long term disability forms - fwd to [employer] via interoffice mail -pr

## 2017-05-09 NOTE — Progress Notes (Addendum)
History of Present Illness Trevor Mack is a 61 y.o. male current smoker with COPD. He is followed by Dr. Lake Bells.     05/10/2017 Follow Up OV: Pt. Presents for follow up. He was seen by Dr. Lake Bells on 4/18  For worsening dyspnea. The plan at that time was:   Try taking Trelegy 1 puff daily no matter how you feel instead of Symbicort and Spiriva.   We will call you with the results of the blood test and the chest x-ray Follow-up with our nurse practitioner in 4 weeks to see how you're doing with the new medicine. Quit smoking  Pt is still smoking. He was counseled at length about the need to stop. Patient states he liked trilogy, felt that it did help his breathing. However he ran out of his sleep all and did not call the office to let us know. Therefore he resumed his Symbicort and Spiriva until this office visit. Patient states he is using his rescue inhaler on the average of once daily. He states he is continuing to have cough with beige secretions which she states this is baseline primarily in the morning. He states that he is compliant with his Prilosec, and his maintenance medications. Saturations were 94% today on room air. She continues to have audible wheezing , and is using pursed lip breathing techniques in the office today. Patient denies fever, chest pain, orthopnea, or hemoptysis .We did walk him in the office again today to ensure he was not desaturating with ambulation. He did not desaturate. Patient has dropped off disability paperwork which has been sent to [employer] via  interoffice mail  Test Results:  Pulmonary function test: 2013 simple spirometry ratio 68%, FEV1 2.21 L 59% predicted, FVC 3.27 L 70% predicted  04/12/2017 PFT's  PFT 04/12/17 >> FEV1 1.75 (50%), FEV1% 61, TLC 7.02 (103%), RV 4.71 (214%), DLCO 39%, + BD  PFT shows moderate obstruction, air trapping, severe diffusion defect, and bronchodilator responsiveness.  PFT is consistent with history of COPD with  emphysema and positive response to inhalers.   Moderately severe Obstructive Airways Disease -Reversible Severe Diffusion Defect  Labs: 2013 Alpha-1 value normal  CBC Latest Ref Rng & Units 04/11/2017 03/03/2016 06/25/2015  WBC 4.0 - 10.5 K/uL 11.2(H) 5.3 8.4  Hemoglobin 13.0 - 17.0 g/dL 16.1 15.8 15.8  Hematocrit 39.0 - 52.0 % 48.3 46.7 46.9  Platelets 150.0 - 400.0 K/uL 183.0 107.0(L) 143.0(L)    BMP Latest Ref Rng & Units 06/25/2015 03/28/2010 07/15/2008  Glucose 70 - 99 mg/dL 90 98 124(H)  BUN 6 - 23 mg/dL 16 19 16   Creatinine 0.40 - 1.50 mg/dL 0.91 1.0 0.9  Sodium 135 - 145 mEq/L 141 144 143  Potassium 3.5 - 5.1 mEq/L 4.1 4.3 4.1  Chloride 96 - 112 mEq/L 103 107 108  CO2 19 - 32 mEq/L 29 29 28   Calcium 8.4 - 10.5 mg/dL 9.0 9.2 8.7    BNP No results found for: BNP  ProBNP No results found for: PROBNP  PFT    Component Value Date/Time   FEV1PRE 1.49 04/12/2017 1447   FEV1POST 1.75 04/12/2017 1447   FVCPRE 2.43 04/12/2017 1447   FVCPOST 2.85 04/12/2017 1447   TLC 7.02 04/12/2017 1447   DLCOUNC 12.29 04/12/2017 1447   PREFEV1FVCRT 62 04/12/2017 1447   PSTFEV1FVCRT 61 04/12/2017 1447    Dg Chest 2 View  Result Date: 04/11/2017 CLINICAL DATA:  Worsening shortness of breath, cough, and chest congestion for 2 months. COPD.  EXAM: CHEST  2 VIEW COMPARISON:  12/10/2012 FINDINGS: The heart size and mediastinal contours are within normal limits. Stable pulmonary hyperinflation, consistent with COPD. No evidence of pulmonary infiltrate or edema. No evidence of pleural effusion. IMPRESSION: Stable exam.  COPD.  No active disease. Electronically Signed   By: Earle Gell M.D.   On: 04/11/2017 14:59     Past medical hx Past Medical History:  Diagnosis Date  . ANXIETY 07/23/2008  . COLONIC POLYPS, HX OF 03/26/2006   MULTIPLE FRAGMENTS OF TUBULAR ADENOMAS POLYPS  . Family history of colon cancer    Father   . GERD 07/23/2008  . HYPERLIPIDEMIA 07/23/2008  . HYPERTENSION 07/23/2008  .  TENDINITIS, LEFT THUMB 07/15/2010     Social History  Substance Use Topics  . Smoking status: Current Every Day Smoker    Packs/day: 2.50    Years: 35.00    Types: Cigarettes  . Smokeless tobacco: Never Used     Comment: Started smoking at age 23.  Currently smoking 1 1/2 ppd.  . Alcohol use No    Tobacco Cessation: Patient continues to smoke despite worsening pulmonary status. I have spent 5 minutes counseling patient on smoking cessation this visit. I have told him he has reached a point in time where his inability to breathe requires him to stop smoking.  Past surgical hx, Family hx, Social hx all reviewed.  Current Outpatient Prescriptions on File Prior to Visit  Medication Sig  . albuterol (PROAIR HFA) 108 (90 Base) MCG/ACT inhaler Inhale 2 puffs into the lungs every 6 (six) hours as needed for wheezing or shortness of breath.  . budesonide-formoterol (SYMBICORT) 160-4.5 MCG/ACT inhaler Inhale 2 puffs into the lungs 2 (two) times daily.  . citalopram (CELEXA) 40 MG tablet TAKE 1 TABLET DAILY (NEEDS PHYSICAL)  . diphenoxylate-atropine (LOMOTIL) 2.5-0.025 MG per tablet Take 1 tablet by mouth 4 (four) times daily as needed for diarrhea or loose stools.  Marland Kitchen ibuprofen (ADVIL) 200 MG tablet Take 400 mg by mouth every 6 (six) hours as needed.  Marland Kitchen LORazepam (ATIVAN) 0.5 MG tablet Take 1 tablet (0.5 mg total) by mouth 2 (two) times daily as needed.  Marland Kitchen losartan (COZAAR) 100 MG tablet TAKE 1 TABLET DAILY. NEEDS PHYSICAL  . omeprazole (PRILOSEC) 40 MG capsule Take 1 capsule (40 mg total) by mouth daily.  . ondansetron (ZOFRAN) 4 MG tablet Take 1 tablet (4 mg total) by mouth every 8 (eight) hours as needed for nausea or vomiting.  . pravastatin (PRAVACHOL) 40 MG tablet TAKE 1 TABLET DAILY  . tiotropium (SPIRIVA) 18 MCG inhalation capsule Place 1 capsule (18 mcg total) into inhaler and inhale daily.   No current facility-administered medications on file prior to visit.      No Known  Allergies  Review Of Systems:  Constitutional:   No  weight loss, night sweats,  Fevers, chills, fatigue, or  lassitude.  HEENT:   No headaches,  Difficulty swallowing,  Tooth/dental problems, or  Sore throat,                No sneezing, itching, ear ache, nasal congestion, post nasal drip,   CV:  No chest pain,  Orthopnea, PND, swelling in lower extremities, anasarca, dizziness, palpitations, syncope.   GI  No heartburn, indigestion, abdominal pain, nausea, vomiting, diarrhea, change in bowel habits, loss of appetite, bloody stools.   Resp: + shortness of breath with exertion or at rest.  + excess mucus, + productive cough,  No non-productive cough,  No coughing up of blood.  + change in color of mucus.  + wheezing.  No chest wall deformity  Skin: no rash or lesions.  GU: no dysuria, change in color of urine, no urgency or frequency.  No flank pain, no hematuria   MS:  No joint pain or swelling.  No decreased range of motion.  No back pain.  Psych:  No change in mood or affect. No depression or anxiety.  No memory loss.   Vital Signs BP (!) 144/84 (BP Location: Right Arm, Cuff Size: Normal)   Pulse (!) 110   Ht 5\' 9"  (1.753 m)   Wt 212 lb 9.6 oz (96.4 kg)   SpO2 94%   BMI 31.40 kg/m    Physical Exam:  General- No distress,  A&Ox3, pleasant ENT: No sinus tenderness, TM clear, pale nasal mucosa, no oral exudate,no post nasal drip, no LAN Cardiac: S1, S2, regular rate and rhythm, no murmur Chest: Expiratory wheeze/ no rales/ dullness; no accessory muscle use, no nasal flaring, no sternal retractions Abd.: Soft Non-tender, obese Ext: No clubbing cyanosis, trace lower extremity edema Neuro:  normal strength Skin: No rashes, warm and dry Psych: normal mood and behavior, anxious at times   Assessment/Plan  Chronic obstructive lung disease with acute on chronic bronchitis and exacerbation gold stage C Therapeutic trial of Trelegy Patient stated he did feel trilogy was more  effective than Symbicort and Spiriva Patient did not call office to let us know when he ran out of sample Patient resumed Symbicort and Spiriva once sample ran out Patient with considerable wheezing today on exam Plan We will give you a breathing treatment today in the office. Doxycycline 100 mg BID x 7 days Prednisone taper We will refer you for a cardiology evaluation to be thorough. We will send in a prescription for Trelegy. We will give you additional samples of Trelegy today. Follow up with Dr. Lake Bells or NP in 4 Weeks to ensure you're doing better and to check your status on smoking cessation. Please contact office for sooner follow up if symptoms do not improve or worsen or seek emergency care    Tobacco use Patient has continued ongoing tobacco abuse despite counseling by Dr. Lake Bells at previous visit Patient continues to complain of dyspnea Plan We will give you a breathing treatment today in the office. We will refer you for Lung Cancer Screening Program Please stop smoking. This is the single most powerful action that you can take to improve your lung function and decrease your risk of lung cancer. We will prescribe a nicotine inhaler ( Nicotrol) to assist with nicotine cravings while you are quitting.  Use this inhaler up to 4 times daily to help with smoking cessation. This is to replace smoking. Follow up with Dr. Lake Bells or NP in 4 Weeks. Please contact office for sooner follow up if symptoms do not improve or worsen or seek emergency care    Patient was counseled extensively during today's office visit regarding necessity to stop smoking immediately.   Magdalen Spatz, NP 05/10/2017  8:30 AM

## 2017-05-09 NOTE — Patient Instructions (Addendum)
It is nice to meet you today. We will give you a breathing treatment today in the office. We will refer you for Lung Cancer Screening Program Please stop smoking. This is the single most powerful action that you can take to improve your lung function and decrease your risk of lung cancer. We will prescribe a nicotine inhaler ( Nicotrol) to assist with nicotine cravings while you are quitting.  Use this inhaler up to 4 times daily to help with smoking cessation. This is to replace smoking. We will refer you for a cardiology evaluation to be thorough. We will send in a prescription for Trelegy. We will give you additional samples. Follow up with Dr. Lake Bells or NP in 4 Weeks. Please contact office for sooner follow up if symptoms do not improve or worsen or seek emergency care

## 2017-05-10 ENCOUNTER — Other Ambulatory Visit: Payer: Self-pay | Admitting: Internal Medicine

## 2017-05-10 ENCOUNTER — Encounter: Payer: Self-pay | Admitting: Acute Care

## 2017-05-10 ENCOUNTER — Telehealth: Payer: Self-pay | Admitting: Acute Care

## 2017-05-10 MED ORDER — PREDNISONE 10 MG PO TABS: ORAL_TABLET | ORAL | 0 refills | Status: DC

## 2017-05-10 MED ORDER — DOXYCYCLINE HYCLATE 100 MG PO TABS: 100.0000 mg | ORAL_TABLET | Freq: Two times a day (BID) | ORAL | 0 refills | Status: DC

## 2017-05-10 MED ORDER — LORAZEPAM 0.5 MG PO TABS: 0.5000 mg | ORAL_TABLET | Freq: Two times a day (BID) | ORAL | 1 refills | Status: DC | PRN

## 2017-05-10 NOTE — Assessment & Plan Note (Addendum)
Therapeutic trial of Trelegy Patient stated he did feel trilogy was more effective than Symbicort and Spiriva Patient did not call office to let us know when he ran out of sample Patient resumed Symbicort and Spiriva once sample ran out Patient with considerable wheezing today on exam Plan We will give you a breathing treatment today in the office. Doxycycline 100 mg BID x 7 days Prednisone taper We will refer you for a cardiology evaluation to be thorough. We will send in a prescription for Trelegy. We will give you additional samples of Trelegy today. Follow up with Dr. Lake Bells or NP in 4 Weeks to ensure you're doing better and to check your status on smoking cessation. Please contact office for sooner follow up if symptoms do not improve or worsen or seek emergency care

## 2017-05-10 NOTE — Telephone Encounter (Signed)
Please call patient and let him know we are sending in a prescription for Doxycycline 100 mg BID x 7 days and a Prednisone taper; 10 mg tablets: 4 tabs x 2 days, 3 tabs x 2 days, 2 tabs x 2 days 1 tab x 2 days then stop. Let him know I was concerned about how much wheezing he was having yesterday. Have him follow the rest of the plan of care developed yesterday,and to follow up with Dr. Lake Bells as scheduled. Thank you.

## 2017-05-10 NOTE — Assessment & Plan Note (Signed)
Patient has continued ongoing tobacco abuse despite counseling by Dr. Lake Bells at previous visit Patient continues to complain of dyspnea Plan We will give you a breathing treatment today in the office. We will refer you for Lung Cancer Screening Program Please stop smoking. This is the single most powerful action that you can take to improve your lung function and decrease your risk of lung cancer. We will prescribe a nicotine inhaler ( Nicotrol) to assist with nicotine cravings while you are quitting.  Use this inhaler up to 4 times daily to help with smoking cessation. This is to replace smoking. Follow up with Dr. Lake Bells or NP in 4 Weeks. Please contact office for sooner follow up if symptoms do not improve or worsen or seek emergency care

## 2017-05-10 NOTE — Telephone Encounter (Signed)
lmtcb x1 for pt. 

## 2017-05-10 NOTE — Telephone Encounter (Signed)
Pt is aware of below message.  Rx has been sent to preferred pharmacy. Nothing further needed.

## 2017-05-10 NOTE — Telephone Encounter (Signed)
Patient returning calling back - He can be reached at [phone] -pr

## 2017-05-11 ENCOUNTER — Ambulatory Visit: Payer: BC Managed Care – PPO | Admitting: Internal Medicine

## 2017-05-11 NOTE — Progress Notes (Signed)
Reviewed, agree 

## 2017-05-14 ENCOUNTER — Telehealth: Payer: Self-pay | Admitting: Pulmonary Disease

## 2017-05-14 NOTE — Telephone Encounter (Signed)
Hold doxycycline Eat yogurt or a probiotic twice per day for the next 5 days If no improvement in diarrhea or abdominal needs to be seen by [place] or PCP for stool testing  Continue prednisone  Call if breathing worse despite taking prednisone.

## 2017-05-14 NOTE — Telephone Encounter (Signed)
BQ - please advise. Thanks. 

## 2017-05-14 NOTE — Telephone Encounter (Signed)
Spoke with the pt and notified of recs per BQ  He verbalized understanding  Will call if not improving

## 2017-05-14 NOTE — Telephone Encounter (Signed)
PM  Please Advise in BQ's absence-    Pt was prescribed doxy and prednisone on 05/10/17. Pt started both medications on 05/11/17. Pt states ever since he started both medications he has been having diarrhea and abdominal discomfort, he states his breathing has remained unchanged. He is still coughing with tanish mucus,sob,wheezing Denies chest tightness. He did not know if something else could be prescribed.   No Known Allergies     FYI to nurse: I did the PA for pt's trelegy, it was approved for dates 04/14/17-11/14/17 pt and pharmacy are aware

## 2017-05-17 ENCOUNTER — Telehealth: Payer: Self-pay

## 2017-05-17 NOTE — Telephone Encounter (Signed)
Received a fax from [employer] approving Trelegy until 11/14/2017.

## 2017-05-24 DIAGNOSIS — E785 Hyperlipidemia, unspecified: Secondary | ICD-10-CM

## 2017-05-24 DIAGNOSIS — I10 Essential (primary) hypertension: Secondary | ICD-10-CM

## 2017-05-24 NOTE — Telephone Encounter (Signed)
Rec'd disability forms via interoffice mail from [place] to [facility] for BQ to complete -pr

## 2017-05-30 NOTE — Telephone Encounter (Signed)
Forms given to BQ for completion

## 2017-06-01 ENCOUNTER — Telehealth: Payer: Self-pay | Admitting: Family Medicine

## 2017-06-01 NOTE — Telephone Encounter (Signed)
Forms are completed and in Dr. Marthann Schiller basket.

## 2017-06-01 NOTE — Telephone Encounter (Signed)
Pt dropped off disability paper work on 05/24/17.  Dr. Raliegh Ip not in the office.  Will return on 06/04/17.

## 2017-06-04 ENCOUNTER — Other Ambulatory Visit: Payer: Self-pay | Admitting: Internal Medicine

## 2017-06-04 NOTE — Telephone Encounter (Signed)
Called and left a message informing that the paperwork is available for pick up at the front desk.  Copy sent to scan and I retained a copy for my records.

## 2017-06-05 NOTE — Telephone Encounter (Signed)
Rec'd disability forms - fwd to [employer] via interoffice mail -pr

## 2017-06-08 ENCOUNTER — Encounter: Payer: Self-pay | Admitting: Pulmonary Disease

## 2017-06-08 ENCOUNTER — Ambulatory Visit (INDEPENDENT_AMBULATORY_CARE_PROVIDER_SITE_OTHER): Payer: BC Managed Care – PPO | Admitting: Pulmonary Disease

## 2017-06-08 DIAGNOSIS — J441 Chronic obstructive pulmonary disease with (acute) exacerbation: Secondary | ICD-10-CM

## 2017-06-08 DIAGNOSIS — Z720 Tobacco use: Secondary | ICD-10-CM

## 2017-06-08 MED ORDER — FLUTICASONE-UMECLIDIN-VILANT 100-62.5-25 MCG/INH IN AEPB: 1.0000 | INHALATION_SPRAY | Freq: Every day | RESPIRATORY_TRACT | 0 refills | Status: DC

## 2017-06-08 NOTE — Patient Instructions (Signed)
Stop smoking Take Trelegy 1 puff daily We will see you back in 6 months or sooner if needed

## 2017-06-08 NOTE — Progress Notes (Signed)
Subjective:    Patient ID: Trevor Mack, male    DOB: [birth year], 61 y.o.   MRN: [id number]  Synopsis: Former patient of Dr. Joya Gaskins with COPD.   HPI  Chief Complaint  Patient presents with  . Follow-up    patient is doing better after the medications you gave him . he needs more Albuteral inhaler   Kaeo says that the inhaler (Trelegy) has been helpful with his breathing.  He hasn't really tried to walk farther.  He is still coughing up mucus. He has not been using albuterol. He is still smoking 1.5 cigarettes per day.     Past Medical History:  Diagnosis Date  . ANXIETY 07/23/2008  . COLONIC POLYPS, HX OF 03/26/2006   MULTIPLE FRAGMENTS OF TUBULAR ADENOMAS POLYPS  . Family history of colon cancer    Father   . GERD 07/23/2008  . HYPERLIPIDEMIA 07/23/2008  . HYPERTENSION 07/23/2008  . TENDINITIS, LEFT THUMB 07/15/2010     Review of Systems     Objective:   Physical Exam  Vitals:   06/08/17 1323  BP: (!) 130/98  Pulse: 100  SpO2: 95%  Weight: 214 lb 9.6 oz (97.3 kg)  Height: 5\' 9"  (1.753 m)   RA  Gen: well appearing HENT: OP clear, TM's clear, neck supple PULM: CTA B, normal percussion CV: RRR, no mgr, trace edema GI: BS+, soft, nontender Derm: no cyanosis or rash Psyche: normal mood and affect  PFT: April 2018: Ratio 61%, FEV1 1.75 L 17% change, 50% predicted, FVC 2.85 L 61% predicted, total lung capacity 7.02 L 103% predicted, residual volume 214% predicted, DLCO 12.29 39% predicted      Assessment & Plan:  Tobacco use Counseled at length to quit smoking today   Chronic obstructive lung disease with acute on chronic bronchitis and exacerbation gold stage C His symptoms have improved since he started taking Trelegy. However, he continues to have chest congestion which is undoubtedly due to the fact that he continues to smoke cigarettes  He was counseled today on the importance of quitting smoking.  Plan: Trelegy 1 puff daily Albuterol as  needed Quit smoking Follow-up 6 months    Current Outpatient Prescriptions:  .  albuterol (PROAIR HFA) 108 (90 Base) MCG/ACT inhaler, Inhale 2 puffs into the lungs every 6 (six) hours as needed for wheezing or shortness of breath., Disp: 3 Inhaler, Rfl: 3 .  citalopram (CELEXA) 40 MG tablet, TAKE 1 TABLET DAILY (NEEDS PHYSICAL), Disp: 90 tablet, Rfl: 1 .  diphenoxylate-atropine (LOMOTIL) 2.5-0.025 MG per tablet, Take 1 tablet by mouth 4 (four) times daily as needed for diarrhea or loose stools., Disp: 30 tablet, Rfl: 0 .  doxycycline (VIBRA-TABS) 100 MG tablet, Take 1 tablet (100 mg total) by mouth 2 (two) times daily., Disp: 14 tablet, Rfl: 0 .  Fluticasone-Umeclidin-Vilant (TRELEGY ELLIPTA) 100-62.5-25 MCG/INH AEPB, Inhale 1 puff into the lungs daily., Disp: 56 each, Rfl: 0 .  ibuprofen (ADVIL) 200 MG tablet, Take 400 mg by mouth every 6 (six) hours as needed., Disp: , Rfl:  .  LORazepam (ATIVAN) 0.5 MG tablet, Take 1 tablet (0.5 mg total) by mouth 2 (two) times daily as needed., Disp: 180 tablet, Rfl: 1 .  losartan (COZAAR) 100 MG tablet, TAKE 1 TABLET DAILY. NEEDS PHYSICAL, Disp: 90 tablet, Rfl: 1 .  nicotine (NICOTROL) 10 MG inhaler, Inhale 1 Cartridge (1 continuous puffing total) into the lungs 4 (four) times daily., Disp: 42 each, Rfl: 0 .  omeprazole (PRILOSEC) 40  MG capsule, Take 1 capsule (40 mg total) by mouth daily., Disp: 90 capsule, Rfl: 3 .  ondansetron (ZOFRAN) 4 MG tablet, Take 1 tablet (4 mg total) by mouth every 8 (eight) hours as needed for nausea or vomiting., Disp: 20 tablet, Rfl: 0 .  pravastatin (PRAVACHOL) 40 MG tablet, TAKE 1 TABLET DAILY, Disp: 90 tablet, Rfl: 0 .  pravastatin (PRAVACHOL) 40 MG tablet, TAKE 1 TABLET DAILY NEEDS  PHYSICAL, Disp: 90 tablet, Rfl: 1

## 2017-06-08 NOTE — Assessment & Plan Note (Signed)
Counseled at length to quit smoking today. 

## 2017-06-08 NOTE — Addendum Note (Signed)
Addended by: Chase Picket A on: 06/08/2017 01:47 PM   Modules accepted: Orders

## 2017-06-08 NOTE — Assessment & Plan Note (Signed)
His symptoms have improved since he started taking Trelegy. However, he continues to have chest congestion which is undoubtedly due to the fact that he continues to smoke cigarettes  He was counseled today on the importance of quitting smoking.  Plan: Trelegy 1 puff daily Albuterol as needed Quit smoking Follow-up 6 months

## 2017-06-09 ENCOUNTER — Other Ambulatory Visit: Payer: Self-pay | Admitting: Internal Medicine

## 2017-06-15 ENCOUNTER — Ambulatory Visit: Payer: BC Managed Care – PPO | Admitting: Internal Medicine

## 2017-09-21 ENCOUNTER — Other Ambulatory Visit: Payer: Self-pay | Admitting: Internal Medicine

## 2017-10-01 ENCOUNTER — Other Ambulatory Visit: Payer: Self-pay | Admitting: Acute Care

## 2017-10-01 DIAGNOSIS — J441 Chronic obstructive pulmonary disease with (acute) exacerbation: Secondary | ICD-10-CM

## 2017-10-04 ENCOUNTER — Telehealth: Payer: Self-pay | Admitting: Internal Medicine

## 2017-10-04 NOTE — Telephone Encounter (Signed)
[facility] - Client [facility]     Patient Name: Trevor Mack Initial Comment Caller is having arm pain from arthritis   DOB: 1956/04/10      Nurse Assessment  Nurse: [name], RN, Lattie Haw Date/Time (Eastern Time): 10/04/2017 3:03:12 PM  Confirm and document reason for call. If symptomatic, describe symptoms. ---Caller states has left arm pain and hasn't been able to sleep d/t pain x 1 wk, worsened yesterday; states pain goes between arm and shoulder   Does the patient have any new or worsening symptoms? ---Yes   Will a triage be completed? ---Yes   Related visit to physician within the last 2 weeks? ---No   Does the PT have any chronic conditions? (i.e. diabetes, asthma, etc.) ---Yes   List chronic conditions. ---hx bursitis in left shoulder , COPD, HTN, high cholesterol, stomach problems   Is this a behavioral health or substance abuse call? ---No     Guidelines     Guideline Title Affirmed Question Affirmed Notes   Arm Pain [1] MODERATE pain (e.g., interferes with normal activities) AND [2] present > 3 days    Final Disposition User   See PCP When Office is Open (within 3 days) [place], RN, Lattie Haw   Comments   caller states will not be able to pay co-pay for appt until next week; called Malachy Mood on back line at office who states, if pt wants to be seen, they will see him and bill him for the co-pay   called pt back for appt, but no answer, left message on voice mail to call back to schedule appt     Caller Disagree/Comply Comply  Caller Understands Yes  PreDisposition Home Care

## 2017-10-05 ENCOUNTER — Ambulatory Visit (INDEPENDENT_AMBULATORY_CARE_PROVIDER_SITE_OTHER): Payer: BC Managed Care – PPO | Admitting: Family Medicine

## 2017-10-05 ENCOUNTER — Encounter: Payer: Self-pay | Admitting: Family Medicine

## 2017-10-05 VITALS — BP 132/80 | HR 102 | Temp 98.3°F | Ht 69.0 in | Wt 213.9 lb

## 2017-10-05 DIAGNOSIS — Z23 Encounter for immunization: Secondary | ICD-10-CM

## 2017-10-05 DIAGNOSIS — M79602 Pain in left arm: Secondary | ICD-10-CM

## 2017-10-05 MED ORDER — NAPROXEN 500 MG PO TABS: 500.0000 mg | ORAL_TABLET | Freq: Two times a day (BID) | ORAL | 0 refills | Status: DC

## 2017-10-05 NOTE — Addendum Note (Signed)
Addended by: Agnes Lawrence on: 10/05/2017 11:14 AM   Modules accepted: Orders

## 2017-10-05 NOTE — Progress Notes (Signed)
HPI:  Acute visit for L arm pain: For about 2 weeks He cant remember inciting event but is L handed Pain is dull, moderate, primarily in the flexor tendon region of the left arm, he has some pain in the left wrist and elbow at times, but this is inconsistent. Reports he sometimes has shoulder pain as well.  No fevers, malaise, swelling, redness, rashes, known injury, weakness, numbness or burning pain Reports he cannot take pain pills such as Percocet as they stop him up and cause constipation and upset his stomach Denies any kidney disease, heart disease or history of GI bleeding or gastritis   ROS: See pertinent positives and negatives per HPI.  Past Medical History:  Diagnosis Date  . ANXIETY 07/23/2008  . COLONIC POLYPS, HX OF 03/26/2006   MULTIPLE FRAGMENTS OF TUBULAR ADENOMAS POLYPS  . Family history of colon cancer    Father   . GERD 07/23/2008  . HYPERLIPIDEMIA 07/23/2008  . HYPERTENSION 07/23/2008  . TENDINITIS, LEFT THUMB 07/15/2010    Past Surgical History:  Procedure Laterality Date  . LUMBAR LAMINECTOMY      Family History  Problem Relation Age of Onset  . Colon cancer Father   . Hypertension Father   . Diabetes Father   . Heart attack Father     Social History   Social History  . Marital status: Married    Spouse name: N/A  . Number of children: 0  . Years of education: N/A   Occupational History  .  [employer] History Main Topics  . Smoking status: Current Every Day Smoker    Packs/day: 2.50    Years: 35.00    Types: Cigarettes  . Smokeless tobacco: Never Used     Comment: Started smoking at age 46.  Currently smoking 1 1/2 ppd.  . Alcohol use No  . Drug use: No  . Sexual activity: Not Asked   Other Topics Concern  . None   Social History Narrative  . None     Current Outpatient Prescriptions:  .  citalopram (CELEXA) 40 MG tablet, TAKE 1 TABLET DAILY (NEEDS PHYSICAL), Disp: 90 tablet, Rfl: 1 .   Fluticasone-Umeclidin-Vilant (TRELEGY ELLIPTA) 100-62.5-25 MCG/INH AEPB, Inhale 1 puff into the lungs daily., Disp: 56 each, Rfl: 0 .  ibuprofen (ADVIL) 200 MG tablet, Take 400 mg by mouth every 6 (six) hours as needed., Disp: , Rfl:  .  LORazepam (ATIVAN) 0.5 MG tablet, Take 1 tablet (0.5 mg total) by mouth 2 (two) times daily as needed., Disp: 180 tablet, Rfl: 1 .  losartan (COZAAR) 100 MG tablet, TAKE 1 TABLET DAILY. NEEDS PHYSICAL, Disp: 90 tablet, Rfl: 1 .  omeprazole (PRILOSEC) 40 MG capsule, Take 1 capsule (40 mg total) by mouth daily., Disp: 90 capsule, Rfl: 3 .  pravastatin (PRAVACHOL) 40 MG tablet, TAKE 1 TABLET DAILY, Disp: 90 tablet, Rfl: 0 .  TRELEGY ELLIPTA 100-62.5-25 MCG/INH AEPB, INHALE 1 PUFF INTO LUNGS ONCE DAILY, Disp: 60 each, Rfl: 3 .  naproxen (NAPROSYN) 500 MG tablet, Take 1 tablet (500 mg total) by mouth 2 (two) times daily with a meal., Disp: 14 tablet, Rfl: 0  EXAM:  Vitals:   10/05/17 1003  BP: 132/80  Pulse: (!) 102  Temp: 98.3 F (36.8 C)    Body mass index is 31.59 kg/m.  GENERAL: vitals reviewed and listed above, alert, oriented, appears well hydrated and in no acute distress  HEENT: atraumatic, conjunttiva clear, no obvious abnormalities on inspection  of external nose and ears  NECK: no obvious masses on inspection  LUNGS: clear to auscultation bilaterally, no wheezes, rales or rhonchi, good air movement  CV: HRRR, no peripheral edema  MS: moves all extremities without noticeable abnormality,on inspection of both upper extremities there is no rash, redness or swelling, tenderness to palpation focally in the left flexor muscles/tendons of the left forearm, normal strength, sensitivity to light touch and cap refill in both upper extremities bilaterally, normal range of motion of the head and neck without reproduction of symptoms, negative Spurling  PSYCH: pleasant and cooperative, no obvious depression or anxiety  ASSESSMENT AND PLAN:  Discussed  the following assessment and plan:  Left arm pain  -we discussed possible serious and likely etiologies, workup and treatment, treatment risks and return precautions - given focal tenderness to palpation, suspect soft tissue etiology most likely -after this discussion, Trevor Mack opted for conservative treatment with ice, topical menthol, naproxen after discussion of risks, home exercises -follow up advised 2 weeks, may need imaging/further evaluation if persistent symptoms,particularly given his history of heavy smoking -of course, we advised Trevor Mack  to return or notify a doctor immediately if symptoms worsen or persist or new concerns arise.   Patient Instructions  BEFORE YOU LEAVE: -exercises -follow up: 2 weeks  Ice as needed. Topical menthol as needed (Tiger balm is good) this is available over the counter.  Use the Naproxen as needed for pain per instructions but do NOT take more then 1 tablet twice daily.  Do the exercises provided.  I hope you are feeling better soon! Follow up or seek care sooner if worsening, new concerns or you are not improving with treatment.      Trevor Benton R., DO

## 2017-10-05 NOTE — Patient Instructions (Addendum)
BEFORE YOU LEAVE: -exercises -follow up: 2 weeks  Ice as needed. Topical menthol as needed (Tiger balm is good) this is available over the counter.  Use the Naproxen as needed for pain per instructions but do NOT take more then 1 tablet twice daily.  Do the exercises provided.  I hope you are feeling better soon! Follow up or seek care sooner if worsening, new concerns or you are not improving with treatment.

## 2017-10-11 ENCOUNTER — Telehealth: Payer: Self-pay | Admitting: Pulmonary Disease

## 2017-10-11 NOTE — Telephone Encounter (Signed)
Will route to [place] to follow up after BQ signature.

## 2017-10-17 NOTE — Telephone Encounter (Signed)
Please advise if these forms have been completed. Thanks. 

## 2017-10-19 ENCOUNTER — Ambulatory Visit: Payer: BC Managed Care – PPO | Admitting: Pulmonary Disease

## 2017-10-19 NOTE — Progress Notes (Deleted)
   Subjective:    Patient ID: Trevor Mack, male    DOB: 1956-07-29, 61 y.o.   MRN: [id number]  Synopsis: Former patient of Dr. Joya Gaskins with COPD.   HPI  No chief complaint on file.  ***  Past Medical History:  Diagnosis Date  . ANXIETY 07/23/2008  . COLONIC POLYPS, HX OF 03/26/2006   MULTIPLE FRAGMENTS OF TUBULAR ADENOMAS POLYPS  . Family history of colon cancer    Father   . GERD 07/23/2008  . HYPERLIPIDEMIA 07/23/2008  . HYPERTENSION 07/23/2008  . TENDINITIS, LEFT THUMB 07/15/2010     Review of Systems     Objective:   Physical Exam  There were no vitals filed for this visit. RA  ***  PFT: April 2018: Ratio 61%, FEV1 1.75 L 17% change, 50% predicted, FVC 2.85 L 61% predicted, total lung capacity 7.02 L 103% predicted, residual volume 214% predicted, DLCO 12.29 39% predicted      Assessment & Plan:   No diagnosis found.  Discussion: ***    Current Outpatient Prescriptions:  .  citalopram (CELEXA) 40 MG tablet, TAKE 1 TABLET DAILY (NEEDS PHYSICAL), Disp: 90 tablet, Rfl: 1 .  Fluticasone-Umeclidin-Vilant (TRELEGY ELLIPTA) 100-62.5-25 MCG/INH AEPB, Inhale 1 puff into the lungs daily., Disp: 56 each, Rfl: 0 .  ibuprofen (ADVIL) 200 MG tablet, Take 400 mg by mouth every 6 (six) hours as needed., Disp: , Rfl:  .  LORazepam (ATIVAN) 0.5 MG tablet, Take 1 tablet (0.5 mg total) by mouth 2 (two) times daily as needed., Disp: 180 tablet, Rfl: 1 .  losartan (COZAAR) 100 MG tablet, TAKE 1 TABLET DAILY. NEEDS PHYSICAL, Disp: 90 tablet, Rfl: 1 .  naproxen (NAPROSYN) 500 MG tablet, Take 1 tablet (500 mg total) by mouth 2 (two) times daily with a meal., Disp: 14 tablet, Rfl: 0 .  omeprazole (PRILOSEC) 40 MG capsule, Take 1 capsule (40 mg total) by mouth daily., Disp: 90 capsule, Rfl: 3 .  pravastatin (PRAVACHOL) 40 MG tablet, TAKE 1 TABLET DAILY, Disp: 90 tablet, Rfl: 0 .  TRELEGY ELLIPTA 100-62.5-25 MCG/INH AEPB, INHALE 1 PUFF INTO LUNGS ONCE DAILY, Disp: 60 each, Rfl: 3

## 2017-10-19 NOTE — Telephone Encounter (Signed)
Yes Patient coming in today 14

## 2017-10-24 ENCOUNTER — Other Ambulatory Visit: Payer: Self-pay | Admitting: Internal Medicine

## 2017-10-24 NOTE — Telephone Encounter (Signed)
Rec'd completed forms back from [place] to [employer] via [employer] -pr

## 2017-10-30 ENCOUNTER — Encounter: Payer: Self-pay | Admitting: Pulmonary Disease

## 2017-10-30 ENCOUNTER — Ambulatory Visit: Payer: BC Managed Care – PPO | Admitting: Pulmonary Disease

## 2017-10-30 ENCOUNTER — Telehealth: Payer: Self-pay | Admitting: Internal Medicine

## 2017-10-30 VITALS — BP 118/78 | HR 93 | Ht 69.0 in | Wt 214.8 lb

## 2017-10-30 DIAGNOSIS — J441 Chronic obstructive pulmonary disease with (acute) exacerbation: Secondary | ICD-10-CM | POA: Diagnosis not present

## 2017-10-30 DIAGNOSIS — R400 Somnolence: Secondary | ICD-10-CM

## 2017-10-30 DIAGNOSIS — Z720 Tobacco use: Secondary | ICD-10-CM | POA: Diagnosis not present

## 2017-10-30 DIAGNOSIS — F329 Major depressive disorder, single episode, unspecified: Secondary | ICD-10-CM | POA: Diagnosis not present

## 2017-10-30 DIAGNOSIS — Z23 Encounter for immunization: Secondary | ICD-10-CM | POA: Diagnosis not present

## 2017-10-30 DIAGNOSIS — F32A Depression, unspecified: Secondary | ICD-10-CM

## 2017-10-30 MED ORDER — LORAZEPAM 0.5 MG PO TABS: 0.5000 mg | ORAL_TABLET | Freq: Two times a day (BID) | ORAL | 1 refills | Status: DC | PRN

## 2017-10-30 NOTE — Progress Notes (Signed)
Subjective:    Patient ID: Trevor Mack, male    DOB: 1956-08-08, 61 y.o.   MRN: [id number]  Synopsis: Former patient of Dr. Joya Gaskins with COPD.   HPI  Chief Complaint  Patient presents with  . Follow-up    Pt doing better with new medications from last visit. Pt still having SOB with exertion. Pt is having chest pain in last month.   Since June he has been better.  He says that the Trelegy helps a lot.  He has not had bronchitis.  Still wheezing some.  He stays at home, he hasn't exercised too much because of dyspnea.  He still has a cough but it isn't as bad.  He is still smoking, has been smoking 1.5 packs per day.  He is applying for disability.    He has had a flu shot.    He says that he stays sleepy all the time and he sleeps during the daytime all day.  He drinks a couple cups of coffee in the mornings.  He snores.  He always has morning headaches.    Past Medical History:  Diagnosis Date  . ANXIETY 07/23/2008  . COLONIC POLYPS, HX OF 03/26/2006   MULTIPLE FRAGMENTS OF TUBULAR ADENOMAS POLYPS  . Family history of colon cancer    Father   . GERD 07/23/2008  . HYPERLIPIDEMIA 07/23/2008  . HYPERTENSION 07/23/2008  . TENDINITIS, LEFT THUMB 07/15/2010     Review of Systems  Constitutional: Positive for fatigue. Negative for chills and fever.  HENT: Negative for rhinorrhea, sinus pressure and sinus pain.   Respiratory: Positive for cough and shortness of breath. Negative for wheezing.   Cardiovascular: Negative for chest pain, palpitations and leg swelling.       Objective:   Physical Exam  Vitals:   10/30/17 1110  BP: 118/78  Pulse: 93  SpO2: 96%  Weight: 214 lb 12.8 oz (97.4 kg)  Height: 5\' 9"  (1.753 m)   RA  Gen: well appearing HENT: OP clear, TM's clear, neck supple, neck circumference is 19 inches PULM: CTA B, normal percussion CV: RRR, no mgr, trace edema GI: BS+, soft, nontender Derm: no cyanosis or rash Psyche: normal mood and  affect   PFT: April 2018: Ratio 61%, FEV1 1.75 L 17% change, 50% predicted, FVC 2.85 L 61% predicted, total lung capacity 7.02 L 103% predicted, residual volume 214% predicted, DLCO 12.29 39% predicted  Records from primary care reviewed, he was asked with a musculoskeletal strain of the left shoulder been seen earlier in October    Assessment & Plan:    Daytime somnolence - Plan: Home sleep test  Tobacco use  Chronic obstructive lung disease with acute on chronic bronchitis and exacerbation gold stage C  Depression, unspecified depression type  Discussion: From a COPD standpoint things have been stable though unfortunately he continues to smoke cigarettes.  He was counseled today at length to quit smoking.  He really needs to exercise more encourage this today.  We spent a significant amount of time talking about his sleepiness during the daytime and lack of fatigue.  He thinks this may be related to depression.  He contracts with me for safety and says that he does not have any suicidal thoughts.  While depression may be contributing to this his likelihood of having sleep apnea is exceedingly high.  We will arrange for a sleep study.  I think if we treated sleep apnea he would feel quite a bit better.  Plan: Fatigue and daytime somnolence: We will arrange a home sleep study and call you with the results  Depression: Continue taking Celexa as you are doing I will talk to your primary care doctor about this I also think that sleep apnea (very likely) may be contributing Try to get out and exercise, this will help quite a bit  Echo abuse: Counseled to quit  COPD: Continue Trelegy 1 puff daily Try to exercise as much as possible  We will see you back in 3 months or sooner if the sleep study is positive    Current Outpatient Medications:  .  citalopram (CELEXA) 40 MG tablet, TAKE 1 TABLET DAILY (NEEDS PHYSICAL), Disp: 90 tablet, Rfl: 1 .  Fluticasone-Umeclidin-Vilant  (TRELEGY ELLIPTA) 100-62.5-25 MCG/INH AEPB, Inhale 1 puff into the lungs daily., Disp: 56 each, Rfl: 0 .  ibuprofen (ADVIL) 200 MG tablet, Take 400 mg by mouth every 6 (six) hours as needed., Disp: , Rfl:  .  LORazepam (ATIVAN) 0.5 MG tablet, Take 1 tablet (0.5 mg total) by mouth 2 (two) times daily as needed., Disp: 180 tablet, Rfl: 1 .  losartan (COZAAR) 100 MG tablet, TAKE 1 TABLET DAILY. NEEDS PHYSICAL, Disp: 90 tablet, Rfl: 1 .  naproxen (NAPROSYN) 500 MG tablet, Take 1 tablet (500 mg total) by mouth 2 (two) times daily with a meal., Disp: 14 tablet, Rfl: 0 .  omeprazole (PRILOSEC) 40 MG capsule, Take 1 capsule (40 mg total) by mouth daily., Disp: 90 capsule, Rfl: 3 .  pravastatin (PRAVACHOL) 40 MG tablet, TAKE 1 TABLET DAILY, Disp: 90 tablet, Rfl: 0 .  pravastatin (PRAVACHOL) 40 MG tablet, TAKE 1 TABLET DAILY NEEDS  PHYSICAL, Disp: 90 tablet, Rfl: 1 .  TRELEGY ELLIPTA 100-62.5-25 MCG/INH AEPB, INHALE 1 PUFF INTO LUNGS ONCE DAILY, Disp: 60 each, Rfl: 3

## 2017-10-30 NOTE — Telephone Encounter (Signed)
Pt needs refill on lorazepam #180 [facility] [place] on [address]

## 2017-10-30 NOTE — Telephone Encounter (Signed)
Rx printed

## 2017-10-30 NOTE — Patient Instructions (Addendum)
Fatigue and daytime somnolence: We will arrange a home sleep study and call you with the results  Depression: Continue taking Celexa as you are doing I will talk to your primary care doctor about this I also think that sleep apnea (very likely) may be contributing Try to get out and exercise, this will help quite a bit  Tobacco abuse: Quit smoking cigarettes  COPD: Continue Trelegy 1 puff daily Try to exercise as much as possible  We will see you back in 3 months or sooner if the sleep study is positive

## 2017-10-30 NOTE — Addendum Note (Signed)
Addended by: Len Blalock on: 10/30/2017 11:57 AM   Modules accepted: Orders

## 2017-10-31 NOTE — Telephone Encounter (Signed)
Pt said [facility] does not any lorazepam and he will callback with another pharm name etc

## 2017-10-31 NOTE — Telephone Encounter (Signed)
Rx was re-faxed to [facility]

## 2017-10-31 NOTE — Telephone Encounter (Signed)
Please send lorazepam to [facility] [address] in [place]  phone [phone]

## 2017-11-02 ENCOUNTER — Telehealth: Payer: Self-pay | Admitting: Pulmonary Disease

## 2017-11-02 DIAGNOSIS — G4733 Obstructive sleep apnea (adult) (pediatric): Secondary | ICD-10-CM

## 2017-11-06 NOTE — Telephone Encounter (Signed)
Libby please advise on reason for call.  Thanks!

## 2017-11-06 NOTE — Telephone Encounter (Signed)
LMTCB Sally E Ottinger ° °

## 2017-11-07 NOTE — Addendum Note (Signed)
Addended by: Len Blalock on: 11/07/2017 04:57 PM   Modules accepted: Orders

## 2017-11-07 NOTE — Telephone Encounter (Signed)
Order placed.  Thanks [place]!

## 2017-11-07 NOTE — Telephone Encounter (Signed)
Sorry I accidentally closed this encounter I have spoken to this pt he is aware his bcbs denied his home sleep study however they want him to do a split night study which I have scheduled for 12/01/17 and I need an order put in for this split night thanks

## 2017-12-01 ENCOUNTER — Ambulatory Visit (HOSPITAL_BASED_OUTPATIENT_CLINIC_OR_DEPARTMENT_OTHER): Payer: BC Managed Care – PPO

## 2017-12-28 ENCOUNTER — Encounter: Payer: Self-pay | Admitting: Internal Medicine

## 2017-12-31 ENCOUNTER — Ambulatory Visit (HOSPITAL_BASED_OUTPATIENT_CLINIC_OR_DEPARTMENT_OTHER): Payer: BC Managed Care – PPO | Attending: Pulmonary Disease | Admitting: Pulmonary Disease

## 2017-12-31 VITALS — Ht 68.0 in | Wt 212.0 lb

## 2018-01-01 DIAGNOSIS — G4733 Obstructive sleep apnea (adult) (pediatric): Secondary | ICD-10-CM

## 2018-01-01 DIAGNOSIS — R0683 Snoring: Secondary | ICD-10-CM | POA: Insufficient documentation

## 2018-01-01 NOTE — Procedures (Signed)
    Patient Name: Trevor Mack, Trevor Mack Date: 12/31/2017 Gender: Male D.O.B: Oct 24, 1956 Age (years): 61 Referring Provider: Simonne Maffucci Height (inches): 46 Interpreting Physician: Chesley Mires MD, ABSM Weight (lbs): 212 RPSGT: Madelon Lips BMI: 31 MRN: [id number] Neck Size: 18.50  CLINICAL INFORMATION Sleep Study Type: NPSG  Indication for sleep study: daytime sleepiness, snoring.  SLEEP STUDY TECHNIQUE As per the AASM Manual for the Scoring of Sleep and Associated Events v2.3 (April 2016) with a hypopnea requiring 4% desaturations.  The channels recorded and monitored were frontal, central and occipital EEG, electrooculogram (EOG), submentalis EMG (chin), nasal and oral airflow, thoracic and abdominal wall motion, anterior tibialis EMG, snore microphone, electrocardiogram, and pulse oximetry.  MEDICATIONS Medications self-administered by patient taken the night of the study : N/A  SLEEP ARCHITECTURE The study was initiated at 10:03:55 PM and ended at 2:32:21 AM.  Sleep onset time was 98.4 minutes and the sleep efficiency was 46.9%. The total sleep time was 126.0 minutes.  Stage REM latency was 115.0 minutes.  The patient spent 21.03% of the night in stage N1 sleep, 58.73% in stage N2 sleep, 0.00% in stage N3 and 20.24% in REM.  Alpha intrusion was absent.  Supine sleep was 0.00%.  RESPIRATORY PARAMETERS The overall apnea/hypopnea index (AHI) was 1.0 per hour. There were 2 total apneas, including 1 obstructive, 1 central and 0 mixed apneas. There were 0 hypopneas and 11 RERAs.  The AHI during Stage REM sleep was 0.0 per hour.  AHI while supine was N/A per hour.  The mean oxygen saturation was 93.59%. The minimum SpO2 during sleep was 91.00%.  moderate snoring was noted during this study.  CARDIAC DATA The 2 lead EKG demonstrated sinus rhythm. The mean heart rate was 69.02 beats per minute. Other EKG findings include: None.  LEG MOVEMENT DATA The total PLMS  were 0 with a resulting PLMS index of 0.00. Associated arousal with leg movement index was 0.0 .  IMPRESSIONS - While he had a few respiratory events, these were not frequent enough to meet the diagnosis of obstructive sleep apnea. - His overall AHI was 1 with an SpO2 low of 91%. - Moderate snoring noted.  DIAGNOSIS - Snoring (R06.83).  RECOMMENDATIONS - Avoid alcohol, sedatives and other CNS depressants that may worsen sleep apnea and disrupt normal sleep architecture. - Sleep hygiene should be reviewed to assess factors that may improve sleep quality. - Weight management and regular exercise should be initiated or continued if appropriate.  [Electronically signed] 01/01/2018 08:36 AM  Chesley Mires MD, ABSM Diplomate, American Board of Sleep Medicine NPI: [id number]

## 2018-01-02 NOTE — Progress Notes (Signed)
A, Please let the patient know this was OK Thanks, B

## 2018-01-03 ENCOUNTER — Telehealth: Payer: Self-pay | Admitting: Pulmonary Disease

## 2018-01-03 NOTE — Telephone Encounter (Signed)
A, Please let the patient know this was OK Thanks, B  ---------- lmtcb X1 to make pt aware of sleep study results.

## 2018-01-09 NOTE — Telephone Encounter (Signed)
Pt is aware of results and voiced his understanding. Nothing further is needed.  

## 2018-01-10 ENCOUNTER — Ambulatory Visit: Payer: BC Managed Care – PPO | Admitting: Internal Medicine

## 2018-01-10 ENCOUNTER — Encounter: Payer: Self-pay | Admitting: Internal Medicine

## 2018-01-10 VITALS — BP 148/62 | HR 98 | Temp 98.3°F | Wt 214.0 lb

## 2018-01-10 DIAGNOSIS — E785 Hyperlipidemia, unspecified: Secondary | ICD-10-CM | POA: Diagnosis not present

## 2018-01-10 DIAGNOSIS — I10 Essential (primary) hypertension: Secondary | ICD-10-CM

## 2018-01-10 DIAGNOSIS — Z86010 Personal history of colonic polyps: Secondary | ICD-10-CM | POA: Diagnosis not present

## 2018-01-10 DIAGNOSIS — J441 Chronic obstructive pulmonary disease with (acute) exacerbation: Secondary | ICD-10-CM | POA: Diagnosis not present

## 2018-01-10 DIAGNOSIS — Z720 Tobacco use: Secondary | ICD-10-CM | POA: Diagnosis not present

## 2018-01-10 MED ORDER — TAMSULOSIN HCL 0.4 MG PO CAPS: 0.4000 mg | ORAL_CAPSULE | Freq: Every day | ORAL | 3 refills | Status: DC

## 2018-01-10 MED ORDER — LORAZEPAM 0.5 MG PO TABS: 0.5000 mg | ORAL_TABLET | Freq: Two times a day (BID) | ORAL | 1 refills | Status: DC | PRN

## 2018-01-10 MED ORDER — FLUTICASONE-UMECLIDIN-VILANT 100-62.5-25 MCG/INH IN AEPB: 1.0000 | INHALATION_SPRAY | Freq: Every day | RESPIRATORY_TRACT | 0 refills | Status: DC

## 2018-01-10 NOTE — Progress Notes (Signed)
Subjective:    Patient ID: Trevor Mack, male    DOB: 10/05/1956, 62 y.o.   MRN: [id number]  HPI  62 year old patient who is seen today for follow-up.  He is followed by pulmonary medicine with COPD Gold stage C.  Unforeseen continues to smoke approximately 1-1/2 packs/day.  Clinically he has done well on present medical regimen with less wheezing. Insurance did not approve a low-dose chest CT for lung cancer screening. He has a history of colonic polyps as well as family history of colon cancer and has received a recall letter for follow-up colonoscopy He has had a sleep study performed without significant OSA. He has essential hypertension and dyslipidemia  He has been disabled for 11 months Complaints to include urgency and nocturia x4  Requires disability form completion today   Past Medical History:  Diagnosis Date  . ANXIETY 07/23/2008  . COLONIC POLYPS, HX OF 03/26/2006   MULTIPLE FRAGMENTS OF TUBULAR ADENOMAS POLYPS  . Family history of colon cancer    Father   . GERD 07/23/2008  . HYPERLIPIDEMIA 07/23/2008  . HYPERTENSION 07/23/2008  . TENDINITIS, LEFT THUMB 07/15/2010     Social History   Socioeconomic History  . Marital status: Married    Spouse name: Not on file  . Number of children: 0  . Years of education: Not on file  . Highest education level: Not on file  Social Needs  . Financial resource strain: Not on file  . Food insecurity - worry: Not on file  . Food insecurity - inability: Not on file  . Transportation needs - medical: Not on file  . Transportation needs - non-medical: Not on file  Occupational History    Employer: [employer]  Tobacco Use  . Smoking status: Current Every Day Smoker    Packs/day: 1.50    Years: 35.00    Pack years: 52.50    Types: Cigarettes  . Smokeless tobacco: Never Used  . Tobacco comment: Started smoking at age 62.  Currently smoking 1 1/2 ppd.  Substance and Sexual Activity  . Alcohol use: No  . Drug  use: No  . Sexual activity: Not on file  Other Topics Concern  . Not on file  Social History Narrative  . Not on file    Past Surgical History:  Procedure Laterality Date  . LUMBAR LAMINECTOMY      Family History  Problem Relation Age of Onset  . Colon cancer Father   . Hypertension Father   . Diabetes Father   . Heart attack Father     No Known Allergies  Current Outpatient Medications on File Prior to Visit  Medication Sig Dispense Refill  . citalopram (CELEXA) 40 MG tablet TAKE 1 TABLET DAILY (NEEDS PHYSICAL) 90 tablet 1  . ibuprofen (ADVIL) 200 MG tablet Take 400 mg by mouth every 6 (six) hours as needed.    Marland Kitchen losartan (COZAAR) 100 MG tablet TAKE 1 TABLET DAILY. NEEDS PHYSICAL 90 tablet 1  . naproxen (NAPROSYN) 500 MG tablet Take 1 tablet (500 mg total) by mouth 2 (two) times daily with a meal. 14 tablet 0  . omeprazole (PRILOSEC) 40 MG capsule Take 1 capsule (40 mg total) by mouth daily. 90 capsule 3  . pravastatin (PRAVACHOL) 40 MG tablet TAKE 1 TABLET DAILY NEEDS  PHYSICAL 90 tablet 1  . TRELEGY ELLIPTA 100-62.5-25 MCG/INH AEPB INHALE 1 PUFF INTO LUNGS ONCE DAILY 60 each 3   No current facility-administered medications on file prior  to visit.     BP (!) 148/62 (BP Location: Left Arm, Patient Position: Sitting, Cuff Size: Normal)   Pulse 98   Temp 98.3 F (36.8 C) (Oral)   Wt 214 lb (97.1 kg)   SpO2 97%   BMI 32.54 kg/m     Review of Systems  Constitutional: Negative for appetite change, chills, fatigue and fever.  HENT: Negative for congestion, dental problem, ear pain, hearing loss, sore throat, tinnitus, trouble swallowing and voice change.   Eyes: Negative for pain, discharge and visual disturbance.  Respiratory: Positive for cough, shortness of breath and wheezing. Negative for chest tightness and stridor.   Cardiovascular: Negative for chest pain, palpitations and leg swelling.  Gastrointestinal: Negative for abdominal distention, abdominal pain, blood  in stool, constipation, diarrhea, nausea and vomiting.  Genitourinary: Positive for difficulty urinating, frequency and urgency. Negative for discharge, flank pain, genital sores and hematuria.  Musculoskeletal: Negative for arthralgias, back pain, gait problem, joint swelling, myalgias and neck stiffness.  Skin: Negative for rash.  Neurological: Negative for dizziness, syncope, speech difficulty, weakness, numbness and headaches.  Hematological: Negative for adenopathy. Does not bruise/bleed easily.  Psychiatric/Behavioral: Negative for behavioral problems and dysphoric mood. The patient is not nervous/anxious.        Objective:   Physical Exam  Constitutional: He is oriented to person, place, and time. He appears well-developed.  HENT:  Head: Normocephalic.  Right Ear: External ear normal.  Left Ear: External ear normal.  Eyes: Conjunctivae and EOM are normal.  Neck: Normal range of motion.  Cardiovascular: Normal rate and normal heart sounds.  Pulmonary/Chest: Breath sounds normal.  Abdominal: Bowel sounds are normal.  Musculoskeletal: Normal range of motion. He exhibits no edema or tenderness.  Neurological: He is alert and oriented to person, place, and time.  Psychiatric: He has a normal mood and affect. His behavior is normal.          Assessment & Plan:   COPD Gold stage seen.  Total smoking cessation encouraged History of colonic polyps/family history: CA.  We will schedule CPX in 3 months will encourage colonoscopy again at that time; consider Cologuard testing is still declines full colonoscopy Essential hypertension stable Dyslipidemia continue statin therapy  BPH with obstructive symptoms.  Trial Flomax  Medicines updated CPX 3 months  Nyoka Cowden

## 2018-01-10 NOTE — Patient Instructions (Signed)
Limit your sodium (Salt) intake  Please check your blood pressure on a regular basis.  If it is consistently greater than 150/90, please make an office appointment.  Smoking tobacco is very bad for your health. You should stop smoking immediately.  Return in 3 months for follow-up

## 2018-02-06 ENCOUNTER — Ambulatory Visit: Payer: BC Managed Care – PPO | Admitting: Adult Health

## 2018-02-06 ENCOUNTER — Ambulatory Visit: Payer: BC Managed Care – PPO | Admitting: Pulmonary Disease

## 2018-02-07 ENCOUNTER — Other Ambulatory Visit: Payer: Self-pay | Admitting: Pulmonary Disease

## 2018-02-07 DIAGNOSIS — J441 Chronic obstructive pulmonary disease with (acute) exacerbation: Secondary | ICD-10-CM

## 2018-03-05 ENCOUNTER — Encounter: Payer: Self-pay | Admitting: Pulmonary Disease

## 2018-03-05 ENCOUNTER — Ambulatory Visit: Payer: BC Managed Care – PPO | Admitting: Pulmonary Disease

## 2018-03-05 VITALS — BP 162/90 | HR 112 | Ht 69.0 in | Wt 213.4 lb

## 2018-03-05 DIAGNOSIS — J441 Chronic obstructive pulmonary disease with (acute) exacerbation: Secondary | ICD-10-CM | POA: Diagnosis not present

## 2018-03-05 DIAGNOSIS — Z23 Encounter for immunization: Secondary | ICD-10-CM | POA: Diagnosis not present

## 2018-03-05 DIAGNOSIS — F17210 Nicotine dependence, cigarettes, uncomplicated: Secondary | ICD-10-CM | POA: Diagnosis not present

## 2018-03-05 MED ORDER — FLUTICASONE-UMECLIDIN-VILANT 100-62.5-25 MCG/INH IN AEPB: 1.0000 | INHALATION_SPRAY | Freq: Every day | RESPIRATORY_TRACT | 11 refills | Status: DC

## 2018-03-05 MED ORDER — PNEUMOCOCCAL VAC POLYVALENT 25 MCG/0.5ML IJ INJ: 0.5000 mL | INJECTION | INTRAMUSCULAR | Status: DC

## 2018-03-05 NOTE — Patient Instructions (Signed)
Severe COPD: Quit smoking Continue Trelegy daily Continue to practice good hand hygiene this time a year Pneumovax vaccine today  Tobacco abuse: As we discussed today I think it is really important for you to quit smoking  We will see you back in 4-6 months or sooner if needed

## 2018-03-05 NOTE — Progress Notes (Signed)
Subjective:    Patient ID: Trevor Mack, male    DOB: [birth year], 62 y.o.   MRN: [id number]  Synopsis: Former patient of Dr. Joya Gaskins with COPD.   HPI  Chief Complaint  Patient presents with  . Follow-up    c/o stable "good and bad days"- DOE, prod cough with beige mucus.       Trevor Mack says that he has struggled to see [place] because of our schedule changes.  He says that his breathing is OK.  He is wheezing a lot and he says that the Trelegy has helped.  He had a cold in his sinuses, no flu pneumonia.  He is not working.  He says that the sleep study was "a mess".  He left in the middle of the night of the test and never really got a goo test.  He is still sleepy and tired a lot during the daytime.  He wonders if the sedatives he takes is related.  He has a cough with some beige mucus from time to time.  He is still smoking 1.5 ppd.  He has been trying to quit.  He has used 1800 QUIT NOW and they sent him Chantix but he says it didn't help.  He says he doesn't want to quit.    Past Medical History:  Diagnosis Date  . ANXIETY 07/23/2008  . COLONIC POLYPS, HX OF 03/26/2006   MULTIPLE FRAGMENTS OF TUBULAR ADENOMAS POLYPS  . Family history of colon cancer    Father   . GERD 07/23/2008  . HYPERLIPIDEMIA 07/23/2008  . HYPERTENSION 07/23/2008  . TENDINITIS, LEFT THUMB 07/15/2010     Review of Systems  Constitutional: Positive for fatigue. Negative for chills and fever.  HENT: Negative for rhinorrhea, sinus pressure and sinus pain.   Respiratory: Positive for cough and shortness of breath. Negative for wheezing.   Cardiovascular: Negative for chest pain, palpitations and leg swelling.       Objective:   Physical Exam  Vitals:   03/05/18 1620  BP: (!) 162/90  Pulse: (!) 112  SpO2: 96%  Weight: 213 lb 6.4 oz (96.8 kg)  Height: 5\' 9"  (1.753 m)   RA  Gen: chronically ill appearing HENT: OP clear, TM's clear, neck supple PULM: Wheezing bilaterally B, normal percussion CV: RRR, no  mgr, trace edema GI: BS+, soft, nontender Derm: no cyanosis or rash Psyche: normal mood and affect   Sleep study: 12/2017: AHI normal, O2 mean 93%, lowest 91% he left the test early and says that he only got about 2 hours worth of sleep.  PFT: April 2018: Ratio 61%, FEV1 1.75 L 17% change, 50% predicted, FVC 2.85 L 61% predicted, total lung capacity 7.02 L 103% predicted, residual volume 214% predicted, DLCO 12.29 39% predicted  Records from primary care reviewed, he was asked with a musculoskeletal strain of the left shoulder been seen earlier in October    Assessment & Plan:    Need for prophylactic vaccination against Streptococcus pneumoniae (pneumococcus) - Plan: pneumococcal 23 valent vaccine (PNU-IMMUNE) injection 0.5 mL  Chronic obstructive lung disease with acute on chronic bronchitis and exacerbation gold stage C - Plan: Fluticasone-Umeclidin-Vilant (TRELEGY ELLIPTA) 100-62.5-25 MCG/INH AEPB  Cigarette smoker  Discussion: Trevor Mack continues to have symptoms from his severe COPD which is exacerbated by ongoing tobacco use.  He says at this time he is not willing to quit smoking.  We talked about the importance of this today for 4 minutes.  He says that Trelegy does  help him.  I believe he will quit smoking his chronic bronchitis symptoms would improve.  Plan: Severe COPD: Quit smoking Continue Trelegy daily Continue to practice good hand hygiene this time a year Pneumovax vaccine today  Tobacco abuse: As we discussed today I think it is really important for you to quit smoking  We will see you back in 4-6 months or sooner if needed    Current Outpatient Medications:  .  citalopram (CELEXA) 40 MG tablet, TAKE 1 TABLET DAILY (NEEDS PHYSICAL), Disp: 90 tablet, Rfl: 1 .  Fluticasone-Umeclidin-Vilant (TRELEGY ELLIPTA) 100-62.5-25 MCG/INH AEPB, Inhale 1 puff into the lungs daily., Disp: 60 each, Rfl: 11 .  ibuprofen (ADVIL) 200 MG tablet, Take 400 mg by mouth every 6 (six)  hours as needed., Disp: , Rfl:  .  LORazepam (ATIVAN) 0.5 MG tablet, Take 1 tablet (0.5 mg total) by mouth 2 (two) times daily as needed., Disp: 90 tablet, Rfl: 1 .  losartan (COZAAR) 100 MG tablet, TAKE 1 TABLET DAILY. NEEDS PHYSICAL, Disp: 90 tablet, Rfl: 1 .  naproxen (NAPROSYN) 500 MG tablet, Take 1 tablet (500 mg total) by mouth 2 (two) times daily with a meal., Disp: 14 tablet, Rfl: 0 .  omeprazole (PRILOSEC) 40 MG capsule, Take 1 capsule (40 mg total) by mouth daily., Disp: 90 capsule, Rfl: 3 .  pravastatin (PRAVACHOL) 40 MG tablet, TAKE 1 TABLET DAILY NEEDS  PHYSICAL, Disp: 90 tablet, Rfl: 1 .  tamsulosin (FLOMAX) 0.4 MG CAPS capsule, Take 1 capsule (0.4 mg total) by mouth daily., Disp: 30 capsule, Rfl: 3  Current Facility-Administered Medications:  .  [START ON 03/06/2018] pneumococcal 23 valent vaccine (PNU-IMMUNE) injection 0.5 mL, 0.5 mL, Intramuscular, Tomorrow-1000, Matson Welch, Ronie Spies, MD

## 2018-04-11 ENCOUNTER — Other Ambulatory Visit: Payer: Self-pay

## 2018-04-11 MED ORDER — LOSARTAN POTASSIUM 100 MG PO TABS: ORAL_TABLET | ORAL | 1 refills | Status: DC

## 2018-04-11 MED ORDER — CITALOPRAM HYDROBROMIDE 40 MG PO TABS: ORAL_TABLET | ORAL | 0 refills | Status: DC

## 2018-04-11 MED ORDER — PRAVASTATIN SODIUM 40 MG PO TABS: ORAL_TABLET | ORAL | 1 refills | Status: DC

## 2018-04-17 ENCOUNTER — Encounter: Payer: Self-pay | Admitting: Internal Medicine

## 2018-04-17 ENCOUNTER — Ambulatory Visit (INDEPENDENT_AMBULATORY_CARE_PROVIDER_SITE_OTHER): Payer: BC Managed Care – PPO | Admitting: Internal Medicine

## 2018-04-17 VITALS — BP 160/70 | HR 77 | Temp 98.5°F | Ht 70.0 in | Wt 215.0 lb

## 2018-04-17 DIAGNOSIS — Z0000 Encounter for general adult medical examination without abnormal findings: Secondary | ICD-10-CM

## 2018-04-17 DIAGNOSIS — Z125 Encounter for screening for malignant neoplasm of prostate: Secondary | ICD-10-CM | POA: Diagnosis not present

## 2018-04-17 DIAGNOSIS — K732 Chronic active hepatitis, not elsewhere classified: Secondary | ICD-10-CM

## 2018-04-17 LAB — PSA: PSA: 2.3 ng/mL (ref 0.10–4.00)

## 2018-04-17 LAB — CBC WITH DIFFERENTIAL/PLATELET
BASOS ABS: 0.1 10*3/uL (ref 0.0–0.1)
Basophils Relative: 0.8 % (ref 0.0–3.0)
EOS PCT: 1.4 % (ref 0.0–5.0)
Eosinophils Absolute: 0.1 10*3/uL (ref 0.0–0.7)
HCT: 47.3 % (ref 39.0–52.0)
Hemoglobin: 16.2 g/dL (ref 13.0–17.0)
Lymphocytes Relative: 19.7 % (ref 12.0–46.0)
Lymphs Abs: 2 10*3/uL (ref 0.7–4.0)
MCHC: 34.3 g/dL (ref 30.0–36.0)
MCV: 83 fl (ref 78.0–100.0)
MONO ABS: 0.6 10*3/uL (ref 0.1–1.0)
MONOS PCT: 6.3 % (ref 3.0–12.0)
NEUTROS ABS: 7.3 10*3/uL (ref 1.4–7.7)
NEUTROS PCT: 71.8 % (ref 43.0–77.0)
Platelets: 169 10*3/uL (ref 150.0–400.0)
RBC: 5.7 Mil/uL (ref 4.22–5.81)
RDW: 14.7 % (ref 11.5–15.5)
WBC: 10.1 10*3/uL (ref 4.0–10.5)

## 2018-04-17 LAB — LIPID PANEL
CHOLESTEROL: 171 mg/dL (ref 0–200)
HDL: 31.5 mg/dL — ABNORMAL LOW (ref >39.00)
LDL Cholesterol: 105 mg/dL — ABNORMAL HIGH (ref 0–99)
NONHDL: 139.78
Total CHOL/HDL Ratio: 5
Triglycerides: 172 mg/dL — ABNORMAL HIGH (ref 0.0–149.0)
VLDL: 34.4 mg/dL (ref 0.0–40.0)

## 2018-04-17 LAB — COMPREHENSIVE METABOLIC PANEL
ALBUMIN: 4.4 g/dL (ref 3.5–5.2)
ALK PHOS: 109 U/L (ref 39–117)
ALT: 28 U/L (ref 0–53)
AST: 20 U/L (ref 0–37)
BUN: 12 mg/dL (ref 6–23)
CO2: 28 mEq/L (ref 19–32)
CREATININE: 0.96 mg/dL (ref 0.40–1.50)
Calcium: 9 mg/dL (ref 8.4–10.5)
Chloride: 102 mEq/L (ref 96–112)
GFR: 84.64 mL/min (ref >60.00)
GLUCOSE: 89 mg/dL (ref 70–99)
Potassium: 3.9 mEq/L (ref 3.5–5.1)
SODIUM: 138 meq/L (ref 135–145)
TOTAL PROTEIN: 7.3 g/dL (ref 6.0–8.3)
Total Bilirubin: 0.7 mg/dL (ref 0.2–1.2)

## 2018-04-17 LAB — TSH: TSH: 2.4 u[IU]/mL (ref 0.35–4.50)

## 2018-04-17 MED ORDER — TAMSULOSIN HCL 0.4 MG PO CAPS: 0.4000 mg | ORAL_CAPSULE | Freq: Every day | ORAL | 3 refills | Status: DC

## 2018-04-17 NOTE — Patient Instructions (Signed)
Limit your sodium (Salt) intake  Please check your blood pressure on a regular basis.  If it is consistently greater than 150/90, please make an office appointment.    It is important that you exercise regularly, at least 20 minutes 3 to 4 times per week.  If you develop chest pain or shortness of breath seek  medical attention.  Smoking tobacco is very bad for your health. You should stop smoking immediately.  Schedule your colonoscopy to help detect colon cancer.     

## 2018-04-17 NOTE — Addendum Note (Signed)
Addended by: Bluford Kaufmann F on: 04/17/2018 08:40 AM   Modules accepted: Orders

## 2018-04-17 NOTE — Progress Notes (Signed)
Subjective:    Patient ID: Trevor Mack, male    DOB: 1956-06-23, 62 y.o.   MRN: [id number]  HPI  62 year old patient who is seen today for a preventive health examination  He is followed by pulmonary medicine with a history of COPD. Continues to smoke about 1.5 packs/day.  He has been disabled for about 12 months.  He states he can walk 100 feet on a level surface only before he develops DOE He has a personal history of polyps and a family history of colon cancer He is agreeable to follow-up colonoscopy today He has essential hypertension and dyslipidemia. Family history Father died at 14 history of colon cancer In his 5s died of an acute MI.  Mother died at 81 2 brothers and 2 sisters one sister with asthma  Past Medical History:  Diagnosis Date  . ANXIETY 07/23/2008  . COLONIC POLYPS, HX OF 03/26/2006   MULTIPLE FRAGMENTS OF TUBULAR ADENOMAS POLYPS  . Family history of colon cancer    Father   . GERD 07/23/2008  . HYPERLIPIDEMIA 07/23/2008  . HYPERTENSION 07/23/2008  . TENDINITIS, LEFT THUMB 07/15/2010     Social History   Socioeconomic History  . Marital status: Married    Spouse name: Not on file  . Number of children: 0  . Years of education: Not on file  . Highest education level: Not on file  Occupational History    Employer: [employer]  Social Needs  . Financial resource strain: Not on file  . Food insecurity:    Worry: Not on file    Inability: Not on file  . Transportation needs:    Medical: Not on file    Non-medical: Not on file  Tobacco Use  . Smoking status: Current Every Day Smoker    Packs/day: 1.50    Years: 35.00    Pack years: 52.50    Types: Cigarettes  . Smokeless tobacco: Never Used  . Tobacco comment: Started smoking at age 86.  Currently smoking 1 1/2 ppd.  Substance and Sexual Activity  . Alcohol use: No  . Drug use: No  . Sexual activity: Not on file  Lifestyle  . Physical activity:    Days per week: Not on file   Minutes per session: Not on file  . Stress: Not on file  Relationships  . Social connections:    Talks on phone: Not on file    Gets together: Not on file    Attends religious service: Not on file    Active member of club or organization: Not on file    Attends meetings of clubs or organizations: Not on file    Relationship status: Not on file  . Intimate partner violence:    Fear of current or ex partner: Not on file    Emotionally abused: Not on file    Physically abused: Not on file    Forced sexual activity: Not on file  Other Topics Concern  . Not on file  Social History Narrative  . Not on file    Past Surgical History:  Procedure Laterality Date  . LUMBAR LAMINECTOMY      Family History  Problem Relation Age of Onset  . Colon cancer Father   . Hypertension Father   . Diabetes Father   . Heart attack Father     No Known Allergies  Current Outpatient Medications on File Prior to Visit  Medication Sig Dispense Refill  . albuterol (PROVENTIL HFA;VENTOLIN HFA)  108 (90 Base) MCG/ACT inhaler     . citalopram (CELEXA) 40 MG tablet TAKE 1 TABLET DAILY 90 tablet 0  . Fluticasone-Umeclidin-Vilant (TRELEGY ELLIPTA) 100-62.5-25 MCG/INH AEPB Inhale 1 puff into the lungs daily. 60 each 11  . ibuprofen (ADVIL) 200 MG tablet Take 400 mg by mouth every 6 (six) hours as needed.    Marland Kitchen LORazepam (ATIVAN) 0.5 MG tablet Take 1 tablet (0.5 mg total) by mouth 2 (two) times daily as needed. 90 tablet 1  . losartan (COZAAR) 100 MG tablet TAKE 1 TABLET DAILY. NEEDS PHYSICAL 90 tablet 1  . naproxen (NAPROSYN) 500 MG tablet Take 1 tablet (500 mg total) by mouth 2 (two) times daily with a meal. 14 tablet 0  . omeprazole (PRILOSEC) 40 MG capsule Take 1 capsule (40 mg total) by mouth daily. 90 capsule 3  . pravastatin (PRAVACHOL) 40 MG tablet TAKE 1 TABLET DAILY 90 tablet 1  . tamsulosin (FLOMAX) 0.4 MG CAPS capsule Take 1 capsule (0.4 mg total) by mouth daily. 30 capsule 3   No current  facility-administered medications on file prior to visit.     BP (!) 160/70 (BP Location: Right Arm, Patient Position: Sitting, Cuff Size: Large)   Pulse 77   Temp 98.5 F (36.9 C) (Oral)   Ht 5\' 10"  (1.778 m)   Wt 215 lb (97.5 kg)   SpO2 97%   BMI 30.85 kg/m     Review of Systems  Constitutional: Negative for appetite change, chills, fatigue and fever.  HENT: Negative for congestion, dental problem, ear pain, hearing loss, sore throat, tinnitus, trouble swallowing and voice change.   Eyes: Negative for pain, discharge and visual disturbance.  Respiratory: Positive for shortness of breath. Negative for cough, chest tightness, wheezing and stridor.   Cardiovascular: Negative for chest pain, palpitations and leg swelling.  Gastrointestinal: Negative for abdominal distention, abdominal pain, blood in stool, constipation, diarrhea, nausea and vomiting.  Genitourinary: Negative for difficulty urinating, discharge, flank pain, genital sores, hematuria and urgency.  Musculoskeletal: Negative for arthralgias, back pain, gait problem, joint swelling, myalgias and neck stiffness.  Skin: Negative for rash.  Neurological: Negative for dizziness, syncope, speech difficulty, weakness, numbness and headaches.  Hematological: Negative for adenopathy. Does not bruise/bleed easily.  Psychiatric/Behavioral: Negative for behavioral problems and dysphoric mood. The patient is not nervous/anxious.        Objective:   Physical Exam  Constitutional: He appears well-developed and well-nourished. No distress.  Blood pressure elevated on arrival but as low as 122/78 later in the exam  HENT:  Head: Normocephalic and atraumatic.  Right Ear: External ear normal.  Left Ear: External ear normal.  Nose: Nose normal.  Mouth/Throat: Oropharynx is clear and moist.  Pharyngeal crowding Edentulous  Eyes: Pupils are equal, round, and reactive to light. Conjunctivae and EOM are normal. No scleral icterus.  Neck:  Normal range of motion. Neck supple. No JVD present. No thyromegaly present.  Cardiovascular: Regular rhythm, normal heart sounds and intact distal pulses. Exam reveals no gallop and no friction rub.  No murmur heard. Decreased right posterior tibial pulse  Pulmonary/Chest: Effort normal. He exhibits no tenderness.  Scattered faint rhonchi no active wheezing  Abdominal: Soft. Bowel sounds are normal. He exhibits no distension and no mass. There is no tenderness.  Genitourinary: Prostate normal and penis normal. Rectal exam shows guaiac negative stool.  Genitourinary Comments: Right testicular enlargement consistent with hydrocele  Musculoskeletal: Normal range of motion. He exhibits no edema or tenderness.  Lymphadenopathy:  He has no cervical adenopathy.  Neurological: He is alert. He has normal reflexes. No cranial nerve deficit. Coordination normal.  Decreased vibratory sensation distally  Skin: Skin is warm and dry. No rash noted.  Psychiatric: He has a normal mood and affect. His behavior is normal.          Assessment & Plan:   Preventive health examination.  Patient has had recent Pneumovax. Personal history of colonic polyps and family history of colon cancer.  Will schedule follow-up colonoscopy Essential hypertension Dyslipidemia continue statin therapy Ongoing tobacco use.  Total smoking cessation encouraged  Pulmonary follow-up as scheduled Follow-up 6 months or as needed Check updated lab  [facility]

## 2018-04-18 LAB — HEPATITIS C ANTIBODY
Hepatitis C Ab: REACTIVE — AB
SIGNAL TO CUT-OFF: 1.36 — ABNORMAL HIGH (ref <1.00)

## 2018-04-20 LAB — HCV RNA,QUANTITATIVE REAL TIME PCR
HCV QUANT LOG: 1.92 {Log_IU}/mL — AB
HCV RNA, PCR, QN: 84 [IU]/mL — AB

## 2018-04-23 NOTE — Addendum Note (Signed)
Addended by: Gwenyth Ober R on: 04/23/2018 08:18 AM   Modules accepted: Orders

## 2018-04-30 ENCOUNTER — Telehealth: Payer: Self-pay | Admitting: Internal Medicine

## 2018-05-02 ENCOUNTER — Other Ambulatory Visit: Payer: Self-pay | Admitting: Internal Medicine

## 2018-05-06 ENCOUNTER — Telehealth: Payer: Self-pay | Admitting: Internal Medicine

## 2018-05-06 NOTE — Telephone Encounter (Addendum)
Pt is calling about this refill - he is out of meds.   [facility] on [address]  [phone] Sending as high since it has been more than 72 hrs

## 2018-05-06 NOTE — Telephone Encounter (Signed)
I returned his call and let him know it was refilled on 05/02/18 and should be at his pharmacy [facility] on [facility] [address] [facility] [address] [facility].   Call us back if there is a problem.

## 2018-05-06 NOTE — Telephone Encounter (Signed)
I called and left a voicemail that his Rx for the Ativan went to the [facility] on [address]. In [facility] and to check with them.  It did not go to the [facility].

## 2018-05-09 ENCOUNTER — Encounter: Payer: Self-pay | Admitting: Family

## 2018-05-09 ENCOUNTER — Ambulatory Visit: Payer: BC Managed Care – PPO | Admitting: Family

## 2018-05-09 VITALS — BP 132/80 | HR 122 | Temp 98.2°F | Resp 20 | Ht 69.0 in | Wt 215.8 lb

## 2018-05-09 DIAGNOSIS — B182 Chronic viral hepatitis C: Secondary | ICD-10-CM | POA: Diagnosis not present

## 2018-05-09 NOTE — Patient Instructions (Signed)
Nice to meet you.  We will schedule an ultrasound for your liver and abdomen.  We will check your blood work today.  Do not share razors or toothbrushes.   Avoid acetaminophen (Tylenol) and alcohol.   Plan to follow up in 2 weeks with [name], NP.

## 2018-05-09 NOTE — Assessment & Plan Note (Signed)
Trevor Mack is newly diagnosed with chronic hepatitis C without hepatic coma which was found during his most recent annual physical.  He has a viral load of 84 with undetermined genotype.  No major identifiable risk factors noted.  He is currently asymptomatic.  Discussed importance discussed methods of transmission and treatments.  Plan to obtain hepatitis C genotype, fibro-sure, ultrasound of the abdomen with elastography, and hepatitis B status.  His Pneumovax is up-to-date.  Plan to treat with either Epclusa or Harvoni depending upon genotype and elastography results.  He will follow up in 2 weeks or sooner if needed.

## 2018-05-09 NOTE — Progress Notes (Signed)
Subjective:    Patient ID: Trevor Mack, male    DOB: [birth year], 62 y.o.   MRN: [id number]  Chief Complaint  Patient presents with  . Establish Care     HPI:  Trevor Mack is a 62 y.o. male who presents today for initial evaluation and treatment of Hepatitis C.   Trevor Mack was initially diagnosed with Hepatitis C during a routine physical exam with blood work. He has never been treated for Hepatitis C. He has risk factors for Hepatitis C including surgery 20 years ago which he is unsure if there was an associated blood transfusion. He denies IVDU, tattoos, sharing razors/toothbruses, or known partners with Hepatitis C. No personal or family history of liver disease. Does currently have lower abdominal pain but denies any liver related symptoms. He does not currently take Tylenol or consume alcohol. Pneumovax is up to date. Unsure of Hepatitis B status or receipt of the Hepatitis B vaccination series. Expresses anxiety regarding his diagnosis.   No Known Allergies   Outpatient Medications Prior to Visit  Medication Sig Dispense Refill  . albuterol (PROVENTIL HFA;VENTOLIN HFA) 108 (90 Base) MCG/ACT inhaler     . citalopram (CELEXA) 40 MG tablet TAKE 1 TABLET DAILY 90 tablet 0  . Fluticasone-Umeclidin-Vilant (TRELEGY ELLIPTA) 100-62.5-25 MCG/INH AEPB Inhale 1 puff into the lungs daily. 60 each 11  . ibuprofen (ADVIL) 200 MG tablet Take 400 mg by mouth every 6 (six) hours as needed.    Marland Kitchen LORazepam (ATIVAN) 0.5 MG tablet TAKE 1 TABLET BY MOUTH TWICE DAILY AS NEEDED 180 tablet 0  . losartan (COZAAR) 100 MG tablet TAKE 1 TABLET DAILY. NEEDS PHYSICAL 90 tablet 1  . omeprazole (PRILOSEC) 40 MG capsule Take 1 capsule (40 mg total) by mouth daily. 90 capsule 3  . pravastatin (PRAVACHOL) 40 MG tablet TAKE 1 TABLET DAILY 90 tablet 1  . tamsulosin (FLOMAX) 0.4 MG CAPS capsule Take 1 capsule (0.4 mg total) by mouth daily. 90 capsule 3  . naproxen (NAPROSYN) 500 MG tablet Take 1 tablet (500  mg total) by mouth 2 (two) times daily with a meal. (Patient not taking: Reported on 05/09/2018) 14 tablet 0   No facility-administered medications prior to visit.      Past Medical History:  Diagnosis Date  . ANXIETY 07/23/2008  . COLONIC POLYPS, HX OF 03/26/2006   MULTIPLE FRAGMENTS OF TUBULAR ADENOMAS POLYPS  . Family history of colon cancer    Father   . GERD 07/23/2008  . HYPERLIPIDEMIA 07/23/2008  . HYPERTENSION 07/23/2008  . TENDINITIS, LEFT THUMB 07/15/2010     Past Surgical History:  Procedure Laterality Date  . LUMBAR LAMINECTOMY       Review of Systems  Constitutional: Negative for activity change, chills, diaphoresis, fatigue, fever and unexpected weight change.  Respiratory: Positive for wheezing. Negative for cough, chest tightness and shortness of breath.   Cardiovascular: Negative for chest pain and leg swelling.  Gastrointestinal: Negative for abdominal distention, abdominal pain, blood in stool, constipation, diarrhea, nausea and vomiting.  Neurological: Negative for weakness and headaches.  Hematological: Does not bruise/bleed easily.      Objective:    BP 132/80 (BP Location: Right Arm, Patient Position: Sitting, Cuff Size: Normal)   Pulse (!) 122   Temp 98.2 F (36.8 C) (Oral)   Resp 20   Ht 5\' 9"  (1.753 m)   Wt 215 lb 12.8 oz (97.9 kg)   SpO2 98%   BMI 31.87 kg/m  Nursing note and  vital signs reviewed.  Physical Exam  Constitutional: He is oriented to person, place, and time. He appears well-developed. No distress.  Cardiovascular: Regular rhythm, normal heart sounds and intact distal pulses. Tachycardia present. Exam reveals no gallop and no friction rub.  No murmur heard. Pulmonary/Chest: Effort normal and breath sounds normal. No respiratory distress. He has no wheezes. He has no rales. He exhibits no tenderness.  Abdominal: Soft. Bowel sounds are normal. He exhibits no distension, no ascites and no mass. There is no hepatosplenomegaly. There is  no tenderness. There is no rebound and no guarding.  Neurological: He is alert and oriented to person, place, and time.  Skin: Skin is warm and dry.  Psychiatric: His speech is normal and behavior is normal. His mood appears anxious.       Assessment & Plan:   Problem List Items Addressed This Visit      Digestive   Chronic hepatitis C with hepatic coma ([place]) - Primary    Trevor Mack is newly diagnosed with chronic hepatitis C without hepatic coma which was found during his most recent annual physical.  He has a viral load of 84 with undetermined genotype.  No major identifiable risk factors noted.  He is currently asymptomatic.  Discussed importance discussed methods of transmission and treatments.  Plan to obtain hepatitis C genotype, fibro-sure, ultrasound of the abdomen with elastography, and hepatitis B status.  His Pneumovax is up-to-date.  Plan to treat with either Epclusa or Harvoni depending upon genotype and elastography results.  He will follow up in 2 weeks or sooner if needed.           I am having Trevor Mack maintain his omeprazole, ibuprofen, naproxen, Fluticasone-Umeclidin-Vilant, citalopram, pravastatin, losartan, albuterol, tamsulosin, and LORazepam.   Follow-up: Return in about 2 weeks (around 05/23/2018), or if symptoms worsen or fail to improve.   Trevor Piedra, MSN, [facility]

## 2018-05-09 NOTE — Progress Notes (Signed)
Got it. Thank you.

## 2018-05-10 LAB — HEPATITIS A ANTIBODY, TOTAL: Hepatitis A AB,Total: NONREACTIVE

## 2018-05-10 LAB — PROTIME-INR
INR: 1
PROTHROMBIN TIME: 10.5 s (ref 9.0–11.5)

## 2018-05-10 LAB — HEPATITIS B SURFACE ANTIBODY,QUALITATIVE: HEP B S AB: NONREACTIVE

## 2018-05-10 LAB — HEPATITIS B SURFACE ANTIGEN: Hepatitis B Surface Ag: NONREACTIVE

## 2018-05-14 ENCOUNTER — Ambulatory Visit (HOSPITAL_COMMUNITY)
Admission: RE | Admit: 2018-05-14 | Discharge: 2018-05-14 | Disposition: A | Discharge status: Home / Self Care | Payer: BC Managed Care – PPO | Source: Ambulatory Visit | Attending: Family | Admitting: Family

## 2018-05-14 DIAGNOSIS — B182 Chronic viral hepatitis C: Secondary | ICD-10-CM | POA: Diagnosis present

## 2018-05-14 DIAGNOSIS — K829 Disease of gallbladder, unspecified: Secondary | ICD-10-CM | POA: Diagnosis not present

## 2018-05-14 LAB — HEPATITIS C GENOTYPE: HCV Genotype: NOT DETECTED

## 2018-05-15 ENCOUNTER — Telehealth: Payer: Self-pay | Admitting: Behavioral Health

## 2018-05-15 ENCOUNTER — Other Ambulatory Visit: Payer: Self-pay | Admitting: Family

## 2018-05-15 DIAGNOSIS — B182 Chronic viral hepatitis C: Secondary | ICD-10-CM

## 2018-05-15 NOTE — Telephone Encounter (Signed)
-----   Message from [place], [place] sent at 05/15/2018 12:41 PM EDT ----- Please inform patient that I have received his lab work and I think there is a possibility that he may not need treatment. I would like him to stop by the lab at his convenience to repeat the viral load which I have placed to the orders for him to complete at his convenience.

## 2018-05-15 NOTE — Telephone Encounter (Signed)
Called patient to schedule a lab appointment however patient states he has an appointment with [name] NP 05/23/2018.  He states he can only come that day and would like to have lab work an office visit the same day.  Writer explained to patient there is a possibility he may not need treatment.  Patient verbalized understanding and still states he is coming on 05/23/2018. Pricilla Riffle RN

## 2018-05-17 LAB — LIVER FIBROSIS, FIBROTEST-ACTITEST
ALT: 28 U/L (ref 9–46)
APOLIPOPROTEIN A1: 121 mg/dL
Alpha-2-Macroglobulin: 237 mg/dL (ref 106–279)
Bilirubin: 0.2 mg/dL (ref 0.2–1.2)
Fibrosis Score: 0.25
GGT: 32 U/L (ref 3–70)
HAPTOGLOBIN: 157 mg/dL (ref 43–212)
Necroinflammat ACT Score: 0.12
REFERENCE ID: 2476982

## 2018-05-20 ENCOUNTER — Other Ambulatory Visit: Payer: Self-pay | Admitting: Internal Medicine

## 2018-05-23 ENCOUNTER — Encounter: Payer: Self-pay | Admitting: Infectious Diseases

## 2018-05-23 ENCOUNTER — Ambulatory Visit: Payer: BC Managed Care – PPO | Admitting: Infectious Diseases

## 2018-05-23 VITALS — BP 158/84 | HR 91 | Temp 97.8°F | Ht 70.0 in | Wt 215.8 lb

## 2018-05-23 DIAGNOSIS — B182 Chronic viral hepatitis C: Secondary | ICD-10-CM

## 2018-05-23 DIAGNOSIS — Z23 Encounter for immunization: Secondary | ICD-10-CM

## 2018-05-23 NOTE — Assessment & Plan Note (Addendum)
Hep C - RNA 84 03/2018, unable to generate genotype F2-3 - [place] with hyperechoic hepatic parenchyma with mild nodular contour. No focal lesion seen. Treatment naive.   We discussed all aspects of his results today. He has a new patient visit with Dr. Hilarie Fredrickson coming up in July - with the nodularity observed on U/S would like for him to also discuss ultrasound results and necessity of follow up screening/monitoring. Synthetic liver function intact.   Will plan to repeat his Hep C viral load today and genotype to get an idea of trajectory of level. Unlikely he will clear spontaneously after such a long time of possible infection (20 years) however it is interesting it is so low.   Pneumovax up to date. Will give Hep B #1 and Hep A #1 today. Hep B #2 in 1 month at return visit. We will call him with results to discuss. Will likely use Epclusa 12w--> counseled today on necessity to reduce omeprazole to 20 mg daily during treatment and proper adherence/timing of medications.   Welcomed and answered all questions today.

## 2018-05-23 NOTE — Progress Notes (Signed)
Hep A

## 2018-05-23 NOTE — Patient Instructions (Addendum)
We gave you your first vaccines for hepatitis A and B.   We will need to give you your second Hep B dose in 1 month.   Going to repeat some blood work for you today and call you with the results as to if we need to start your medication. We will schedule follow up at that phone call.

## 2018-05-23 NOTE — Progress Notes (Signed)
[facility]   CC: review of labs for Hep C  HPI: Trevor Mack is a 62 y.o. male who is here today to review labs for consideration of management of his hepatitis C infection. Interestingly he had a very low level of hep c RNA in April @ 84. Unable to generate genotype off level. His risk factor for hep c apperas to have been through possible blood transfusion 20 years ago with surgery. He is still uncertain as to what hepatitis C means.   Patient does not have documented immunity to Hepatitis A. Patient does not have documented immunity to Hepatitis B.     Past Medical History:  Diagnosis Date  . ANXIETY 07/23/2008  . COLONIC POLYPS, HX OF 03/26/2006   MULTIPLE FRAGMENTS OF TUBULAR ADENOMAS POLYPS  . Family history of colon cancer    Father   . GERD 07/23/2008  . HYPERLIPIDEMIA 07/23/2008  . HYPERTENSION 07/23/2008  . TENDINITIS, LEFT THUMB 07/15/2010    Outpatient Medications Prior to Visit  Medication Sig Dispense Refill  . albuterol (PROVENTIL HFA;VENTOLIN HFA) 108 (90 Base) MCG/ACT inhaler     . citalopram (CELEXA) 40 MG tablet TAKE 1 TABLET DAILY 90 tablet 0  . Fluticasone-Umeclidin-Vilant (TRELEGY ELLIPTA) 100-62.5-25 MCG/INH AEPB Inhale 1 puff into the lungs daily. 60 each 11  . ibuprofen (ADVIL) 200 MG tablet Take 400 mg by mouth every 6 (six) hours as needed.    Marland Kitchen LORazepam (ATIVAN) 0.5 MG tablet TAKE 1 TABLET BY MOUTH TWICE DAILY AS NEEDED 180 tablet 0  . losartan (COZAAR) 100 MG tablet TAKE 1 TABLET DAILY. NEEDS PHYSICAL 90 tablet 1  . naproxen (NAPROSYN) 500 MG tablet Take 1 tablet (500 mg total) by mouth 2 (two) times daily with a meal. (Patient not taking: Reported on 05/09/2018) 14 tablet 0  . omeprazole (PRILOSEC) 40 MG capsule Take 1 capsule (40 mg total) by mouth daily. 90 capsule 3  . pravastatin (PRAVACHOL) 40 MG tablet TAKE 1 TABLET DAILY. YOU   NEED A PHYSICAL 90 tablet 1  . tamsulosin (FLOMAX) 0.4 MG CAPS capsule Take 1 capsule (0.4  mg total) by mouth daily. 90 capsule 3   No facility-administered medications prior to visit.     No Known Allergies  Social History   Tobacco Use  . Smoking status: Current Every Day Smoker    Packs/day: 1.50    Years: 35.00    Pack years: 52.50    Types: Cigarettes  . Smokeless tobacco: Never Used  . Tobacco comment: Started smoking at age 65.  Currently smoking 1 1/2 ppd.  Substance Use Topics  . Alcohol use: No  . Drug use: No    Family History  Problem Relation Age of Onset  . Colon cancer Father   . Hypertension Father   . Diabetes Father   . Heart attack Father      Objective:  Constitutional: in no apparent distress, well developed and well nourished, alert and oriented times 3,  Vitals:   05/23/18 0911  BP: (!) 158/84  Pulse: 91  Temp: 97.8 F (36.6 C)   Eyes: anicteric Cardiovascular: Cor RRR and No murmurs Respiratory: wheezing bilaterally Gastrointestinal: Rounded abdomen with umbilical hernia in place - this is reducible and w/o pain. Bowel sounds are normal, liver is not enlarged, spleen is not enlarged however difficult to palpate with body habitus. No shifting dullness or signs of ascites.  Musculoskeletal: peripheral pulses normal, no pedal edema, no clubbing or  cyanosis Skin: negative for - jaundice, spider hemangioma, telangiectasia, palmar erythema, ecchymosis and atrophy; no porphyria cutanea tarda. Yellowing of fingers d/t cigarette smoking.  Lymphatic: no cervical lymphadenopathy  Laboratory Genotype:  Lab Results  Component Value Date   HCVGENOTYPE Not Detected 05/09/2018   HCV viral load: No results found for: HCVQUANT Lab Results  Component Value Date   WBC 10.1 04/17/2018   HGB 16.2 04/17/2018   HCT 47.3 04/17/2018   MCV 83.0 04/17/2018   PLT 169.0 04/17/2018    Lab Results  Component Value Date   CREATININE 0.96 04/17/2018   BUN 12 04/17/2018   NA 138 04/17/2018   K 3.9 04/17/2018   CL 102 04/17/2018   CO2 28 04/17/2018      Lab Results  Component Value Date   ALT 28 05/09/2018   AST 20 04/17/2018   ALKPHOS 109 04/17/2018    Lab Results  Component Value Date   INR 1.0 05/09/2018   BILITOT 0.7 04/17/2018   ALBUMIN 4.4 04/17/2018    Labs and history reviewed and show CHILD-PUGH A  5-6 points: Child class A 7-9 points: Child class B 10-15 points: Child class C  Assessment & Plan:   Problem List Items Addressed This Visit      Digestive   Chronic viral hepatitis C ([place]) - Primary    Hep C - RNA 84 03/2018, unable to generate genotype F2-3 - [place] with hyperechoic hepatic parenchyma with mild nodular contour. No focal lesion seen. Treatment naive.   We discussed all aspects of his results today. He has a new patient visit with Dr. Hilarie Mack coming up in July - with the nodularity observed on U/S would like for him to also discuss ultrasound results and necessity of follow up screening/monitoring. Synthetic liver function intact.   Will plan to repeat his Hep C viral load today and genotype to get an idea of trajectory of level. Unlikely he will clear spontaneously after such a long time of possible infection (20 years) however it is interesting it is so low.   Pneumovax up to date. Will give Hep B #1 and Hep A #1 today. Hep B #2 in 1 month at return visit. We will call him with results to discuss. Will likely use Epclusa 12w--> counseled today on necessity to reduce omeprazole to 20 mg daily during treatment and proper adherence/timing of medications.   Welcomed and answered all questions today.       Relevant Orders   Hepatitis C RNA quantitative   Hepatitis C genotype     Trevor Madeira, MSN, NP-C [facility] [place] Office: [phone] Pager: [phone]  05/23/18 9:58 AM

## 2018-05-23 NOTE — Progress Notes (Signed)
HPI: Trevor Mack is a 62 y.o. male who presents to the [facility] to follow-up with Trevor Mack for his Hep C infection.   Patient Active Problem List   Diagnosis Date Noted  . Chronic viral hepatitis C ([place]) 05/09/2018  . Snoring 01/01/2018  . Tobacco use 12/11/2012  . Chronic obstructive lung disease with acute on chronic bronchitis and exacerbation gold stage C 12/10/2012  . Family history of colon cancer 11/12/2012  . Neck pain, acute 05/10/2011  . TENDINITIS, LEFT THUMB 07/15/2010  . Dyslipidemia 07/23/2008  . ANXIETY 07/23/2008  . Essential hypertension 07/23/2008  . GERD 07/23/2008  . History of colonic polyps 07/23/2008    Patient's Medications  New Prescriptions   No medications on file  Previous Medications   ALBUTEROL (PROVENTIL HFA;VENTOLIN HFA) 108 (90 BASE) MCG/ACT INHALER       CITALOPRAM (CELEXA) 40 MG TABLET    TAKE 1 TABLET DAILY   FLUTICASONE-UMECLIDIN-VILANT (TRELEGY ELLIPTA) 100-62.5-25 MCG/INH AEPB    Inhale 1 puff into the lungs daily.   IBUPROFEN (ADVIL) 200 MG TABLET    Take 400 mg by mouth every 6 (six) hours as needed.   LORAZEPAM (ATIVAN) 0.5 MG TABLET    TAKE 1 TABLET BY MOUTH TWICE DAILY AS NEEDED   LOSARTAN (COZAAR) 100 MG TABLET    TAKE 1 TABLET DAILY. NEEDS PHYSICAL   NAPROXEN (NAPROSYN) 500 MG TABLET    Take 1 tablet (500 mg total) by mouth 2 (two) times daily with a meal.   OMEPRAZOLE (PRILOSEC) 40 MG CAPSULE    Take 1 capsule (40 mg total) by mouth daily.   PRAVASTATIN (PRAVACHOL) 40 MG TABLET    TAKE 1 TABLET DAILY. YOU   NEED A PHYSICAL   TAMSULOSIN (FLOMAX) 0.4 MG CAPS CAPSULE    Take 1 capsule (0.4 mg total) by mouth daily.  Modified Medications   No medications on file  Discontinued Medications   No medications on file    Allergies: No Known Allergies  Past Medical History: Past Medical History:  Diagnosis Date  . ANXIETY 07/23/2008  . COLONIC POLYPS, HX OF 03/26/2006   MULTIPLE FRAGMENTS OF TUBULAR ADENOMAS POLYPS  . Family  history of colon cancer    Father   . GERD 07/23/2008  . HYPERLIPIDEMIA 07/23/2008  . HYPERTENSION 07/23/2008  . TENDINITIS, LEFT THUMB 07/15/2010    Social History: Social History   Socioeconomic History  . Marital status: Married    Spouse name: Not on file  . Number of children: 0  . Years of education: Not on file  . Highest education level: Not on file  Occupational History    Employer: [employer]  Social Needs  . Financial resource strain: Not on file  . Food insecurity:    Worry: Not on file    Inability: Not on file  . Transportation needs:    Medical: Not on file    Non-medical: Not on file  Tobacco Use  . Smoking status: Current Every Day Smoker    Packs/day: 1.50    Years: 35.00    Pack years: 52.50    Types: Cigarettes  . Smokeless tobacco: Never Used  . Tobacco comment: Started smoking at age 36.  Currently smoking 1 1/2 ppd.  Substance and Sexual Activity  . Alcohol use: No  . Drug use: No  . Sexual activity: Not on file  Lifestyle  . Physical activity:    Days per week: Not on file    Minutes  per session: Not on file  . Stress: Not on file  Relationships  . Social connections:    Talks on phone: Not on file    Gets together: Not on file    Attends religious service: Not on file    Active member of club or organization: Not on file    Attends meetings of clubs or organizations: Not on file    Relationship status: Not on file  Other Topics Concern  . Not on file  Social History Narrative  . Not on file    Labs: Hepatitis C Lab Results  Component Value Date   HCVGENOTYPE Not Detected 05/09/2018   HEPCAB REACTIVE (A) 04/17/2018   HCVRNAPCRQN 84 (H) 04/17/2018   FIBROSTAGE F0-F1 05/09/2018   Hepatitis B Lab Results  Component Value Date   HEPBSAB NON-REACTIVE 05/09/2018   HEPBSAG NON-REACTIVE 05/09/2018   Hepatitis A Lab Results  Component Value Date   HAV NON-REACTIVE 05/09/2018   HIV No results found for: HIV Lab  Results  Component Value Date   CREATININE 0.96 04/17/2018   CREATININE 0.91 06/25/2015   CREATININE 1.0 03/28/2010   CREATININE 0.9 07/15/2008   CREATININE 0.7 03/26/2007   Lab Results  Component Value Date   AST 20 04/17/2018   AST 14 06/25/2015   AST 20 03/28/2010   ALT 28 05/09/2018   ALT 28 04/17/2018   ALT 21 06/25/2015   INR 1.0 05/09/2018    Fibrosis Score: F2/F3 as assessed by [facility]   Assessment: Trevor Mack is here today to follow-up for his Hep C infection.  He last saw Marya Amsler and had his labs drawn where he had a low Hep C viral load of 84 and because of that, no genotype could be done.  We will repeat his labs today to see if he has active infection.  If so, we will most likely do Epclusa for him.  He is on omeprazole 40 mg and will need to decrease his dose to 20 mg.  If we do start Epclusa, he will need to take his Epclusa in the morning with food and then wait at least 4 hours to take his omeprazole. He is a little cautious with doing this as he states he will be miserable if he has to wait 4 hours to take his omeprazole.  I encouraged him to use baking soda or a teaspoon of mustard to help alleviate his symptoms and that he will only have to do this for the 3 months he is on treatment.  Discussed with Trevor Mack and we may try Mavyret as well but it does interact with his pravastatin. We will wait for labs and his genotype to come back and decide which treatment to put him on.  He is on [employer] and we will contact him once labs are back.   Plan: - Hep C VL and genotype - Will call when treatment decided  Kiamesha Samet L. Damian Buckles, PharmD, [place], [place] for Infectious Disease 05/23/2018, 11:36 AM

## 2018-05-27 ENCOUNTER — Telehealth: Payer: Self-pay | Admitting: Infectious Diseases

## 2018-05-27 LAB — HEPATITIS C RNA QUANTITATIVE
HCV QUANT LOG: NOT DETECTED {Log_IU}/mL
HCV RNA, PCR, QN: <15 IU/mL

## 2018-05-27 LAB — HEPATITIS C GENOTYPE: HCV GENOTYPE: NOT DETECTED

## 2018-05-27 NOTE — Telephone Encounter (Signed)
Patient called back stating he missed a call from this number.  Informed him [name] NP tried to call him but was unable to reach him.  Let him know per Stephanie's note that Mr. Trevor Mack hep C labwork came back and patient has no active infection and he does not need treatment.  Informed patient that Colletta Maryland recommends discussing his ultrasound results with Dr. Hilarie Fredrickson at his upcoming appointment due to nodular appearance.  Patient verbalized understanding and had not additional questions or concerns. Pricilla Riffle RN

## 2018-05-27 NOTE — Progress Notes (Signed)
Patient with undetectable hep c RNA. I suspect the low level detected virus a month prior was a false positive. No need for treatment. Will call patient with results.

## 2018-05-27 NOTE — Telephone Encounter (Signed)
Thank you :)

## 2018-05-27 NOTE — Telephone Encounter (Signed)
Attempted to call Trevor Mack with lab results. Phone kept picking up but hanging up on me during attempt to reach him at listed phone #.   I would like to inform him that his hep c lab work came back reporting that he does not have active infection and does not need any treatment. I would still recommend discussing with Dr. Hilarie Fredrickson at his upcoming appointment about the liver ultrasound results considering the nodular appearance (we discussed this during our visit the other day).   No further follow up needed.   [name], NP

## 2018-06-10 ENCOUNTER — Encounter: Payer: Self-pay | Admitting: *Deleted

## 2018-06-11 ENCOUNTER — Other Ambulatory Visit: Payer: Self-pay | Admitting: Internal Medicine

## 2018-07-01 ENCOUNTER — Ambulatory Visit: Payer: BC Managed Care – PPO | Admitting: Internal Medicine

## 2018-07-05 ENCOUNTER — Encounter: Payer: Self-pay | Admitting: Internal Medicine

## 2018-08-02 ENCOUNTER — Other Ambulatory Visit: Payer: Self-pay | Admitting: *Deleted

## 2018-08-02 NOTE — Telephone Encounter (Signed)
Last OV: 04/17/18 Last filled: 05/02/18, #180. 0 RF Sig: TAKE 1 TABLET BY MOUTH TWICE DAILY AS NEEDED

## 2018-08-05 MED ORDER — LORAZEPAM 0.5 MG PO TABS: 0.5000 mg | ORAL_TABLET | Freq: Two times a day (BID) | ORAL | 0 refills | Status: DC | PRN

## 2018-08-06 ENCOUNTER — Other Ambulatory Visit: Payer: Self-pay | Admitting: Internal Medicine

## 2018-08-06 NOTE — Telephone Encounter (Signed)
Pt called and stated that medication was not received by pharmacy. LORazepam (ATIVAN) 0.5 MG tablet [[id number]] pt would like to know if he can now get it sent in to  [facility],  - [address] AT [place] [phone] (Phone) [phone] (Fax)    Please advise BP#[phone]

## 2018-08-07 NOTE — Telephone Encounter (Signed)
Patient would like the lorazepam be sent to [facility], [place] - [address] AT [place]. Please advise.

## 2018-08-21 ENCOUNTER — Ambulatory Visit: Payer: BC Managed Care – PPO | Admitting: Pulmonary Disease

## 2018-09-12 ENCOUNTER — Ambulatory Visit: Payer: BC Managed Care – PPO | Admitting: Pulmonary Disease

## 2018-09-12 ENCOUNTER — Encounter: Payer: Self-pay | Admitting: Pulmonary Disease

## 2018-09-12 VITALS — BP 154/90 | HR 102 | Wt 206.6 lb

## 2018-09-12 DIAGNOSIS — G4733 Obstructive sleep apnea (adult) (pediatric): Secondary | ICD-10-CM

## 2018-09-12 DIAGNOSIS — F17210 Nicotine dependence, cigarettes, uncomplicated: Secondary | ICD-10-CM

## 2018-09-12 DIAGNOSIS — Z23 Encounter for immunization: Secondary | ICD-10-CM

## 2018-09-12 DIAGNOSIS — J441 Chronic obstructive pulmonary disease with (acute) exacerbation: Secondary | ICD-10-CM

## 2018-09-12 MED ORDER — ALBUTEROL SULFATE (2.5 MG/3ML) 0.083% IN NEBU: 2.5000 mg | INHALATION_SOLUTION | Freq: Four times a day (QID) | RESPIRATORY_TRACT | 11 refills | Status: DC | PRN

## 2018-09-12 NOTE — Progress Notes (Signed)
Subjective:    Patient ID: Trevor Mack, male    DOB: [birth year], 62 y.o.   MRN: [id number]  Synopsis: Former patient of Dr. Joya Gaskins with COPD.   HPI  Chief Complaint  Patient presents with  . Follow-up    SOB, smoking 1 and 1/2 ppd, reports dizziness frequently    Trevor Mack says that his breathing has been about the same.  He does not do much exercise right now.  He has not been sick since the last visit.  Specifically he has not been to the doctor for bronchitis or pneumonia.  He says that he walks his dog about 100 feet a day and that is the extent of his exercise.  He continues to use and benefit from Trelegy every day.  Past Medical History:  Diagnosis Date  . ANXIETY 07/23/2008  . COLONIC POLYPS, HX OF 03/26/2006   MULTIPLE FRAGMENTS OF TUBULAR ADENOMAS POLYPS  . Diverticulosis   . Family history of colon cancer    Father   . GERD 07/23/2008  . Hepatitis C   . HYPERLIPIDEMIA 07/23/2008  . HYPERTENSION 07/23/2008  . TENDINITIS, LEFT THUMB 07/15/2010     Review of Systems  Constitutional: Positive for fatigue. Negative for chills and fever.  HENT: Negative for rhinorrhea, sinus pressure and sinus pain.   Respiratory: Positive for cough and shortness of breath. Negative for wheezing.   Cardiovascular: Negative for chest pain, palpitations and leg swelling.       Objective:   Physical Exam  Vitals:   09/12/18 1054  BP: (!) 154/90  Pulse: (!) 102  SpO2: 96%  Weight: 206 lb 9.6 oz (93.7 kg)   RA  Gen: chronically ill appearing HENT: OP clear, TM's clear, neck supple PULM: CTA B, normal percussion CV: RRR, no mgr, trace edema GI: BS+, soft, nontender Derm: no cyanosis or rash Psyche: normal mood and affect  Sleep study: 12/2017: AHI normal, O2 mean 93%, lowest 91% he left the test early and says that he only got about 2 hours worth of sleep.  PFT: April 2018: Ratio 61%, FEV1 1.75 L 17% change, 50% predicted, FVC 2.85 L 61% predicted, total lung capacity 7.02 L  103% predicted, residual volume 214% predicted, DLCO 12.29 39% predicted  Records from primary care reviewed, he was asked with a musculoskeletal strain of the left shoulder been seen earlier in October    Assessment & Plan:    Chronic obstructive lung disease with acute on chronic bronchitis and exacerbation gold stage C - Plan: Ambulatory Referral for DME  Need for prophylactic vaccination against Streptococcus pneumoniae (pneumococcus)  Cigarette smoker  OSA (obstructive sleep apnea)  Discussion: This has been a stable interval for Clara.  We will give him a flu shot today.  He has some dyspnea from time to time at home so we will give him a nebulizer machine to use if he has increased wheezing.  Plan: Severe COPD: Stop smoking Continue Trelegy 1 puff daily Use albuterol as needed for chest tightness wheezing or shortness of breath, the nebulizer machine we gave you today can be used interchangeably with the rescue inhaler you carry with you. Practice good hand hygiene Stay active  Nicotine abuse: Stop smoking  Follow-up in 6 months or sooner if needed  Flu shot today   Current Outpatient Medications:  .  albuterol (PROVENTIL HFA;VENTOLIN HFA) 108 (90 Base) MCG/ACT inhaler, , Disp: , Rfl:  .  citalopram (CELEXA) 40 MG tablet, TAKE 1 TABLET DAILY, Disp:  90 tablet, Rfl: 0 .  Fluticasone-Umeclidin-Vilant (TRELEGY ELLIPTA) 100-62.5-25 MCG/INH AEPB, Inhale 1 puff into the lungs daily., Disp: 60 each, Rfl: 11 .  ibuprofen (ADVIL) 200 MG tablet, Take 400 mg by mouth every 6 (six) hours as needed., Disp: , Rfl:  .  LORazepam (ATIVAN) 0.5 MG tablet, Take 1 tablet (0.5 mg total) by mouth 2 (two) times daily as needed., Disp: 180 tablet, Rfl: 0 .  losartan (COZAAR) 100 MG tablet, TAKE 1 TABLET DAILY. NEEDS PHYSICAL, Disp: 90 tablet, Rfl: 1 .  naproxen (NAPROSYN) 500 MG tablet, Take 1 tablet (500 mg total) by mouth 2 (two) times daily with a meal., Disp: 14 tablet, Rfl: 0 .   omeprazole (PRILOSEC) 40 MG capsule, Take 1 capsule (40 mg total) by mouth daily., Disp: 90 capsule, Rfl: 3 .  pravastatin (PRAVACHOL) 40 MG tablet, TAKE 1 TABLET DAILY. YOU   NEED A PHYSICAL, Disp: 90 tablet, Rfl: 1 .  tamsulosin (FLOMAX) 0.4 MG CAPS capsule, Take 1 capsule (0.4 mg total) by mouth daily., Disp: 90 capsule, Rfl: 3 .  albuterol (PROVENTIL) (2.5 MG/3ML) 0.083% nebulizer solution, Take 3 mLs (2.5 mg total) by nebulization every 6 (six) hours as needed for wheezing or shortness of breath. Dx code: J44.9, Disp: 75 mL, Rfl: 11

## 2018-09-12 NOTE — Patient Instructions (Signed)
Severe COPD: Stop smoking Continue Trelegy 1 puff daily Use albuterol as needed for chest tightness wheezing or shortness of breath, the nebulizer machine we gave you today can be used interchangeably with the rescue inhaler you carry with you. Practice good hand hygiene Stay active  Nicotine abuse: Stop smoking  Follow-up in 6 months or sooner if needed  Flu shot today

## 2018-09-13 ENCOUNTER — Telehealth: Payer: Self-pay | Admitting: Pulmonary Disease

## 2018-09-13 NOTE — Telephone Encounter (Signed)
I have called and spoke to [facility] and [facility] it's the pharmacy that they are not contracted with [facility] they have sent his meds to [facility] and [facility] is working on the nebulizer

## 2018-09-13 NOTE — Telephone Encounter (Signed)
Pt is aware of this.  °

## 2018-10-07 ENCOUNTER — Other Ambulatory Visit: Payer: Self-pay | Admitting: Internal Medicine

## 2018-10-10 ENCOUNTER — Telehealth: Payer: Self-pay | Admitting: Pulmonary Disease

## 2018-10-10 NOTE — Telephone Encounter (Signed)
Nothing in Dr Anastasia Pall lookat on this pt  Called and spoke with rep at the number given  I advised form not received and gave fax number

## 2018-10-20 IMAGING — DX DG CHEST 2V
2 series · 2 of 2 positions shown · non-contrast
Comparison: 12/10/2012

CLINICAL DATA: Worsening shortness of breath, cough, and chest
congestion for 2 months. COPD.

EXAM:
CHEST  2 VIEW

[chest pa]
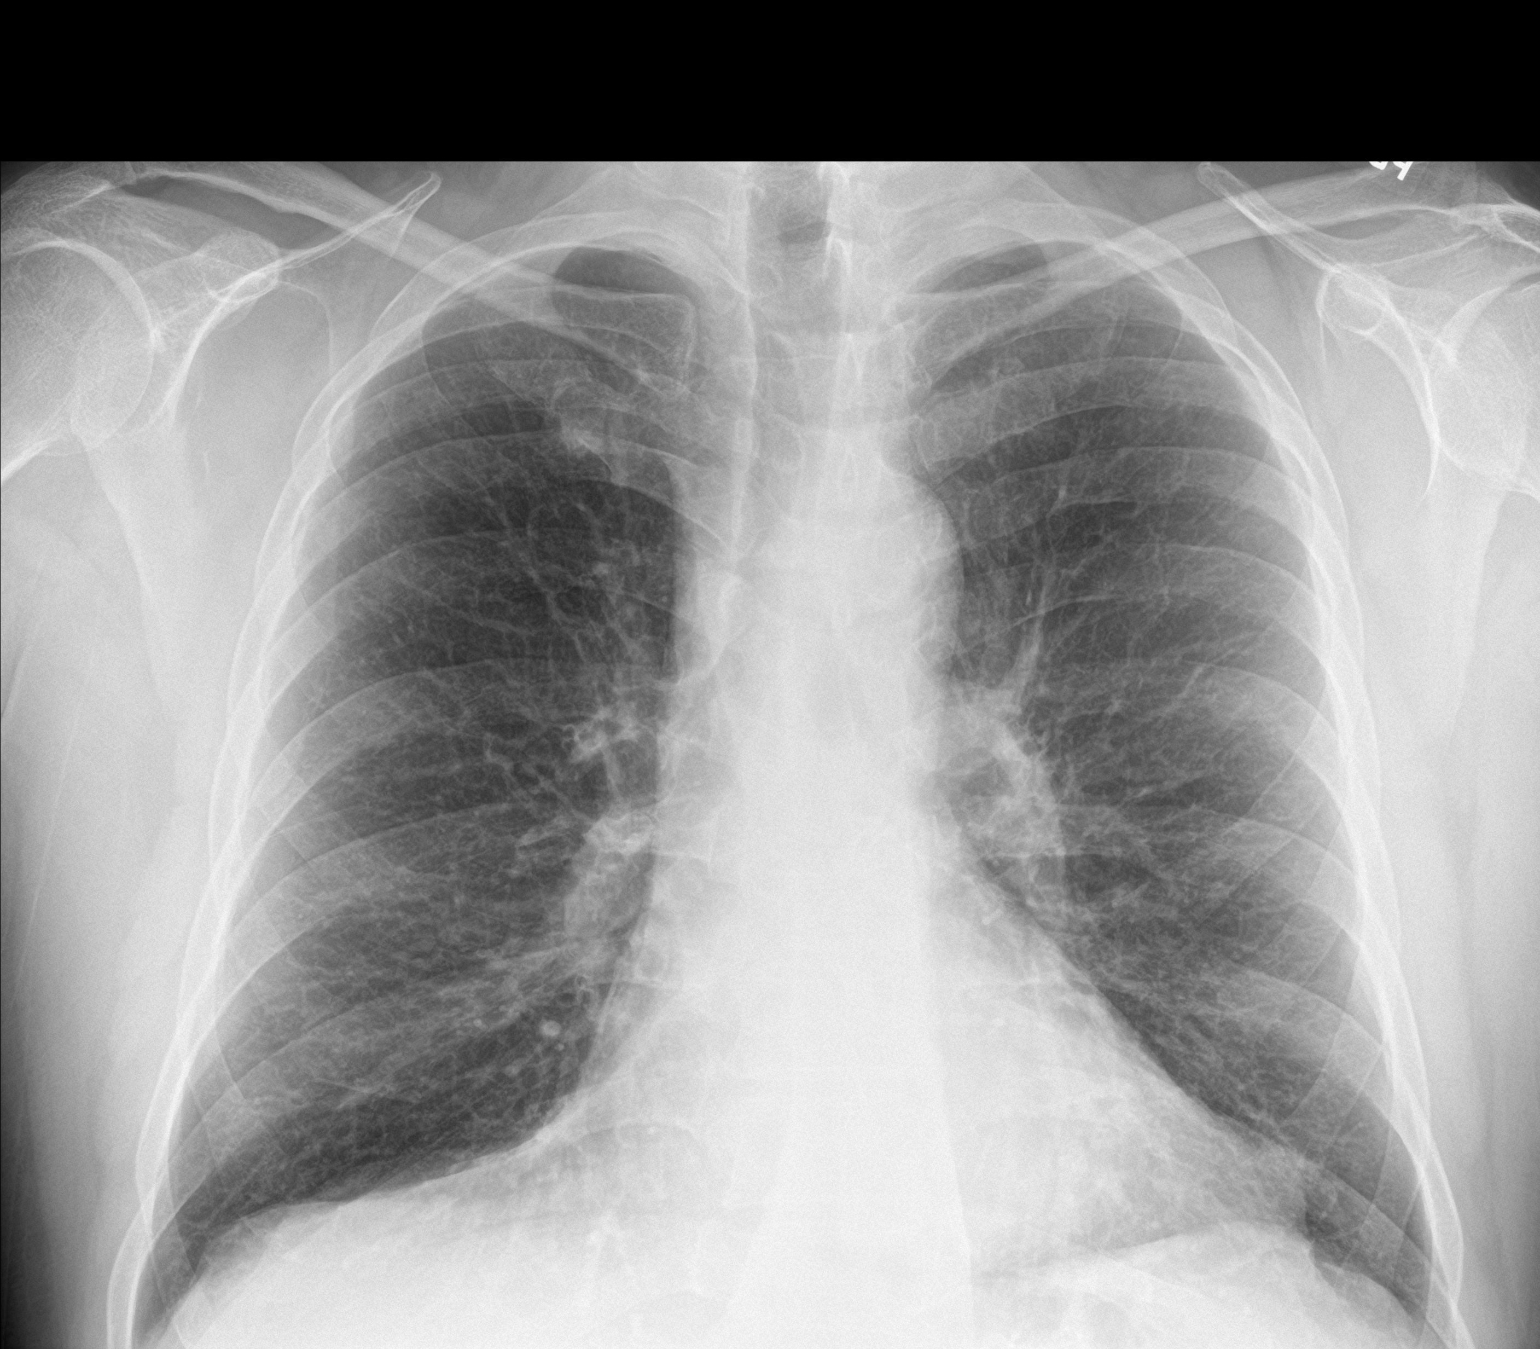

[chest lat]
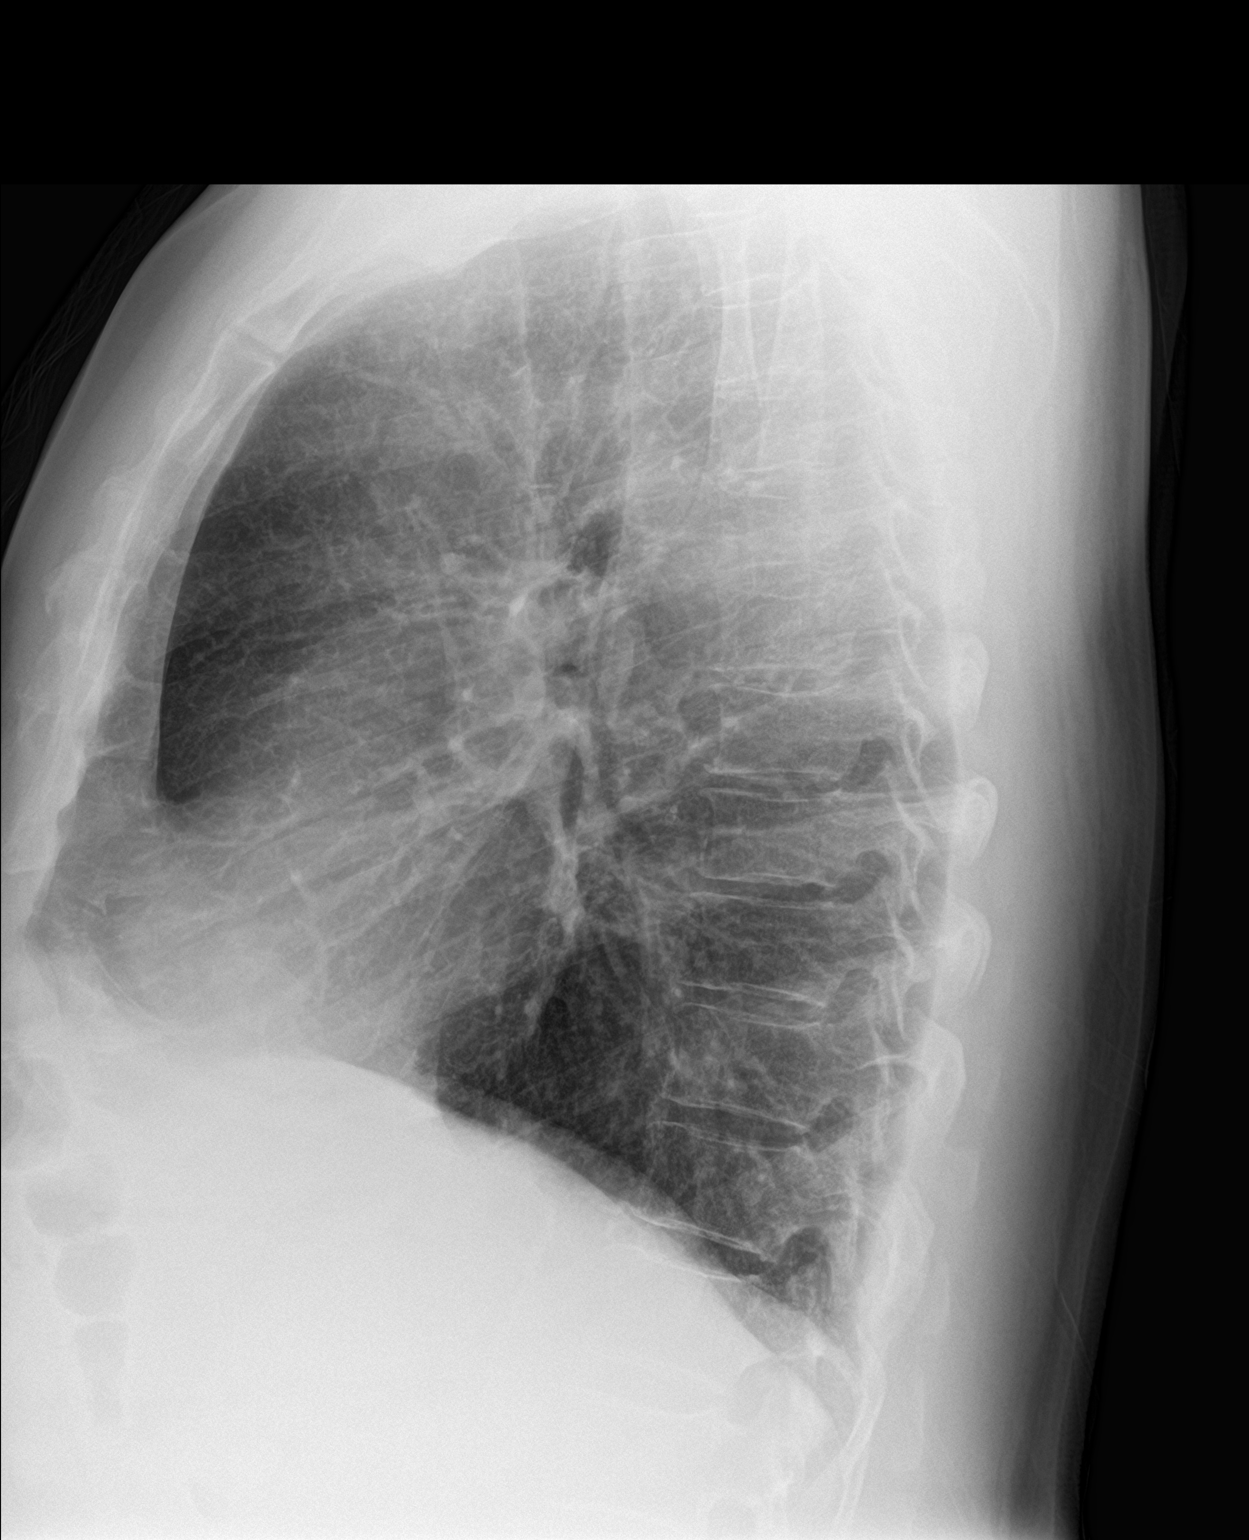

[2 of 2 positions shown; findings below may reference images not displayed]

FINDINGS: The heart size and mediastinal contours are within normal limits.
Stable pulmonary hyperinflation, consistent with COPD. No evidence
of pulmonary infiltrate or edema. No evidence of pleural effusion.
IMPRESSION: Stable exam.  COPD.  No active disease.

## 2018-10-23 ENCOUNTER — Telehealth: Payer: Self-pay | Admitting: Pulmonary Disease

## 2018-10-23 NOTE — Telephone Encounter (Signed)
Forms received and placed in BQ's lookat  Will forward to BJT to f/u on thanks

## 2018-10-23 NOTE — Telephone Encounter (Signed)
Checked with Burman Nieves and Rodena Piety and nothing was received on this   Spoke with Roanna Epley with [employer]  She is checking to see if we received the physicians statement that was mailed from 10/16/18 (coming from [place]) They had tried faxing it x 2 and it never went through  She will try faxing it the the triage fax  Will await fax and then give to BQ to complete

## 2018-10-24 ENCOUNTER — Ambulatory Visit: Payer: BC Managed Care – PPO | Admitting: Medical

## 2018-10-28 ENCOUNTER — Ambulatory Visit: Payer: BC Managed Care – PPO | Admitting: Medical

## 2018-10-28 ENCOUNTER — Encounter: Payer: Self-pay | Admitting: Medical

## 2018-10-28 VITALS — BP 150/85 | HR 95 | Temp 97.8°F | Resp 16 | Ht 69.0 in | Wt 205.0 lb

## 2018-10-28 DIAGNOSIS — R062 Wheezing: Secondary | ICD-10-CM

## 2018-10-28 DIAGNOSIS — R05 Cough: Secondary | ICD-10-CM | POA: Diagnosis not present

## 2018-10-28 DIAGNOSIS — J40 Bronchitis, not specified as acute or chronic: Secondary | ICD-10-CM

## 2018-10-28 DIAGNOSIS — Z79899 Other long term (current) drug therapy: Secondary | ICD-10-CM

## 2018-10-28 DIAGNOSIS — E785 Hyperlipidemia, unspecified: Secondary | ICD-10-CM

## 2018-10-28 DIAGNOSIS — I10 Essential (primary) hypertension: Secondary | ICD-10-CM

## 2018-10-28 DIAGNOSIS — R059 Cough, unspecified: Secondary | ICD-10-CM

## 2018-10-28 MED ORDER — AMLODIPINE BESYLATE 5 MG PO TABS: 5.0000 mg | ORAL_TABLET | Freq: Every day | ORAL | 3 refills | Status: DC

## 2018-10-28 MED ORDER — PREDNISONE 10 MG PO TABS: ORAL_TABLET | ORAL | 0 refills | Status: DC

## 2018-10-28 MED ORDER — AZITHROMYCIN 250 MG PO TABS: ORAL_TABLET | ORAL | 0 refills | Status: DC

## 2018-10-28 MED ORDER — BENZONATATE 100 MG PO CAPS: 100.0000 mg | ORAL_CAPSULE | Freq: Three times a day (TID) | ORAL | 0 refills | Status: DC | PRN

## 2018-10-28 MED ORDER — LOSARTAN POTASSIUM 100 MG PO TABS: ORAL_TABLET | ORAL | 1 refills | Status: DC

## 2018-10-28 MED ORDER — PRAVASTATIN SODIUM 40 MG PO TABS: ORAL_TABLET | ORAL | 1 refills | Status: DC

## 2018-10-28 NOTE — Progress Notes (Signed)
Subjective:    Patient ID: Trevor Mack, male    DOB: 1956-04-04, 62 y.o.   MRN: [id number]  HPI  Pt in for first time.  Pt had been with Dr. Raliegh Ip at [place]. MD did retire. He needs new pcp.  Pt used to work for [employer]. 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 He has disability for copd. Does not exercise exercise regularly due to copd.    Pt states has uri type symptoms for 3 weeks. States not getting worse has nasal and chest congestion. He is coughing up mucus. No sinus pressure. Some worse wheezing recently. Pt has been using albuterol at least every 6 hours. He is also on Trilegy. Pt still smoker.  Pt has likely false positive hep C test. Seen by ID.  Pt has hx of high cholesterol. He has statin med for one week or more.  Pt in for follow up. He has been on medication for anxietymedicine ativan  for 4-5 years. Former pcp gave the rx.  Pt has htn hx. Bp recently not well controlled.   Review of Systems  Constitutional: Negative for chills, fatigue and fever.  HENT: Positive for congestion.   Respiratory: Positive for cough and wheezing. Negative for chest tightness and shortness of breath.        Chest congestion.  Cardiovascular: Negative for chest pain and palpitations.  Gastrointestinal: Negative for abdominal pain.  Musculoskeletal: Negative for back pain.  Skin: Negative for rash.  Neurological: Negative for dizziness, tremors, speech difficulty, weakness and headaches.  Hematological: Negative for adenopathy. Does not bruise/bleed easily.  Psychiatric/Behavioral: Negative for behavioral problems, confusion, hallucinations, sleep disturbance and suicidal ideas. The patient is nervous/anxious.     Past Medical History:  Diagnosis Date  . ANXIETY 07/23/2008  . COLONIC POLYPS, HX OF 03/26/2006   MULTIPLE FRAGMENTS OF TUBULAR ADENOMAS POLYPS  . Diverticulosis   . Family history of colon cancer    Father   . GERD 07/23/2008  . Hepatitis C   .  HYPERLIPIDEMIA 07/23/2008  . HYPERTENSION 07/23/2008  . TENDINITIS, LEFT THUMB 07/15/2010     Social History   Socioeconomic History  . Marital status: Widowed    Spouse name: Not on file  . Number of children: 0  . Years of education: Not on file  . Highest education level: Not on file  Occupational History    Employer: [employer]  Social Needs  . Financial resource strain: Not on file  . Food insecurity:    Worry: Not on file    Inability: Not on file  . Transportation needs:    Medical: Not on file    Non-medical: Not on file  Tobacco Use  . Smoking status: Current Every Day Smoker    Packs/day: 1.50    Years: 35.00    Pack years: 52.50    Types: Cigarettes  . Smokeless tobacco: Never Used  . Tobacco comment: Started smoking at age 65.  Currently smoking 1 1/2 ppd.  Substance and Sexual Activity  . Alcohol use: No  . Drug use: No  . Sexual activity: Not on file  Lifestyle  . Physical activity:    Days per week: Not on file    Minutes per session: Not on file  . Stress: Not on file  Relationships  . Social connections:    Talks on phone: Not on file    Gets together: Not on file    Attends religious service: Not on  file    Active member of club or organization: Not on file    Attends meetings of clubs or organizations: Not on file    Relationship status: Not on file  . Intimate partner violence:    Fear of current or ex partner: Not on file    Emotionally abused: Not on file    Physically abused: Not on file    Forced sexual activity: Not on file  Other Topics Concern  . Not on file  Social History Narrative  . Not on file    Past Surgical History:  Procedure Laterality Date  . LUMBAR LAMINECTOMY      Family History  Problem Relation Age of Onset  . Colon cancer Father   . Hypertension Father   . Diabetes Father   . Heart attack Father     No Known Allergies  Current Outpatient Medications on File Prior to Visit  Medication Sig  Dispense Refill  . albuterol (PROVENTIL HFA;VENTOLIN HFA) 108 (90 Base) MCG/ACT inhaler     . albuterol (PROVENTIL) (2.5 MG/3ML) 0.083% nebulizer solution Take 3 mLs (2.5 mg total) by nebulization every 6 (six) hours as needed for wheezing or shortness of breath. Dx code: J44.9 75 mL 11  . citalopram (CELEXA) 40 MG tablet TAKE 1 TABLET DAILY 90 tablet 0  . Fluticasone-Umeclidin-Vilant (TRELEGY ELLIPTA) 100-62.5-25 MCG/INH AEPB Inhale 1 puff into the lungs daily. 60 each 11  . ibuprofen (ADVIL) 200 MG tablet Take 400 mg by mouth every 6 (six) hours as needed.    Marland Kitchen LORazepam (ATIVAN) 0.5 MG tablet Take 1 tablet (0.5 mg total) by mouth 2 (two) times daily as needed. 180 tablet 0  . omeprazole (PRILOSEC) 40 MG capsule Take 1 capsule (40 mg total) by mouth daily. 90 capsule 3  . pravastatin (PRAVACHOL) 40 MG tablet TAKE 1 TABLET DAILY. YOU   NEED A PHYSICAL 90 tablet 1  . tamsulosin (FLOMAX) 0.4 MG CAPS capsule Take 1 capsule (0.4 mg total) by mouth daily. 90 capsule 3   No current facility-administered medications on file prior to visit.     BP (!) 167/75   Pulse 95   Temp 97.8 F (36.6 C) (Oral)   Resp 16   Ht 5\' 9"  (1.753 m)   Wt 205 lb (93 kg)   SpO2 98%   BMI 30.27 kg/m       Objective:   Physical Exam  General  Mental Status - Alert. General Appearance - Well groomed. Not in acute distress.  Skin Rashes- No Rashes.  HEENT Head- Normal. Ear Auditory Canal - Left- Normal. Right - Normal.Tympanic Membrane- Left- Normal. Right- Normal. Eye Sclera/Conjunctiva- Left- Normal. Right- Normal. Nose & Sinuses Nasal Mucosa- Left-  Boggy and Congested. Right-  Boggy and  Congested.Bilateral no  maxillary and no  frontal sinus pressure. Mouth & Throat Lips: Upper Lip- Normal: no dryness, cracking, pallor, cyanosis, or vesicular eruption. Lower Lip-Normal: no dryness, cracking, pallor, cyanosis or vesicular eruption. Buccal Mucosa- Bilateral- No Aphthous ulcers. Oropharynx- No  Discharge or Erythema. Tonsils: Characteristics- Bilateral- No Erythema or Congestion. Size/Enlargement- Bilateral- No enlargement. Discharge- bilateral-None.  Neck Neck- Supple. No Masses.   Chest and Lung Exam Auscultation: Breath Sounds:-Clear even and unlabored.  Cardiovascular Auscultation:Rythm- Regular, rate and rhythm. Murmurs & Other Heart Sounds:Ausculatation of the heart reveal- No Murmurs.  Lymphatic Head & Neck General Head & Neck Lymphatics: Bilateral: Description- No Localized lymphadenopathy.       Assessment & Plan:  You appear to have  bronchitis. Rest hydrate and tylenol for fever. I am prescribing cough medicine benzonatate ,  And azithromycin antibiotic. For your nasal congestion rx flonase.  For wheezing continue with your inhalers. Since wheezing/having to use inhalers very frequently will rx tapered prednisone.  Please get cxr today.  Added amlodipine to current regimen for bp.  For anxiety will get you to sign controlled med contract and go ahead and give uds. Then when you need refill can send that in.  For high cholesterol placed future lab to get cmp and lipid panel.  Follow up 2 weeks or as needed  [employer], PA-C

## 2018-10-28 NOTE — Patient Instructions (Addendum)
You appear to have bronchitis. Rest hydrate and tylenol for fever. I am prescribing cough medicine benzonatate ,  And azithromycin antibiotic. For your nasal congestion rx flonase.  For wheezing continue with your inhalers. Since wheezing/having to use inhalers very frequently will rx tapered prednisone.  Please get cxr today.  Added amlodipine to current regimen for bp.  For anxiety will get you to sign controlled med contract and go ahead. Then when you need refill can send that in.  For high cholesterol placed future lab to get cmp and lipid panel.(uds on day you get labs done).  Follow up in 2 weeks or as needed

## 2018-10-29 ENCOUNTER — Other Ambulatory Visit: Payer: Self-pay | Admitting: Medical

## 2018-10-29 MED ORDER — CITALOPRAM HYDROBROMIDE 40 MG PO TABS: ORAL_TABLET | ORAL | 0 refills | Status: DC

## 2018-10-29 NOTE — Telephone Encounter (Signed)
Checked BQ box have not found this paperwork Called and spoke with Lilyan Punt with [employer] for insurance forms at phone [phone] Advised her we are in need of the disability forms refaxed to triage at fax [phone] Awaiting forms this morning, still have not rec'd any disability forms. Once rec'd forms, they need to be given to Burman Nieves in hand for BQ to sign and complete. Once completed and signed by BQ, forms needs to be faxed Fax number is [phone], Loann Quill- Ref # [id number]  Routing message to Burman Nieves to f/u later tomorrow on the forms.

## 2018-10-29 NOTE — Telephone Encounter (Signed)
Called and spoke to Ace with [employer], who states forms have not been received as of yet.  It appears that BQ's look at folder is at our [place] office.  Cherina, can you assist with this?

## 2018-10-29 NOTE — Telephone Encounter (Signed)
Requested medication (s) are due for refill today -yes  Requested medication (s) are on the active medication list -yes  Future visit scheduled -yes- 11/11/18 to get established  Last refill: 04/11/18  Notes to clinic: patient will be transfer care- former Dr Burnice Logan patient- has appointment scheduled 11/11/18  Requested Prescriptions  Pending Prescriptions Disp Refills   citalopram (CELEXA) 40 MG tablet 90 tablet 0    Sig: TAKE 1 TABLET DAILY     Psychiatry:  Antidepressants - SSRI Passed - 10/29/2018 10:40 AM      Passed - Valid encounter within last 6 months    Recent Outpatient Visits          [facility] at [place], [place]   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   [facility] at [facility], Doretha Sou, MD   9 months ago Tobacco use   Therapist, music at [facility], Doretha Sou, MD   1 year ago Left arm pain   Therapist, music at [facility], Nickola Major, DO   1 year ago Essential hypertension   Therapist, music at [facility], Doretha Sou, MD      Future Appointments            In 2 weeks Saguier, Percell Miller, PA-C [employer] [facility] at [facility], [facility]            Requested Prescriptions  Pending Prescriptions Disp Refills   citalopram (CELEXA) 40 MG tablet 90 tablet 0    Sig: TAKE 1 TABLET DAILY     Psychiatry:  Antidepressants - SSRI Passed - 10/29/2018 10:40 AM      Passed - Valid encounter within last 6 months    Recent Outpatient Visits          [facility] at [place], [place]   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   [facility] [facility] at [facility], Doretha Sou, MD   9 months ago Tobacco use   Therapist, music at [facility], Doretha Sou, MD   1 year ago Left arm pain   Therapist, music at [facility],  [place], DO   1 year ago Essential hypertension   Therapist, music at [facility], Doretha Sou, MD      Future Appointments            In 2 weeks Saguier, Percell Miller, PA-C [facility] at [facility], [facility]

## 2018-10-29 NOTE — Telephone Encounter (Signed)
Copied from [place] [id number]. Topic: Quick Communication - Rx Refill/Question >> Oct 29, 2018 10:37 AM Percell Belt A wrote: Medication: citalopram (CELEXA) 40 MG tablet [[id number]]  Has the patient contacted their pharmacy? No. (Agent: If no, request that the patient contact the pharmacy for the refill.) (Agent: If yes, when and what did the pharmacy advise?)  Preferred Pharmacy (with phone number or street name): [facility] [phone] (Phone)   Agent: Please be advised that RX refills may take up to 3 business days. We ask that you follow-up with your pharmacy.

## 2018-10-29 NOTE — Telephone Encounter (Signed)
Will send 30 day supply only until established w/ [name].

## 2018-10-29 NOTE — Telephone Encounter (Signed)
I do not have BQ's look-at folder. It is still in [place].

## 2018-10-29 NOTE — Telephone Encounter (Signed)
rec'd fax from [employer] for BQ to sign and complete. Routing to [employer]

## 2018-10-30 NOTE — Telephone Encounter (Signed)
Trevor Mack, please provide follow up once available. Thank you.

## 2018-11-01 NOTE — Telephone Encounter (Signed)
Patient's disability paperwork is in BQ "to do" pink folder in cubby on [address]. Needs to be completed and signed. BQ will be back in the office on 11/08/18 - the date our office is moving location.  BQ please advise once paperwork is signed. Thank you.

## 2018-11-11 ENCOUNTER — Ambulatory Visit (HOSPITAL_BASED_OUTPATIENT_CLINIC_OR_DEPARTMENT_OTHER)
Admission: RE | Admit: 2018-11-11 | Discharge: 2018-11-11 | Disposition: A | Discharge status: Home / Self Care | Payer: BC Managed Care – PPO | Source: Ambulatory Visit | Attending: Medical | Admitting: Medical

## 2018-11-11 ENCOUNTER — Other Ambulatory Visit (INDEPENDENT_AMBULATORY_CARE_PROVIDER_SITE_OTHER): Payer: BC Managed Care – PPO

## 2018-11-11 ENCOUNTER — Telehealth: Payer: Self-pay | Admitting: Medical

## 2018-11-11 DIAGNOSIS — R05 Cough: Secondary | ICD-10-CM | POA: Insufficient documentation

## 2018-11-11 DIAGNOSIS — E785 Hyperlipidemia, unspecified: Secondary | ICD-10-CM

## 2018-11-11 DIAGNOSIS — Z79899 Other long term (current) drug therapy: Secondary | ICD-10-CM

## 2018-11-11 DIAGNOSIS — R059 Cough, unspecified: Secondary | ICD-10-CM

## 2018-11-11 DIAGNOSIS — J40 Bronchitis, not specified as acute or chronic: Secondary | ICD-10-CM

## 2018-11-11 LAB — COMPREHENSIVE METABOLIC PANEL
ALK PHOS: 104 U/L (ref 39–117)
ALT: 35 U/L (ref 0–53)
AST: 22 U/L (ref 0–37)
Albumin: 4.2 g/dL (ref 3.5–5.2)
BUN: 17 mg/dL (ref 6–23)
CALCIUM: 9.3 mg/dL (ref 8.4–10.5)
CO2: 27 mEq/L (ref 19–32)
Chloride: 101 mEq/L (ref 96–112)
Creatinine, Ser: 1.01 mg/dL (ref 0.40–1.50)
GFR: 79.67 mL/min (ref >60.00)
Glucose, Bld: 110 mg/dL — ABNORMAL HIGH (ref 70–99)
POTASSIUM: 4.6 meq/L (ref 3.5–5.1)
Sodium: 139 mEq/L (ref 135–145)
TOTAL PROTEIN: 7.3 g/dL (ref 6.0–8.3)
Total Bilirubin: 1 mg/dL (ref 0.2–1.2)

## 2018-11-11 LAB — LIPID PANEL
Cholesterol: 197 mg/dL (ref 0–200)
HDL: 31.6 mg/dL — AB (ref >39.00)
LDL Cholesterol: 129 mg/dL — ABNORMAL HIGH (ref 0–99)
NONHDL: 165.63
TRIGLYCERIDES: 183 mg/dL — AB (ref 0.0–149.0)
Total CHOL/HDL Ratio: 6
VLDL: 36.6 mg/dL (ref 0.0–40.0)

## 2018-11-11 NOTE — Telephone Encounter (Signed)
Papers are in the to do folder. I will fax once they have been completed.

## 2018-11-11 NOTE — Telephone Encounter (Signed)
Pt has appointment 11/13/18 will do UDS then

## 2018-11-11 NOTE — Telephone Encounter (Signed)
Pt was her earlier today and he could not give urine for uds. Let him know that we need that done before he is due for any refills of ativan. Let him know I did talk with Dr. Etter Sjogren and she ok'd prescribing it for the short term. But he needs to make that appointment psychiatrist in January. Make sure he calls them and gets that scheduled since I won't be able to write that prescribe but only for short term. Come in later this week and give the uds.

## 2018-11-12 NOTE — Telephone Encounter (Signed)
Received signed forms back from BQ. Called [employer] to get the correct fax number. Forms have been faxed. Will place in BQ's scan folder tomorrow once I return back to the [place] office.

## 2018-11-13 ENCOUNTER — Ambulatory Visit: Payer: BC Managed Care – PPO | Admitting: Medical

## 2018-11-13 ENCOUNTER — Encounter: Payer: Self-pay | Admitting: Medical

## 2018-11-13 VITALS — BP 120/69 | HR 98 | Temp 97.8°F | Resp 16 | Ht 69.0 in | Wt 200.4 lb

## 2018-11-13 DIAGNOSIS — Z23 Encounter for immunization: Secondary | ICD-10-CM | POA: Diagnosis not present

## 2018-11-13 DIAGNOSIS — I10 Essential (primary) hypertension: Secondary | ICD-10-CM | POA: Diagnosis not present

## 2018-11-13 DIAGNOSIS — E785 Hyperlipidemia, unspecified: Secondary | ICD-10-CM | POA: Diagnosis not present

## 2018-11-13 DIAGNOSIS — K429 Umbilical hernia without obstruction or gangrene: Secondary | ICD-10-CM

## 2018-11-13 DIAGNOSIS — Z79899 Other long term (current) drug therapy: Secondary | ICD-10-CM

## 2018-11-13 DIAGNOSIS — F419 Anxiety disorder, unspecified: Secondary | ICD-10-CM | POA: Diagnosis not present

## 2018-11-13 DIAGNOSIS — J40 Bronchitis, not specified as acute or chronic: Secondary | ICD-10-CM

## 2018-11-13 MED ORDER — LORAZEPAM 0.5 MG PO TABS: 0.5000 mg | ORAL_TABLET | Freq: Two times a day (BID) | ORAL | 0 refills | Status: DC

## 2018-11-13 NOTE — Patient Instructions (Signed)
For your history of anxiety, UDS and controlled medication contract signed.  Prescribing Ativan refill today as it appears you ran out of Ativan about 1 week ago.  Take medication only as directed and in the future you may be able to taper off of this medication.  You have been on medication for 4 to 5 years so if we do taper then would do so very slowly.  Your blood pressure is much improved with addition of amlodipine.  Continue that medication.  Cholesterol recently mild high.  Continue pravastatin.  Will recheck lipid panel in 3 months.  Might consider switching you to medication such as Crestor or atorvastatin.  Low-cholesterol diet and get some daily exercise.  You do have a small umbilical hernia present for 1 to 2 years.  In no indication of complication.  Offered referral to general surgeon and you declined.  If you have any worsening symptoms as described then recommend ED evaluation.  If you change your mind on referral let me know.  Bronchitis signs symptoms are much improved.  Continue current medications prescribed other day.  Follow-up in 3 months or as needed.

## 2018-11-13 NOTE — Progress Notes (Signed)
Subjective:    Patient ID: Trevor Mack, male    DOB: 1956-01-28, 62 y.o.   MRN: [id number]  HPI   Pt in for follow up.  Pt could not give uds the other day. He did give uds today. He ran out of  Ativan medicine last week. He states starting to get very nervous. On med 4-5 years. Last rx should have expired 11-05-2018.  Pt bronchitis symptoms are improved wheezing. Got better with treatment given the other day.  Pt bp is better after starting amlodipine.  Pt cholesterol mild high recently. Pt on pravastatin.  Mentions one year or two umblicus bulge but no pain. No fever, no chills,no sweats.     Review of Systems  Constitutional: Negative for activity change, chills, fatigue and fever.  Respiratory: Negative for cough, chest tightness and shortness of breath.   Cardiovascular: Negative for chest pain, palpitations and leg swelling.  Gastrointestinal: Negative for abdominal pain, nausea and vomiting.  Genitourinary: Negative for decreased urine volume, dysuria, flank pain, frequency, penile pain and testicular pain.  Musculoskeletal: Negative for back pain, neck pain and neck stiffness.  Skin: Negative for rash.  Neurological: Negative for dizziness, seizures, syncope, weakness and headaches.  Psychiatric/Behavioral: Negative for agitation, behavioral problems, confusion, dysphoric mood, sleep disturbance and suicidal ideas. The patient is nervous/anxious.     Past Medical History:  Diagnosis Date  . ANXIETY 07/23/2008  . COLONIC POLYPS, HX OF 03/26/2006   MULTIPLE FRAGMENTS OF TUBULAR ADENOMAS POLYPS  . Diverticulosis   . Family history of colon cancer    Father   . GERD 07/23/2008  . Hepatitis C   . HYPERLIPIDEMIA 07/23/2008  . HYPERTENSION 07/23/2008  . TENDINITIS, LEFT THUMB 07/15/2010     Social History   Socioeconomic History  . Marital status: Widowed    Spouse name: Not on file  . Number of children: 0  . Years of education: Not on file  . Highest education  level: Not on file  Occupational History    Employer: [employer]  Social Needs  . Financial resource strain: Not on file  . Food insecurity:    Worry: Not on file    Inability: Not on file  . Transportation needs:    Medical: Not on file    Non-medical: Not on file  Tobacco Use  . Smoking status: Current Every Day Smoker    Packs/day: 1.50    Years: 35.00    Pack years: 52.50    Types: Cigarettes  . Smokeless tobacco: Never Used  . Tobacco comment: Started smoking at age 32.  Currently smoking 1 1/2 ppd.  Substance and Sexual Activity  . Alcohol use: No  . Drug use: No  . Sexual activity: Not on file  Lifestyle  . Physical activity:    Days per week: Not on file    Minutes per session: Not on file  . Stress: Not on file  Relationships  . Social connections:    Talks on phone: Not on file    Gets together: Not on file    Attends religious service: Not on file    Active member of club or organization: Not on file    Attends meetings of clubs or organizations: Not on file    Relationship status: Not on file  . Intimate partner violence:    Fear of current or ex partner: Not on file    Emotionally abused: Not on file    Physically abused: Not on  file    Forced sexual activity: Not on file  Other Topics Concern  . Not on file  Social History Narrative  . Not on file    Past Surgical History:  Procedure Laterality Date  . LUMBAR LAMINECTOMY      Family History  Problem Relation Age of Onset  . Colon cancer Father   . Hypertension Father   . Diabetes Father   . Heart attack Father     No Known Allergies  Current Outpatient Medications on File Prior to Visit  Medication Sig Dispense Refill  . albuterol (PROVENTIL HFA;VENTOLIN HFA) 108 (90 Base) MCG/ACT inhaler     . albuterol (PROVENTIL) (2.5 MG/3ML) 0.083% nebulizer solution Take 3 mLs (2.5 mg total) by nebulization every 6 (six) hours as needed for wheezing or shortness of breath. Dx code: J44.9  75 mL 11  . amLODipine (NORVASC) 5 MG tablet Take 1 tablet (5 mg total) by mouth daily. 30 tablet 3  . azithromycin (ZITHROMAX) 250 MG tablet Take 2 tablets by mouth on day 1, followed by 1 tablet by mouth daily for 4 days. 6 tablet 0  . benzonatate (TESSALON) 100 MG capsule Take 1 capsule (100 mg total) by mouth 3 (three) times daily as needed for cough. 30 capsule 0  . citalopram (CELEXA) 40 MG tablet TAKE 1 TABLET DAILY 30 tablet 0  . Fluticasone-Umeclidin-Vilant (TRELEGY ELLIPTA) 100-62.5-25 MCG/INH AEPB Inhale 1 puff into the lungs daily. 60 each 11  . ibuprofen (ADVIL) 200 MG tablet Take 400 mg by mouth every 6 (six) hours as needed.    Marland Kitchen LORazepam (ATIVAN) 0.5 MG tablet Take 1 tablet (0.5 mg total) by mouth 2 (two) times daily as needed. 180 tablet 0  . losartan (COZAAR) 100 MG tablet TAKE 1 TABLET DAILY. NEEDS PHYSICAL 90 tablet 1  . omeprazole (PRILOSEC) 40 MG capsule Take 1 capsule (40 mg total) by mouth daily. 90 capsule 3  . pravastatin (PRAVACHOL) 40 MG tablet TAKE 1 TABLET DAILY 90 tablet 1  . predniSONE (DELTASONE) 10 MG tablet 6 TAB PO DAY 1 5 TAB PO DAY 2 4 TAB PO DAY 3 3 TAB PO DAY 4 2 TAB PO DAY 5 1 TAB PO DAY 6 21 tablet 0  . tamsulosin (FLOMAX) 0.4 MG CAPS capsule Take 1 capsule (0.4 mg total) by mouth daily. 90 capsule 3   No current facility-administered medications on file prior to visit.     BP 120/69   Pulse 98   Temp 97.8 F (36.6 C) (Oral)   Resp 16   Ht 5\' 9"  (1.753 m)   Wt 200 lb 6.4 oz (90.9 kg)   SpO2 98%   BMI 29.59 kg/m       Objective:   Physical Exam  General Mental Status- Alert. General Appearance- Not in acute distress.   Skin General: Color- Normal Color. Moisture- Normal Moisture.  Neck Carotid Arteries- Normal color. Moisture- Normal Moisture. No carotid bruits. No JVD.  Chest and Lung Exam Auscultation: Breath Sounds:-Normal.  Cardiovascular Auscultation:Rythm- Regular. Murmurs & Other Heart Sounds:Auscultation of the  heart reveals- No Murmurs.  Abdomen Inspection:-Inspeection Normal. Palpation/Percussion:Note:No mass. Palpation and Percussion of the abdomen reveal- Non Tender, Non Distended + BS, no rebound or guarding. Umbilical henria small. No tender to palpation.   Neurologic Cranial Nerve exam:- CN III-XII intact(No nystagmus), symmetric smile. Strength:- 5/5 equal and symmetric strength both upper and lower extremities.        Assessment & Plan:  For your history  of anxiety, UDS and controlled medication contract signed.  Prescribing Ativan refill today as it appears you ran out of Ativan about 1 week ago.  Take medication only as directed and in the future you may be able to taper off of this medication.  You have been on medication for 4 to 5 years so if we do taper then would do so very slowly.  Your blood pressure is much improved with addition of amlodipine.  Continue that medication.  Cholesterol recently mild high.  Continue pravastatin.  Will recheck lipid panel in 3 months.  Might consider switching you to medication such as Crestor or atorvastatin.  Low-cholesterol diet and get some daily exercise.  You do have a small umbilical hernia present for 1 to 2 years.  In no indication of complication.  Offered referral to general surgeon and you declined.  If you have any worsening symptoms as described then recommend ED evaluation.  If you change your mind on referral let me know.  Bronchitis signs symptoms are much improved.  Continue current medications prescribed other day.  Follow-up in 3 months or as needed.  Mackie Pai, PA-C

## 2018-11-14 LAB — PAIN MGMT, PROFILE 8 W/CONF, U
6 ACETYLMORPHINE: NEGATIVE ng/mL (ref <10)
Alcohol Metabolites: NEGATIVE ng/mL (ref <500)
Amphetamines: NEGATIVE ng/mL (ref <500)
BUPRENORPHINE, URINE: NEGATIVE ng/mL (ref <5)
Benzodiazepines: NEGATIVE ng/mL (ref <100)
CREATININE: 227.1 mg/dL
Cocaine Metabolite: NEGATIVE ng/mL (ref <150)
MARIJUANA METABOLITE: NEGATIVE ng/mL (ref <20)
MDMA: NEGATIVE ng/mL (ref <500)
OPIATES: NEGATIVE ng/mL (ref <100)
Oxidant: NEGATIVE ug/mL (ref <200)
Oxycodone: NEGATIVE ng/mL (ref <100)
PH: 5.93 (ref 4.5–9.0)

## 2018-12-03 ENCOUNTER — Other Ambulatory Visit: Payer: Self-pay | Admitting: Medical

## 2018-12-11 ENCOUNTER — Other Ambulatory Visit: Payer: Self-pay | Admitting: Medical

## 2018-12-12 NOTE — Telephone Encounter (Signed)
Refill of ativan sent to pt pharmacy.

## 2019-01-10 ENCOUNTER — Other Ambulatory Visit: Payer: Self-pay | Admitting: Medical

## 2019-01-10 ENCOUNTER — Telehealth: Payer: Self-pay | Admitting: Medical

## 2019-01-10 NOTE — Telephone Encounter (Signed)
Telephone call to Patient Inquired if he requested medication refill?  Patient states yesterday th called an requested refil for Pravacol 4o mg and Cozaar 100mg .  Related to Patient that it was too earlier to refill both medications due date would be February 4th, 2020.  Telephone call to pharmacy [facility] no answer.  Left a detailed message on phone trying  to get clarification .  Called Patient back to let him know I tried to contact pharmacy.  Advised him to contact  [facility] also. Patient voiced understanding.

## 2019-01-10 NOTE — Telephone Encounter (Signed)
Copied from [place] [phone]. Topic: Quick Communication - Rx Refill/Question >> Jan 10, 2019  3:04 PM Windy Kalata wrote: Medication: pravastatin (PRAVACHOL) 40 MG tablet, losartan (COZAAR) 100 MG tablet ,  Has the patient contacted their pharmacy? No. (Agent: If no, request that the patient contact the pharmacy for the refill.) (Agent: If yes, when and what did the pharmacy advise?)  Preferred Pharmacy (with phone number or street name): [facility] [phone] (Phone) [phone] (Fax)    Agent: Please be advised that RX refills may take up to 3 business days. We ask that you follow-up with your pharmacy.

## 2019-01-13 ENCOUNTER — Other Ambulatory Visit: Payer: Self-pay | Admitting: Medical

## 2019-01-14 NOTE — Telephone Encounter (Signed)
Refill request: Lorazepam  Last RX: 12/12/18 Last OV:11/13/18 Next WB:LTGA scheduled UDS:11/11/18 CSC:10/28/18 CSR:

## 2019-01-14 NOTE — Telephone Encounter (Signed)
Rx refill of ativan sent to pt pharmacy.

## 2019-02-12 ENCOUNTER — Other Ambulatory Visit: Payer: Self-pay | Admitting: Medical

## 2019-02-12 MED ORDER — CITALOPRAM HYDROBROMIDE 40 MG PO TABS: 40.0000 mg | ORAL_TABLET | Freq: Every day | ORAL | 0 refills | Status: DC

## 2019-02-12 MED ORDER — LORAZEPAM 0.5 MG PO TABS: 0.5000 mg | ORAL_TABLET | Freq: Two times a day (BID) | ORAL | 0 refills | Status: DC

## 2019-02-12 NOTE — Telephone Encounter (Signed)
Refill Request:Lorazepam   Last RX:01/14/18 Last OV:11/13/18 Next MQ:KMMN scheduled UDS:10/28/18 CSC:11/11/18 CSR:

## 2019-02-12 NOTE — Telephone Encounter (Signed)
I shredded print prescription of ativan earlier today.

## 2019-02-12 NOTE — Telephone Encounter (Signed)
I refilled pt ativan and celxa. Reviewed CSR website today. He has to come in next month otherwise can't refill his ativan.

## 2019-02-12 NOTE — Telephone Encounter (Signed)
Pt due for follow up please call and schedule appointment.  

## 2019-02-12 NOTE — Telephone Encounter (Signed)
Called pt left msg to call back and set up appt before any other refill can be sent

## 2019-02-27 ENCOUNTER — Ambulatory Visit: Payer: BC Managed Care – PPO | Admitting: Medical

## 2019-02-27 ENCOUNTER — Encounter: Payer: Self-pay | Admitting: Medical

## 2019-02-27 VITALS — BP 110/66 | HR 88 | Temp 97.8°F | Resp 16 | Ht 69.0 in | Wt 204.6 lb

## 2019-02-27 DIAGNOSIS — I10 Essential (primary) hypertension: Secondary | ICD-10-CM | POA: Diagnosis not present

## 2019-02-27 DIAGNOSIS — F419 Anxiety disorder, unspecified: Secondary | ICD-10-CM | POA: Diagnosis not present

## 2019-02-27 DIAGNOSIS — Z720 Tobacco use: Secondary | ICD-10-CM

## 2019-02-27 DIAGNOSIS — J441 Chronic obstructive pulmonary disease with (acute) exacerbation: Secondary | ICD-10-CM

## 2019-02-27 DIAGNOSIS — E785 Hyperlipidemia, unspecified: Secondary | ICD-10-CM

## 2019-02-27 NOTE — Patient Instructions (Signed)
Your blood pressure is well controlled today so continue current blood pressure meds,  You do have history of high cholesterol.  Want you to continue pravastatin.  Will put in future labs lipid panel and metabolic panel to be done fasting within the next couple weeks.  Please get scheduled to have it done.  For history of anxiety, will continue low-dose Ativan as you have used in the past.  You are up-to-date on your controlled medication contract and urine drug screen.  For history of COPD, continue current medications.  Advised to at least cut back on the number of cigarettes that she smokes.  If you want to reattempt efforts to stop smoking in the future let me know.  Follow-up in 5 months or sooner if needed.(Also may advise sooner follow-up after lab review.)

## 2019-02-27 NOTE — Progress Notes (Signed)
Subjective:    Patient ID: Trevor Mack, male    DOB: 09-07-1956, 63 y.o.   MRN: [id number]  HPI   Pt in for follow up.  On review he has high cholesterol. Pt is taking pravachol.  Pt has high bp. It is controlled..  Pt states today he is breathing well. He is on trelegy and has albuterol inhaler. He does smoke. He has tried everything in the past to stop per pt  And he states he  can't stop smoking.  Pt has history of anxiety in the past. He is on ativan 0.5 twice daily. Anxiety controlled. He has been on this for years filled by prior MD. Also on citalopram.    Review of Systems  Constitutional: Negative for chills, fatigue and fever.  Respiratory: Negative for cough, chest tightness, shortness of breath and wheezing.   Cardiovascular: Negative for chest pain and palpitations.  Gastrointestinal: Negative for abdominal distention, abdominal pain, diarrhea and vomiting.  Musculoskeletal: Negative for back pain, neck pain and neck stiffness.  Skin: Negative for rash.  Neurological: Negative for dizziness, seizures, weakness and light-headedness.  Hematological: Negative for adenopathy. Does not bruise/bleed easily.  Psychiatric/Behavioral: Negative for behavioral problems, confusion, sleep disturbance and suicidal ideas. The patient is nervous/anxious.     Past Medical History:  Diagnosis Date  . ANXIETY 07/23/2008  . COLONIC POLYPS, HX OF 03/26/2006   MULTIPLE FRAGMENTS OF TUBULAR ADENOMAS POLYPS  . Diverticulosis   . Family history of colon cancer    Father   . GERD 07/23/2008  . Hepatitis C   . HYPERLIPIDEMIA 07/23/2008  . HYPERTENSION 07/23/2008  . TENDINITIS, LEFT THUMB 07/15/2010     Social History   Socioeconomic History  . Marital status: Widowed    Spouse name: Not on file  . Number of children: 0  . Years of education: Not on file  . Highest education level: Not on file  Occupational History    Employer: [employer]  Social Needs  .  Financial resource strain: Not on file  . Food insecurity:    Worry: Not on file    Inability: Not on file  . Transportation needs:    Medical: Not on file    Non-medical: Not on file  Tobacco Use  . Smoking status: Current Every Day Smoker    Packs/day: 1.50    Years: 35.00    Pack years: 52.50    Types: Cigarettes  . Smokeless tobacco: Never Used  . Tobacco comment: Started smoking at age 24.  Currently smoking 1 1/2 ppd.  Substance and Sexual Activity  . Alcohol use: No  . Drug use: No  . Sexual activity: Not on file  Lifestyle  . Physical activity:    Days per week: Not on file    Minutes per session: Not on file  . Stress: Not on file  Relationships  . Social connections:    Talks on phone: Not on file    Gets together: Not on file    Attends religious service: Not on file    Active member of club or organization: Not on file    Attends meetings of clubs or organizations: Not on file    Relationship status: Not on file  . Intimate partner violence:    Fear of current or ex partner: Not on file    Emotionally abused: Not on file    Physically abused: Not on file    Forced sexual activity: Not on file  Other Topics Concern  . Not on file  Social History Narrative  . Not on file    Past Surgical History:  Procedure Laterality Date  . LUMBAR LAMINECTOMY      Family History  Problem Relation Age of Onset  . Colon cancer Father   . Hypertension Father   . Diabetes Father   . Heart attack Father     No Known Allergies  Current Outpatient Medications on File Prior to Visit  Medication Sig Dispense Refill  . albuterol (PROVENTIL HFA;VENTOLIN HFA) 108 (90 Base) MCG/ACT inhaler     . albuterol (PROVENTIL) (2.5 MG/3ML) 0.083% nebulizer solution Take 3 mLs (2.5 mg total) by nebulization every 6 (six) hours as needed for wheezing or shortness of breath. Dx code: J44.9 75 mL 11  . amLODipine (NORVASC) 5 MG tablet Take 1 tablet (5 mg total) by mouth daily. 30 tablet  3  . azithromycin (ZITHROMAX) 250 MG tablet Take 2 tablets by mouth on day 1, followed by 1 tablet by mouth daily for 4 days. 6 tablet 0  . benzonatate (TESSALON) 100 MG capsule Take 1 capsule (100 mg total) by mouth 3 (three) times daily as needed for cough. 30 capsule 0  . citalopram (CELEXA) 40 MG tablet Take 1 tablet (40 mg total) by mouth daily. 30 tablet 0  . Fluticasone-Umeclidin-Vilant (TRELEGY ELLIPTA) 100-62.5-25 MCG/INH AEPB Inhale 1 puff into the lungs daily. 60 each 11  . ibuprofen (ADVIL) 200 MG tablet Take 400 mg by mouth every 6 (six) hours as needed.    Marland Kitchen LORazepam (ATIVAN) 0.5 MG tablet Take 1 tablet (0.5 mg total) by mouth 2 (two) times daily. 60 tablet 0  . losartan (COZAAR) 100 MG tablet TAKE 1 TABLET DAILY. NEEDS PHYSICAL 90 tablet 1  . omeprazole (PRILOSEC) 40 MG capsule Take 1 capsule (40 mg total) by mouth daily. 90 capsule 3  . pravastatin (PRAVACHOL) 40 MG tablet TAKE 1 TABLET DAILY 90 tablet 1  . predniSONE (DELTASONE) 10 MG tablet 6 TAB PO DAY 1 5 TAB PO DAY 2 4 TAB PO DAY 3 3 TAB PO DAY 4 2 TAB PO DAY 5 1 TAB PO DAY 6 21 tablet 0  . tamsulosin (FLOMAX) 0.4 MG CAPS capsule Take 1 capsule (0.4 mg total) by mouth daily. 90 capsule 3   No current facility-administered medications on file prior to visit.     BP 110/66   Pulse 88   Temp 97.8 F (36.6 C) (Oral)   Resp 16   Ht 5\' 9"  (1.753 m)   Wt 204 lb 9.6 oz (92.8 kg)   SpO2 97%   BMI 30.21 kg/m       Objective:   Physical Exam  General Mental Status- Alert. General Appearance- Not in acute distress.   Skin General: Color- Normal Color. Moisture- Normal Moisture.  Neck Carotid Arteries- Normal color. Moisture- Normal Moisture. No carotid bruits. No JVD.  Chest and Lung Exam Auscultation: Breath Sounds:-Normal.  Cardiovascular Auscultation:Rythm- Regular. Murmurs & Other Heart Sounds:Auscultation of the heart reveals- No Murmurs.  Abdomen Inspection:-Inspeection  Normal. Palpation/Percussion:Note:No mass. Palpation and Percussion of the abdomen reveal- Non Tender, Non Distended + BS, no rebound or guarding.    Neurologic Cranial Nerve exam:- CN III-XII intact(No nystagmus), symmetric smile. Strength:- 5/5 equal and symmetric strength both upper and lower extremities.      Assessment & Plan:  Your blood pressure is well controlled today so continue current blood pressure meds,  You do have history of  high cholesterol.  Want you to continue pravastatin.  Will put in future labs lipid panel and metabolic panel to be done fasting within the next couple weeks.  Please get scheduled to have it done.  For history of anxiety, will continue low-dose Ativan as you have used in the past.  You are up-to-date on your controlled medication contract and urine drug screen.  For history of COPD, continue current medications.  Advised to at least cut back on the number of cigarettes that she smokes.  If you want to reattempt efforts to stop smoking in the future let me know.  Follow-up in 5 months or sooner if needed.(Also may advise sooner follow-up after lab review.)  Mackie Pai, PA-C

## 2019-03-05 ENCOUNTER — Other Ambulatory Visit (INDEPENDENT_AMBULATORY_CARE_PROVIDER_SITE_OTHER): Payer: BC Managed Care – PPO

## 2019-03-05 ENCOUNTER — Other Ambulatory Visit: Payer: Self-pay

## 2019-03-05 DIAGNOSIS — E785 Hyperlipidemia, unspecified: Secondary | ICD-10-CM | POA: Diagnosis not present

## 2019-03-05 DIAGNOSIS — I10 Essential (primary) hypertension: Secondary | ICD-10-CM | POA: Diagnosis not present

## 2019-03-05 LAB — LIPID PANEL
Cholesterol: 187 mg/dL (ref 0–200)
HDL: 30.7 mg/dL — AB (ref >39.00)
NonHDL: 155.85
Total CHOL/HDL Ratio: 6
Triglycerides: 230 mg/dL — ABNORMAL HIGH (ref 0.0–149.0)
VLDL: 46 mg/dL — ABNORMAL HIGH (ref 0.0–40.0)

## 2019-03-05 LAB — COMPREHENSIVE METABOLIC PANEL
ALT: 24 U/L (ref 0–53)
AST: 17 U/L (ref 0–37)
Albumin: 4.6 g/dL (ref 3.5–5.2)
Alkaline Phosphatase: 114 U/L (ref 39–117)
BUN: 18 mg/dL (ref 6–23)
CALCIUM: 9 mg/dL (ref 8.4–10.5)
CO2: 28 mEq/L (ref 19–32)
Chloride: 99 mEq/L (ref 96–112)
Creatinine, Ser: 0.97 mg/dL (ref 0.40–1.50)
GFR: 78.46 mL/min (ref >60.00)
Glucose, Bld: 78 mg/dL (ref 70–99)
Potassium: 3.6 mEq/L (ref 3.5–5.1)
Sodium: 137 mEq/L (ref 135–145)
Total Bilirubin: 0.7 mg/dL (ref 0.2–1.2)
Total Protein: 7.3 g/dL (ref 6.0–8.3)

## 2019-03-05 LAB — LDL CHOLESTEROL, DIRECT: Direct LDL: 132 mg/dL

## 2019-03-10 ENCOUNTER — Telehealth: Payer: Self-pay | Admitting: Medical

## 2019-03-10 MED ORDER — ATORVASTATIN CALCIUM 40 MG PO TABS: 40.0000 mg | ORAL_TABLET | Freq: Every day | ORAL | 3 refills | Status: DC

## 2019-03-10 NOTE — Telephone Encounter (Signed)
Rx atorvastatin sent to pt pharmacy. 

## 2019-03-11 NOTE — Addendum Note (Signed)
Addended byDamita Dunnings D on: 03/11/2019 07:50 AM   Modules accepted: Orders

## 2019-03-17 ENCOUNTER — Other Ambulatory Visit: Payer: Self-pay | Admitting: Medical

## 2019-03-17 NOTE — Telephone Encounter (Signed)
Rx lorazepam sen to pt pharmacy

## 2019-03-17 NOTE — Telephone Encounter (Signed)
Refill Request: Lorazepam   Last RX:02/12/19 Last OV:02/27/19 Next OV: 06/12/19 UDS:11/11/18 CSC:10/28/18 CSR:

## 2019-03-26 ENCOUNTER — Other Ambulatory Visit: Payer: Self-pay | Admitting: Medical

## 2019-03-26 ENCOUNTER — Other Ambulatory Visit: Payer: Self-pay | Admitting: Pulmonary Disease

## 2019-03-26 DIAGNOSIS — J441 Chronic obstructive pulmonary disease with (acute) exacerbation: Secondary | ICD-10-CM

## 2019-04-15 ENCOUNTER — Other Ambulatory Visit: Payer: Self-pay | Admitting: Medical

## 2019-04-16 NOTE — Telephone Encounter (Signed)
Refill Request: Lorazepam   Last RX:03/17/19 Last OV: 04/27/19 Next OV:06/12/19 UDS:10/28/18 CSC:11/11/18 CSR:

## 2019-04-16 NOTE — Telephone Encounter (Signed)
Rx ativan sent to pt pharmacy.  

## 2019-04-28 ENCOUNTER — Other Ambulatory Visit: Payer: Self-pay | Admitting: Medical

## 2019-05-14 ENCOUNTER — Other Ambulatory Visit: Payer: Self-pay | Admitting: Medical

## 2019-05-15 NOTE — Telephone Encounter (Signed)
Rx lorazepam sent to pt pharmacy.

## 2019-05-15 NOTE — Telephone Encounter (Signed)
Refill Request: Lorazepam  Last RX:04/16/19 Last OV:02/27/19 Next OV:07/30/19 UDS:10/28/18 CSC:10/28/18 CSR:

## 2019-05-21 ENCOUNTER — Other Ambulatory Visit: Payer: Self-pay | Admitting: Medical

## 2019-06-03 ENCOUNTER — Telehealth: Payer: Self-pay | Admitting: Medical

## 2019-06-03 NOTE — Telephone Encounter (Signed)
Will you get pt scheduled for virtual visit. Want to discuss possible dose reduction of celexa. Potential medication side effect update sent to me by pharmacy.

## 2019-06-04 NOTE — Telephone Encounter (Signed)
Left pt a message to call back to schedule vv.

## 2019-06-12 ENCOUNTER — Other Ambulatory Visit: Payer: BC Managed Care – PPO

## 2019-06-17 ENCOUNTER — Other Ambulatory Visit: Payer: Self-pay | Admitting: Medical

## 2019-06-18 NOTE — Telephone Encounter (Signed)
Refill Request: Lorazepam   Last RX: 05/15/19 Last OV:02/27/19 Next OV: 07/30/19 UDS:11/13/18 CSC: 11/11/18 CSR:

## 2019-06-19 MED ORDER — LORAZEPAM 0.5 MG PO TABS: 0.5000 mg | ORAL_TABLET | Freq: Two times a day (BID) | ORAL | 0 refills | Status: DC

## 2019-06-19 NOTE — Telephone Encounter (Signed)
Dr. Lorelei Pont can you refill in PCP absence please?

## 2019-06-19 NOTE — Telephone Encounter (Signed)
Pt calling to check status. Pt states that he is completely out. Would like to know if another provider can fill. Pt would like a call back with an answer.  Please advise

## 2019-06-19 NOTE — Telephone Encounter (Signed)
Pt up to date on contract, uds and controlled med site review. Refilled rx today.  I just filled. So Dr. Edilia Bo does not need to fill.  Pt does need controlled medication visit. Can be virtual.

## 2019-06-20 ENCOUNTER — Other Ambulatory Visit: Payer: Self-pay | Admitting: Medical

## 2019-06-23 ENCOUNTER — Other Ambulatory Visit: Payer: Self-pay

## 2019-06-23 ENCOUNTER — Other Ambulatory Visit (INDEPENDENT_AMBULATORY_CARE_PROVIDER_SITE_OTHER): Payer: BC Managed Care – PPO

## 2019-06-23 ENCOUNTER — Other Ambulatory Visit: Payer: Self-pay | Admitting: Medical

## 2019-06-23 DIAGNOSIS — E785 Hyperlipidemia, unspecified: Secondary | ICD-10-CM

## 2019-06-23 LAB — COMPREHENSIVE METABOLIC PANEL
ALT: 25 U/L (ref 0–53)
AST: 18 U/L (ref 0–37)
Albumin: 4.2 g/dL (ref 3.5–5.2)
Alkaline Phosphatase: 116 U/L (ref 39–117)
BUN: 13 mg/dL (ref 6–23)
CO2: 27 mEq/L (ref 19–32)
Calcium: 8.5 mg/dL (ref 8.4–10.5)
Chloride: 102 mEq/L (ref 96–112)
Creatinine, Ser: 0.96 mg/dL (ref 0.40–1.50)
GFR: 79.32 mL/min (ref >60.00)
Glucose, Bld: 91 mg/dL (ref 70–99)
Potassium: 3.6 mEq/L (ref 3.5–5.1)
Sodium: 140 mEq/L (ref 135–145)
Total Bilirubin: 0.6 mg/dL (ref 0.2–1.2)
Total Protein: 6.8 g/dL (ref 6.0–8.3)

## 2019-06-23 LAB — LIPID PANEL
Cholesterol: 130 mg/dL (ref 0–200)
HDL: 30.5 mg/dL — ABNORMAL LOW (ref >39.00)
LDL Cholesterol: 72 mg/dL (ref 0–99)
NonHDL: 99.75
Total CHOL/HDL Ratio: 4
Triglycerides: 137 mg/dL (ref 0.0–149.0)
VLDL: 27.4 mg/dL (ref 0.0–40.0)

## 2019-06-24 ENCOUNTER — Ambulatory Visit: Payer: Self-pay

## 2019-06-24 NOTE — Telephone Encounter (Signed)
Pt.  Calling for lab results .Marland Kitchen Phone call to Pt.  No answers l/m for return call.   Answer Assessment - Initial Assessment Questions 1. REASON FOR CALL or QUESTION: "What is your reason for calling today?" or "How can I best help you?" or "What question do you have that I can help answer?"     Lab results 2. CALLER: Document the source of call. (e.g., laboratory, patient).     Patient.  Protocols used: PCP CALL - NO TRIAGE-A-AH

## 2019-07-17 ENCOUNTER — Other Ambulatory Visit: Payer: Self-pay | Admitting: Medical

## 2019-07-17 NOTE — Telephone Encounter (Signed)
Refill Request: Lorazepam  Last RX:06/19/19 Last OV:02/27/19 Next OV:08/12/19 UDS:11/02/19 CSC:11/02/19 CSR:

## 2019-07-17 NOTE — Telephone Encounter (Signed)
Reviewed controlled med site today.  I sent in pt 2 week rx of lorazepam.  He needs controlled medication visit with me. Virtual video or phone

## 2019-07-17 NOTE — Telephone Encounter (Signed)
Pt has apt scheduled 08/12/19

## 2019-07-24 ENCOUNTER — Other Ambulatory Visit: Payer: Self-pay | Admitting: Medical

## 2019-07-29 ENCOUNTER — Other Ambulatory Visit: Payer: Self-pay | Admitting: Medical

## 2019-07-30 ENCOUNTER — Ambulatory Visit: Payer: BC Managed Care – PPO | Admitting: Medical

## 2019-08-01 ENCOUNTER — Other Ambulatory Visit: Payer: Self-pay | Admitting: Emergency Medicine

## 2019-08-01 NOTE — Telephone Encounter (Signed)
Patient stated he is out of medication and needs a refill the amount sent was not enough to last him  Until his appointment   [place]

## 2019-08-04 NOTE — Telephone Encounter (Signed)
Pt stated he was not given enough medication to last him until his appt on 08/12/19. Pt requests call back

## 2019-08-05 MED ORDER — LORAZEPAM 0.5 MG PO TABS: 0.5000 mg | ORAL_TABLET | Freq: Two times a day (BID) | ORAL | 0 refills | Status: DC

## 2019-08-05 NOTE — Telephone Encounter (Signed)
I sent in 2 weeks rx of ativan. Make sure he keeps appointment this month. Will discuss amount he needs per month on that visit.

## 2019-08-05 NOTE — Addendum Note (Signed)
Addended by: Hinton Dyer on: 08/05/2019 08:49 AM   Modules accepted: Orders

## 2019-08-05 NOTE — Addendum Note (Signed)
Addended by: Anabel Halon on: 08/05/2019 09:36 AM   Modules accepted: Orders

## 2019-08-11 ENCOUNTER — Other Ambulatory Visit: Payer: Self-pay

## 2019-08-12 ENCOUNTER — Encounter: Payer: Self-pay | Admitting: Medical

## 2019-08-12 ENCOUNTER — Telehealth: Payer: Self-pay | Admitting: Medical

## 2019-08-12 ENCOUNTER — Ambulatory Visit (INDEPENDENT_AMBULATORY_CARE_PROVIDER_SITE_OTHER): Payer: BC Managed Care – PPO | Admitting: Medical

## 2019-08-12 VITALS — BP 140/80 | HR 87 | Temp 97.9°F | Resp 16 | Ht 69.0 in | Wt 212.8 lb

## 2019-08-12 DIAGNOSIS — R739 Hyperglycemia, unspecified: Secondary | ICD-10-CM

## 2019-08-12 DIAGNOSIS — F419 Anxiety disorder, unspecified: Secondary | ICD-10-CM | POA: Diagnosis not present

## 2019-08-12 DIAGNOSIS — N401 Enlarged prostate with lower urinary tract symptoms: Secondary | ICD-10-CM

## 2019-08-12 DIAGNOSIS — R972 Elevated prostate specific antigen [PSA]: Secondary | ICD-10-CM

## 2019-08-12 DIAGNOSIS — R350 Frequency of micturition: Secondary | ICD-10-CM | POA: Diagnosis not present

## 2019-08-12 DIAGNOSIS — I10 Essential (primary) hypertension: Secondary | ICD-10-CM | POA: Diagnosis not present

## 2019-08-12 LAB — HEMOGLOBIN A1C: Hgb A1c MFr Bld: 5.8 % (ref 4.6–6.5)

## 2019-08-12 LAB — PSA: PSA: 3.1 ng/mL (ref 0.10–4.00)

## 2019-08-12 MED ORDER — TAMSULOSIN HCL 0.4 MG PO CAPS: 0.4000 mg | ORAL_CAPSULE | Freq: Every day | ORAL | 3 refills | Status: AC

## 2019-08-12 MED ORDER — LORAZEPAM 0.5 MG PO TABS: 0.5000 mg | ORAL_TABLET | Freq: Two times a day (BID) | ORAL | 1 refills | Status: DC

## 2019-08-12 NOTE — Progress Notes (Signed)
Subjective:    Patient ID: Trevor Mack, male    DOB: 09-24-1956, 63 y.o.   MRN: [id number]  HPI  Pt in for follow up.  Pt has hx of anxiety. He was on benzo before he saw me. He saw psychiatrist since he established with me and he was switched to ativan. Pt states he needs refill. Pt states he eventually wanted to come off medication so I wrote him only 30 tablets but he states needs to be on 60 tablets now. Up to date on contract, uds and controlled med site review.  Pt states he has been frequently urinating at night. Pt was on flomax in past.   Pt bp is high today. He did not check bp medication this morning.  Pt has high cholesterol.   Pt has hx of mild elevated.    Review of Systems  Constitutional: Negative for appetite change, chills, fatigue and fever.  HENT: Negative for congestion, ear pain and facial swelling.   Respiratory: Negative for cough, chest tightness, shortness of breath and wheezing.   Cardiovascular: Negative for chest pain and palpitations.  Gastrointestinal: Negative for abdominal pain and blood in stool.  Musculoskeletal: Negative for back pain and gait problem.  Skin: Negative for rash.  Hematological: Negative for adenopathy. Does not bruise/bleed easily.  Psychiatric/Behavioral: Negative for agitation, confusion, dysphoric mood, hallucinations and suicidal ideas. The patient is nervous/anxious.     Past Medical History:  Diagnosis Date  . ANXIETY 07/23/2008  . COLONIC POLYPS, HX OF 03/26/2006   MULTIPLE FRAGMENTS OF TUBULAR ADENOMAS POLYPS  . Diverticulosis   . Family history of colon cancer    Father   . GERD 07/23/2008  . Hepatitis C   . HYPERLIPIDEMIA 07/23/2008  . HYPERTENSION 07/23/2008  . TENDINITIS, LEFT THUMB 07/15/2010     Social History   Socioeconomic History  . Marital status: Widowed    Spouse name: Not on file  . Number of children: 0  . Years of education: Not on file  . Highest education level: Not on file   Occupational History    Employer: [employer]  Social Needs  . Financial resource strain: Not on file  . Food insecurity    Worry: Not on file    Inability: Not on file  . Transportation needs    Medical: Not on file    Non-medical: Not on file  Tobacco Use  . Smoking status: Current Every Day Smoker    Packs/day: 1.50    Years: 35.00    Pack years: 52.50    Types: Cigarettes  . Smokeless tobacco: Never Used  . Tobacco comment: Started smoking at age 31.  Currently smoking 1 1/2 ppd.  Substance and Sexual Activity  . Alcohol use: No  . Drug use: No  . Sexual activity: Not on file  Lifestyle  . Physical activity    Days per week: Not on file    Minutes per session: Not on file  . Stress: Not on file  Relationships  . Social Herbalist on phone: Not on file    Gets together: Not on file    Attends religious service: Not on file    Active member of club or organization: Not on file    Attends meetings of clubs or organizations: Not on file    Relationship status: Not on file  . Intimate partner violence    Fear of current or ex partner: Not on file  Emotionally abused: Not on file    Physically abused: Not on file    Forced sexual activity: Not on file  Other Topics Concern  . Not on file  Social History Narrative  . Not on file    Past Surgical History:  Procedure Laterality Date  . LUMBAR LAMINECTOMY      Family History  Problem Relation Age of Onset  . Colon cancer Father   . Hypertension Father   . Diabetes Father   . Heart attack Father     No Known Allergies  Current Outpatient Medications on File Prior to Visit  Medication Sig Dispense Refill  . albuterol (PROVENTIL HFA;VENTOLIN HFA) 108 (90 Base) MCG/ACT inhaler     . albuterol (PROVENTIL) (2.5 MG/3ML) 0.083% nebulizer solution Take 3 mLs (2.5 mg total) by nebulization every 6 (six) hours as needed for wheezing or shortness of breath. Dx code: J44.9 75 mL 11  . amLODipine  (NORVASC) 5 MG tablet Take 1 tablet by mouth once daily 90 tablet 0  . atorvastatin (LIPITOR) 40 MG tablet Take 1 tablet by mouth once daily 90 tablet 0  . azithromycin (ZITHROMAX) 250 MG tablet Take 2 tablets by mouth on day 1, followed by 1 tablet by mouth daily for 4 days. 6 tablet 0  . benzonatate (TESSALON) 100 MG capsule Take 1 capsule (100 mg total) by mouth 3 (three) times daily as needed for cough. 30 capsule 0  . citalopram (CELEXA) 40 MG tablet Take 1 tablet by mouth once daily 30 tablet 0  . ibuprofen (ADVIL) 200 MG tablet Take 400 mg by mouth every 6 (six) hours as needed.    Marland Kitchen LORazepam (ATIVAN) 0.5 MG tablet Take 1 tablet (0.5 mg total) by mouth 2 (two) times daily. 30 tablet 0  . losartan (COZAAR) 100 MG tablet Take 1 tablet by mouth once daily 90 tablet 0  . omeprazole (PRILOSEC) 40 MG capsule Take 1 capsule (40 mg total) by mouth daily. 90 capsule 3  . predniSONE (DELTASONE) 10 MG tablet 6 TAB PO DAY 1 5 TAB PO DAY 2 4 TAB PO DAY 3 3 TAB PO DAY 4 2 TAB PO DAY 5 1 TAB PO DAY 6 21 tablet 0  . tamsulosin (FLOMAX) 0.4 MG CAPS capsule Take 1 capsule (0.4 mg total) by mouth daily. 90 capsule 3  . TRELEGY ELLIPTA 100-62.5-25 MCG/INH AEPB INHALE 1 PUFF INTO LUNGS ONCE DAILY 60 each 5   No current facility-administered medications on file prior to visit.     BP (!) 163/75   Pulse 87   Temp 97.9 F (36.6 C) (Temporal)   Resp 16   Ht 5\' 9"  (1.753 m)   Wt 212 lb 12.8 oz (96.5 kg)   SpO2 97%   BMI 31.43 kg/m       Objective:   Physical Exam  General Mental Status- Alert. General Appearance- Not in acute distress.   Skin General: Color- Normal Color. Moisture- Normal Moisture.  Neck Carotid Arteries- Normal color. Moisture- Normal Moisture. No carotid bruits. No JVD.  Chest and Lung Exam Auscultation: Breath Sounds:-Normal.  Cardiovascular Auscultation:Rythm- Regular. Murmurs & Other Heart Sounds:Auscultation of the heart reveals- No Murmurs. logic Cranial  Nerve exam:- CN III-XII intact(No nystagmus), symmetric smile. Strength:- 5/5 equal and symmetric strength both upper and lower extremities.      Assessment & Plan:  For your history of anxiety, I will go ahead and refill your Ativan at 60 tablets of volume since you have been  on that in the past.  You had expressed desire to taper off in the past but that is not going well presently.  We can try to taper you off in the future when you are ready.  For history of BPH and recurrent frequent urination after being off of your Flomax, going to get PSA level today and urine culture.  I will refill the Flomax.  Blood pressure is borderline elevated today but you are not on your medication.  Please take your medication daily.  For history of elevated sugar in the past and family history of diabetes, will get A1c today.  Reminder to get flu vaccine within the next 1 to 2 months.  This year recommend sooner rather than later.  Follow-up date to be determined after lab review.  25 minutes spent with pt. 50% of time spent counseling pt on dx and tx.  Mackie Pai, PA-C

## 2019-08-12 NOTE — Patient Instructions (Signed)
For your history of anxiety, I will go ahead and refill your Ativan at 60 tablets of volume since you have been on that in the past.  You had expressed desire to taper off in the past but that is not going well presently.  We can try to taper you off in the future when you are ready.  For history of BPH and recurrent frequent urination after being off of your Flomax, going to get PSA level today and urine culture.  I will refill the Flomax.  Blood pressure is borderline elevated today but you are not on your medication.  Please take your medication daily.  For history of elevated sugar in the past and family history of diabetes, will get A1c today.  Reminder to get flu vaccine within the next 1 to 2 months.  This year recommend sooner rather than later.  Follow-up date to be determined after lab review.

## 2019-08-12 NOTE — Telephone Encounter (Signed)
Referral to urologist placed. 

## 2019-08-14 LAB — URINE CULTURE
MICRO NUMBER:: 783461
Result:: NO GROWTH
SPECIMEN QUALITY:: ADEQUATE

## 2019-08-26 ENCOUNTER — Other Ambulatory Visit: Payer: Self-pay | Admitting: Medical

## 2019-08-27 ENCOUNTER — Other Ambulatory Visit: Payer: Self-pay | Admitting: Medical

## 2019-09-22 ENCOUNTER — Other Ambulatory Visit: Payer: Self-pay | Admitting: Medical

## 2019-10-16 ENCOUNTER — Telehealth: Payer: Self-pay | Admitting: Medical

## 2019-10-16 NOTE — Telephone Encounter (Addendum)
Refill Request: Lorazepam  Last RX:08/12/19 Last OV:08/12/19 Next [id number]:[id number] scheduled  UDS:11/11/18 CSC:11/11/18 CSR:10/20/2019   Please get him scheduled for uds and update contract signing.   Refilled med today.

## 2019-10-20 ENCOUNTER — Other Ambulatory Visit: Payer: Self-pay | Admitting: Medical

## 2019-10-20 NOTE — Telephone Encounter (Signed)
Patient is calling again to check the status of his refill request.  Please advise and call to discuss at [phone]

## 2019-10-20 NOTE — Telephone Encounter (Signed)
Pt called to report that he is still missing his medication he says he is completely out. Pt needs refill please advise. Transmission to pharmacy failed.

## 2019-10-21 MED ORDER — LORAZEPAM 0.5 MG PO TABS: 0.5000 mg | ORAL_TABLET | Freq: Two times a day (BID) | ORAL | 0 refills | Status: DC

## 2019-10-21 NOTE — Telephone Encounter (Signed)
Resent ativan to pt pharmacy.

## 2019-10-21 NOTE — Telephone Encounter (Signed)
They did not get it can you resend.

## 2019-10-21 NOTE — Telephone Encounter (Signed)
Can you verify that transmission to pharmacy failed. That is very unusual??  Let me know and then can resend.

## 2019-10-27 ENCOUNTER — Other Ambulatory Visit: Payer: Self-pay | Admitting: Medical

## 2019-11-01 ENCOUNTER — Other Ambulatory Visit: Payer: Self-pay | Admitting: Medical

## 2019-11-04 ENCOUNTER — Other Ambulatory Visit: Payer: Self-pay | Admitting: Medical

## 2019-11-05 ENCOUNTER — Other Ambulatory Visit: Payer: Self-pay | Admitting: Medical

## 2019-11-10 ENCOUNTER — Other Ambulatory Visit: Payer: Self-pay | Admitting: Pulmonary Disease

## 2019-11-10 DIAGNOSIS — J441 Chronic obstructive pulmonary disease with (acute) exacerbation: Secondary | ICD-10-CM

## 2019-11-11 ENCOUNTER — Other Ambulatory Visit: Payer: Self-pay | Admitting: Pulmonary Disease

## 2019-11-11 DIAGNOSIS — J441 Chronic obstructive pulmonary disease with (acute) exacerbation: Secondary | ICD-10-CM

## 2019-11-12 ENCOUNTER — Other Ambulatory Visit: Payer: Self-pay | Admitting: Pulmonary Disease

## 2019-11-12 DIAGNOSIS — J441 Chronic obstructive pulmonary disease with (acute) exacerbation: Secondary | ICD-10-CM

## 2019-11-14 ENCOUNTER — Telehealth: Payer: Self-pay | Admitting: Medical

## 2019-11-14 ENCOUNTER — Other Ambulatory Visit: Payer: Self-pay | Admitting: Medical

## 2019-11-14 DIAGNOSIS — J441 Chronic obstructive pulmonary disease with (acute) exacerbation: Secondary | ICD-10-CM

## 2019-11-14 NOTE — Telephone Encounter (Signed)
Requested medication (s) are due for refill today: yes  Requested medication (s) are on the active medication list: yes  Last refill:  03/26/2019  Future visit scheduled: no  Notes to clinic: last filled by different provider  Review for refill  Requested Prescriptions  Pending Prescriptions Disp Refills   Fluticasone-Umeclidin-Vilant (TRELEGY ELLIPTA) 100-62.5-25 MCG/INH AEPB 60 each 5     Off-Protocol Failed - 11/14/2019  1:33 PM      Failed - Medication not assigned to a protocol, review manually.      Passed - Valid encounter within last 12 months    Recent Outpatient Visits          3 months ago Essential hypertension   [facility] [place], [place]   8 months ago Essential hypertension   [facility] at [place], [place]   1 year ago [facility] [place], [place]   1 year ago [facility] at [place], [place]   1 year ago Encounter for preventive health examination   [facility], Doretha Sou, MD

## 2019-11-14 NOTE — Telephone Encounter (Signed)
Medication: TRELEGY ELLIPTA 100-62.5-25 MCG/INH AEPB [id number]   Has the patient contacted their pharmacy? Yes  (Agent: If no, request that the patient contact the pharmacy for the refill.) (Agent: If yes, when and what did the pharmacy advise?)  Preferred Pharmacy (with phone number or street name): [facility] [phone] (Phone) [phone] (Fax)

## 2019-11-14 NOTE — Telephone Encounter (Signed)
I got a request to fill pt ellipta. Pulmonologist rx'd and told him to follow up. He should have them refill. I tried to refill with epic and issues with sig?  If pulmonologist denies then you can call in one month refill but have him follow up with pulmonologsit before one month refill runs out.

## 2019-11-17 NOTE — Telephone Encounter (Signed)
Left pt a message to call back. 

## 2019-11-17 NOTE — Telephone Encounter (Signed)
Pt says that he is requesting Trelegy, pt says that he is having a time getting in touch with his pulmonologist to get refill.

## 2019-11-18 ENCOUNTER — Telehealth: Payer: Self-pay | Admitting: *Deleted

## 2019-11-18 ENCOUNTER — Ambulatory Visit: Payer: Self-pay | Admitting: *Deleted

## 2019-11-18 MED ORDER — TRELEGY ELLIPTA 100-62.5-25 MCG/INH IN AEPB: 1.0000 | INHALATION_SPRAY | Freq: Every day | RESPIRATORY_TRACT | 0 refills | Status: AC

## 2019-11-18 NOTE — Telephone Encounter (Signed)
Would you call his pharmacy and give him one refill of trelegy. Not on his list. I tried to fill in epic and was giving me problems.

## 2019-11-18 NOTE — Telephone Encounter (Signed)
Was on trilegy and prescription has run out. He is calling to check on his prescription. He is wheezing and short of breath. Advised that he may have to go to an urgent care for his breathing. He stated that his breathing is not that bad yet. It looks like his prescription has been sent out today. Will check with the pharmacy and call him back. Routing to the [facility] at [facility] for review.

## 2019-11-18 NOTE — Addendum Note (Signed)
Addended by: Hinton Dyer on: 11/18/2019 11:15 AM   Modules accepted: Orders

## 2019-11-18 NOTE — Telephone Encounter (Signed)
Sent rx to pharmacy

## 2019-11-18 NOTE — Telephone Encounter (Signed)
Patient has been calling in for a couple of days now and haven't received medication refill . Patient is having a hard time breathing and will need medication asap  Fluticasone-Umeclidin-Vilant (TRELEGY ELLIPTA) 100-62.5-25 MCG/INH AEPB [id number]      Please advise

## 2019-11-18 NOTE — Telephone Encounter (Signed)
[facility] called in regards to refill for Trelegy. Pharmacist stated that he would get the medication ready for pickup. Will notify the patient.  Pt notified. He is also advised that if his breathing gets worst to go to the UC or ED. He voiced understanding.

## 2019-12-04 ENCOUNTER — Telehealth: Payer: Self-pay | Admitting: Medical

## 2019-12-04 ENCOUNTER — Other Ambulatory Visit: Payer: Self-pay | Admitting: Medical

## 2019-12-04 MED ORDER — CITALOPRAM HYDROBROMIDE 40 MG PO TABS: 40.0000 mg | ORAL_TABLET | Freq: Every day | ORAL | 3 refills | Status: DC

## 2019-12-04 NOTE — Telephone Encounter (Signed)
I went ahead and sent refill of celexa then saw that Nira Conn had already sent. So can you call pharmacy and cancel out my rx last sent or Heathers sent. They probably will cancel mine if heather already filled?

## 2019-12-04 NOTE — Telephone Encounter (Signed)
Medication: citalopram (CELEXA) 40 MG tablet [id number]   Has the patient contacted their pharmacy? yes (Agent: If no, request that the patient contact the pharmacy for the refill.) (Agent: If yes, when and what did the pharmacy advise?)  Preferred Pharmacy (with phone number or street name): [facility]  Phone:  [phone] Fax:  [phone]     Agent: Please be advised that RX refills may take up to 3 business days. We ask that you follow-up with your pharmacy.

## 2019-12-04 NOTE — Addendum Note (Signed)
Addended by: Anabel Halon on: 12/04/2019 08:42 PM   Modules accepted: Orders

## 2019-12-04 NOTE — Telephone Encounter (Signed)
Refill sent.

## 2019-12-05 NOTE — Telephone Encounter (Signed)
Rx cancelled.

## 2019-12-16 ENCOUNTER — Other Ambulatory Visit: Payer: Self-pay | Admitting: Medical

## 2019-12-17 ENCOUNTER — Telehealth: Payer: Self-pay | Admitting: *Deleted

## 2019-12-17 MED ORDER — TRELEGY ELLIPTA 100-62.5-25 MCG/INH IN AEPB: 1.0000 | INHALATION_SPRAY | Freq: Every day | RESPIRATORY_TRACT | 0 refills | Status: DC

## 2019-12-17 NOTE — Telephone Encounter (Signed)
Received a refill request from [facility] for Trelegy inhaler and it had Dr. Lake Bells name on it.  I called to verify with [facility] that they sent to correct pharmacy and they stated that they just got off the phone with Dr. Kathee Delton office and they said to send to our office for refills.  Trelegy 100-62.5-25  1 puff daily  Ok to refill?

## 2019-12-22 ENCOUNTER — Other Ambulatory Visit: Payer: Self-pay | Admitting: Medical

## 2020-01-12 ENCOUNTER — Other Ambulatory Visit: Payer: Self-pay

## 2020-01-12 ENCOUNTER — Encounter: Payer: Self-pay | Admitting: Medical

## 2020-01-12 ENCOUNTER — Ambulatory Visit (INDEPENDENT_AMBULATORY_CARE_PROVIDER_SITE_OTHER): Payer: BC Managed Care – PPO | Admitting: Medical

## 2020-01-12 VITALS — BP 140/76 | HR 92 | Temp 97.1°F | Resp 16 | Ht 69.0 in | Wt 217.4 lb

## 2020-01-12 DIAGNOSIS — J441 Chronic obstructive pulmonary disease with (acute) exacerbation: Secondary | ICD-10-CM | POA: Diagnosis not present

## 2020-01-12 DIAGNOSIS — R972 Elevated prostate specific antigen [PSA]: Secondary | ICD-10-CM

## 2020-01-12 DIAGNOSIS — I10 Essential (primary) hypertension: Secondary | ICD-10-CM

## 2020-01-12 DIAGNOSIS — F419 Anxiety disorder, unspecified: Secondary | ICD-10-CM

## 2020-01-12 DIAGNOSIS — E785 Hyperlipidemia, unspecified: Secondary | ICD-10-CM

## 2020-01-12 LAB — PSA: PSA: 3.18 ng/mL (ref 0.10–4.00)

## 2020-01-12 MED ORDER — TRELEGY ELLIPTA 100-62.5-25 MCG/INH IN AEPB: 1.0000 | INHALATION_SPRAY | Freq: Every day | RESPIRATORY_TRACT | 3 refills | Status: DC

## 2020-01-12 NOTE — Progress Notes (Signed)
Subjective:    Patient ID: Trevor Mack, male    DOB: 13-May-1956, 64 y.o.   MRN: [id number]  HPI  Pt in for follow up.  Pt bp level relatively contolled today.  Pt has copd history. Pt in past saw Dr. Lake Bells. Pt is on trelegy.Pt still smokes.   Pt has high cholesterol.  Pt is on lipitor.  Pt is on ativan for anxiety. He is due for controlled med contract update and uds.  Elevated psa velocity in the past. He states delay in urologist referral. He got some pill otc and urologist called pt at 3 months. He cancelled his appointment.  Review of Systems  Constitutional: Negative for chills, fatigue and fever.  Respiratory: Negative for cough, chest tightness, shortness of breath and wheezing.   Cardiovascular: Negative for chest pain and palpitations.  Gastrointestinal: Negative for abdominal pain and nausea.  Genitourinary: Negative for dysuria.  Musculoskeletal: Negative for back pain and myalgias.  Neurological: Negative for dizziness, speech difficulty, weakness and light-headedness.  Hematological: Positive for adenopathy.  Psychiatric/Behavioral: Negative for behavioral problems, decreased concentration, hallucinations, self-injury and suicidal ideas. The patient is nervous/anxious.        Daily some anxiety but controlled with ativan.    Past Medical History:  Diagnosis Date  . ANXIETY 07/23/2008  . COLONIC POLYPS, HX OF 03/26/2006   MULTIPLE FRAGMENTS OF TUBULAR ADENOMAS POLYPS  . Diverticulosis   . Family history of colon cancer    Father   . GERD 07/23/2008  . Hepatitis C   . HYPERLIPIDEMIA 07/23/2008  . HYPERTENSION 07/23/2008  . TENDINITIS, LEFT THUMB 07/15/2010     Social History   Socioeconomic History  . Marital status: Widowed    Spouse name: Not on file  . Number of children: 0  . Years of education: Not on file  . Highest education level: Not on file  Occupational History    Employer: [employer]  Tobacco Use  . Smoking status: Current  Every Day Smoker    Packs/day: 1.50    Years: 35.00    Pack years: 52.50    Types: Cigarettes  . Smokeless tobacco: Never Used  . Tobacco comment: Started smoking at age 51.  Currently smoking 1 1/2 ppd.  Substance and Sexual Activity  . Alcohol use: No  . Drug use: No  . Sexual activity: Not on file  Other Topics Concern  . Not on file  Social History Narrative  . Not on file   Social Determinants of Health   Financial Resource Strain:   . Difficulty of Paying Living Expenses: Not on file  Food Insecurity:   . Worried About Charity fundraiser in the Last Year: Not on file  . Ran Out of Food in the Last Year: Not on file  Transportation Needs:   . Lack of Transportation (Medical): Not on file  . Lack of Transportation (Non-Medical): Not on file  Physical Activity:   . Days of Exercise per Week: Not on file  . Minutes of Exercise per Session: Not on file  Stress:   . Feeling of Stress : Not on file  Social Connections:   . Frequency of Communication with Friends and Family: Not on file  . Frequency of Social Gatherings with Friends and Family: Not on file  . Attends Religious Services: Not on file  . Active Member of Clubs or Organizations: Not on file  . Attends Archivist Meetings: Not on file  . Marital  Status: Not on file  Intimate Partner Violence:   . Fear of Current or Ex-Partner: Not on file  . Emotionally Abused: Not on file  . Physically Abused: Not on file  . Sexually Abused: Not on file    Past Surgical History:  Procedure Laterality Date  . LUMBAR LAMINECTOMY      Family History  Problem Relation Age of Onset  . Colon cancer Father   . Hypertension Father   . Diabetes Father   . Heart attack Father     No Known Allergies  Current Outpatient Medications on File Prior to Visit  Medication Sig Dispense Refill  . albuterol (PROVENTIL HFA;VENTOLIN HFA) 108 (90 Base) MCG/ACT inhaler     . albuterol (PROVENTIL) (2.5 MG/3ML) 0.083%  nebulizer solution Take 3 mLs (2.5 mg total) by nebulization every 6 (six) hours as needed for wheezing or shortness of breath. Dx code: J44.9 75 mL 11  . amLODipine (NORVASC) 5 MG tablet Take 1 tablet by mouth once daily 90 tablet 0  . atorvastatin (LIPITOR) 40 MG tablet Take 1 tablet by mouth once daily 90 tablet 0  . azithromycin (ZITHROMAX) 250 MG tablet Take 250 mg by mouth daily. Take 2 tablets by mouth on day 1 followed by 1 tablet by mouth daily for 4 days    . benzonatate (TESSALON) 100 MG capsule Take 1 capsule (100 mg total) by mouth 3 (three) times daily as needed for cough. 30 capsule 0  . citalopram (CELEXA) 40 MG tablet Take 1 tablet (40 mg total) by mouth daily. 30 tablet 3  . ibuprofen (ADVIL) 200 MG tablet Take 400 mg by mouth every 6 (six) hours as needed.    Marland Kitchen LORazepam (ATIVAN) 0.5 MG tablet Take 1 tablet by mouth twice daily 60 tablet 0  . losartan (COZAAR) 100 MG tablet Take 1 tablet by mouth once daily 90 tablet 0  . omeprazole (PRILOSEC) 40 MG capsule Take 1 capsule (40 mg total) by mouth daily. 90 capsule 3  . predniSONE (DELTASONE) 10 MG tablet Take 10 mg by mouth daily with breakfast.    . tamsulosin (FLOMAX) 0.4 MG CAPS capsule Take 1 capsule (0.4 mg total) by mouth daily. 90 capsule 3   No current facility-administered medications on file prior to visit.    BP 140/76 (BP Location: Left Arm, Patient Position: Sitting, Cuff Size: Normal)   Pulse 92   Temp (!) 97.1 F (36.2 C) (Temporal)   Resp 16   Ht 5\' 9"  (1.753 m)   Wt 217 lb 6.4 oz (98.6 kg)   SpO2 96%   BMI 32.10 kg/m       Objective:   Physical Exam  General Mental Status- Alert. General Appearance- Not in acute distress.   Skin General: Color- Normal Color. Moisture- Normal Moisture.  Neck Carotid Arteries- Normal color. Moisture- Normal Moisture.No JVD.  Chest and Lung Exam Auscultation: Breath Sounds:-Normal.  Cardiovascular Auscultation:Rythm- Regular. Murmurs & Other Heart  Sounds:Auscultation of the heart reveals- No Murmurs.  Abdomen Inspection:-Inspeection Normal. Palpation/Percussion:Note:No mass. Palpation and Percussion of the abdomen reveal- Non Tender, Non Distended + BS, no rebound or guarding.    Neurologic Cranial Nerve exam:- CN III-XII intact(No nystagmus), symmetric smile. Strength:- 5/5 equal and symmetric strength both upper and lower extremities.      Assessment & Plan:  For increased prostate velocity hx in the past will get psa today. Then determine if refer to urologist as already did.  For copd refilled your trelegy.  For  htn, continue current bp med regimen.  For high cholesterol continue lipitor.  For anxiety hx and ativan use up date your uds and contract today.  Follow up 4 months or as needed  30 minutes spent with pt. 50% of time spent counseling pt on plan going forward.  Mackie Pai, PA-C

## 2020-01-12 NOTE — Patient Instructions (Addendum)
For increased prostate velocity hx in the past will get psa today. Then determine if refer to urologist as already did.  For copd refilled your trelegy.  For htn, continue current bp med regimen.  For high cholesterol continue lipitor.  For anxiety hx and ativan use up date your uds and contract today.  Follow up 4 months or as needed

## 2020-01-14 LAB — PAIN MGMT, PROFILE 8 W/CONF, U
6 Acetylmorphine: NEGATIVE ng/mL
Alcohol Metabolites: NEGATIVE ng/mL (ref <500)
Alphahydroxyalprazolam: NEGATIVE ng/mL
Alphahydroxymidazolam: NEGATIVE ng/mL
Alphahydroxytriazolam: NEGATIVE ng/mL
Aminoclonazepam: NEGATIVE ng/mL
Amphetamines: NEGATIVE ng/mL
Benzodiazepines: POSITIVE ng/mL
Buprenorphine, Urine: NEGATIVE ng/mL
Cocaine Metabolite: NEGATIVE ng/mL
Creatinine: >300 mg/dL
Hydroxyethylflurazepam: NEGATIVE ng/mL
Lorazepam: 1279 ng/mL
MDMA: NEGATIVE ng/mL
Marijuana Metabolite: NEGATIVE ng/mL
Nordiazepam: NEGATIVE ng/mL
Opiates: NEGATIVE ng/mL
Oxazepam: NEGATIVE ng/mL
Oxidant: NEGATIVE ug/mL
Oxycodone: NEGATIVE ng/mL
Temazepam: NEGATIVE ng/mL
pH: 5.3 (ref 4.5–9.0)

## 2020-01-19 ENCOUNTER — Other Ambulatory Visit: Payer: Self-pay | Admitting: Medical

## 2020-01-19 ENCOUNTER — Telehealth: Payer: Self-pay | Admitting: *Deleted

## 2020-01-19 NOTE — Telephone Encounter (Signed)
Copied from [place] [id number]. Topic: Quick Communication - Lab Results (Clinic Use ONLY) >> Jan 19, 2020  1:41 PM Scherrie Gerlach wrote: Pt would like call back about his lab results

## 2020-01-19 NOTE — Telephone Encounter (Addendum)
Requesting:Lorazepam  Contract:01/12/20 UDS:01/12/20 Last OV:01/12/20 Next OV:n/a  Last Refill:12/17/19#60 tab, 0 rf Database: reviewed today.   Please advise   Refilled rx today.

## 2020-01-19 NOTE — Telephone Encounter (Signed)
Spoke to patient and gave results and phone number to [facility] for patient to schedule

## 2020-01-19 NOTE — Telephone Encounter (Signed)
Left message on machine to call back to

## 2020-01-20 ENCOUNTER — Telehealth: Payer: Self-pay | Admitting: Medical

## 2020-01-20 MED ORDER — ATORVASTATIN CALCIUM 40 MG PO TABS: 40.0000 mg | ORAL_TABLET | Freq: Every day | ORAL | 0 refills | Status: DC

## 2020-01-20 NOTE — Telephone Encounter (Signed)
Patient is requesting Prescription Refill: atorvastatin (LIPITOR) 40 MG tablet  LORazepam (ATIVAN) 0.5 MG tablet   [facility] on [address] [phone]

## 2020-01-21 ENCOUNTER — Other Ambulatory Visit: Payer: Self-pay

## 2020-01-21 MED ORDER — ATORVASTATIN CALCIUM 40 MG PO TABS: 40.0000 mg | ORAL_TABLET | Freq: Every day | ORAL | 0 refills | Status: DC

## 2020-01-21 MED ORDER — LORAZEPAM 0.5 MG PO TABS: 0.5000 mg | ORAL_TABLET | Freq: Two times a day (BID) | ORAL | 2 refills | Status: DC

## 2020-01-21 NOTE — Telephone Encounter (Signed)
Filled again. On rx since controlled med stated if duplicate rx then not to fill.

## 2020-01-21 NOTE — Telephone Encounter (Signed)
Medication got failed by pharmacy retrying to send to pharmacy for patient

## 2020-01-22 ENCOUNTER — Other Ambulatory Visit: Payer: Self-pay | Admitting: Medical

## 2020-01-22 NOTE — Telephone Encounter (Addendum)
Requesting:Lorazepam Contract:10/28/18 UDS:01/12/20 Last OV:01/12/20 Next OV:n/a  Last Refill:01/21/20 Database:   Please advise    I believe this I filled his ativan twice now. I think you told me electronic failure so I resent. I can't send this rx again unless we know what is going on. Need to verify with his pharmacy whether the prescription went through or not?  If they said it did not then he might have to come in to get prescription.  I don't think faxing is option.

## 2020-01-26 ENCOUNTER — Other Ambulatory Visit: Payer: Self-pay | Admitting: Medical

## 2020-01-27 ENCOUNTER — Other Ambulatory Visit: Payer: Self-pay | Admitting: Medical

## 2020-02-24 ENCOUNTER — Telehealth: Payer: Self-pay

## 2020-02-24 NOTE — Telephone Encounter (Signed)
Catharine sent over a paper stating the patient's 0.5 mg Lorazepam was on back order and pt needs a new rx for 1 mg with direction change.

## 2020-02-25 NOTE — Telephone Encounter (Signed)
About ativan. Controlled medication web site states he filled the prescription on 02/19/20. He had refill. Confirm with pharmacy he is not about to run out of med. I will next rx around 03/18/2020. Don't want to send now.

## 2020-02-25 NOTE — Addendum Note (Signed)
Addended by: Anabel Halon on: 02/25/2020 08:51 AM   Modules accepted: Orders

## 2020-02-25 NOTE — Telephone Encounter (Signed)
Will fill 1 mg tab if 0.5 dose still on back order by 3/25.

## 2020-02-25 NOTE — Telephone Encounter (Signed)
Spoke with pharmacy and they stated he has one more refill which wont be needed until 3/25 .

## 2020-02-27 ENCOUNTER — Telehealth: Payer: Self-pay

## 2020-02-27 NOTE — Telephone Encounter (Signed)
Pt called about the ativan med is is on, called pt,left message for return call

## 2020-03-22 ENCOUNTER — Other Ambulatory Visit: Payer: Self-pay | Admitting: Medical

## 2020-04-19 ENCOUNTER — Other Ambulatory Visit: Payer: Self-pay | Admitting: Medical

## 2020-04-19 DIAGNOSIS — H43393 Other vitreous opacities, bilateral: Secondary | ICD-10-CM | POA: Diagnosis not present

## 2020-04-19 DIAGNOSIS — H2513 Age-related nuclear cataract, bilateral: Secondary | ICD-10-CM | POA: Diagnosis not present

## 2020-04-19 DIAGNOSIS — H5213 Myopia, bilateral: Secondary | ICD-10-CM | POA: Diagnosis not present

## 2020-04-19 DIAGNOSIS — H16223 Keratoconjunctivitis sicca, not specified as Sjogren's, bilateral: Secondary | ICD-10-CM | POA: Diagnosis not present

## 2020-04-19 DIAGNOSIS — H524 Presbyopia: Secondary | ICD-10-CM | POA: Diagnosis not present

## 2020-04-19 DIAGNOSIS — H52203 Unspecified astigmatism, bilateral: Secondary | ICD-10-CM | POA: Diagnosis not present

## 2020-04-19 NOTE — Telephone Encounter (Signed)
I refilled pt ativan. Will you double check and make sure he has update contract. His uds is up to date.

## 2020-04-26 ENCOUNTER — Other Ambulatory Visit: Payer: Self-pay | Admitting: Medical

## 2020-05-06 ENCOUNTER — Other Ambulatory Visit: Payer: Self-pay | Admitting: Medical

## 2020-05-12 ENCOUNTER — Ambulatory Visit: Payer: BC Managed Care – PPO | Admitting: Medical

## 2020-05-17 ENCOUNTER — Other Ambulatory Visit: Payer: Self-pay

## 2020-05-17 ENCOUNTER — Ambulatory Visit (INDEPENDENT_AMBULATORY_CARE_PROVIDER_SITE_OTHER): Payer: PPO | Admitting: Medical

## 2020-05-17 VITALS — BP 150/79 | HR 95 | Temp 97.0°F | Resp 20 | Ht 69.0 in | Wt 214.6 lb

## 2020-05-17 DIAGNOSIS — F419 Anxiety disorder, unspecified: Secondary | ICD-10-CM

## 2020-05-17 DIAGNOSIS — R972 Elevated prostate specific antigen [PSA]: Secondary | ICD-10-CM | POA: Diagnosis not present

## 2020-05-17 DIAGNOSIS — K635 Polyp of colon: Secondary | ICD-10-CM | POA: Diagnosis not present

## 2020-05-17 DIAGNOSIS — E785 Hyperlipidemia, unspecified: Secondary | ICD-10-CM

## 2020-05-17 DIAGNOSIS — J441 Chronic obstructive pulmonary disease with (acute) exacerbation: Secondary | ICD-10-CM | POA: Diagnosis not present

## 2020-05-17 DIAGNOSIS — Z23 Encounter for immunization: Secondary | ICD-10-CM

## 2020-05-17 DIAGNOSIS — I10 Essential (primary) hypertension: Secondary | ICD-10-CM

## 2020-05-17 MED ORDER — KETOCONAZOLE 2 % EX CREA: 1.0000 "application " | TOPICAL_CREAM | Freq: Every day | CUTANEOUS | 0 refills | Status: DC

## 2020-05-17 NOTE — Progress Notes (Signed)
Subjective:    Patient ID: Trevor Mack, male    DOB: 10/30/1956, 64 y.o.   MRN: [id number]  HPI  Pt bp level little high today. He did not take his med today.  Pt has copd history. Pt in past saw Dr. Lake Bells. Pt is on trelegy.Pt still smokes.   Pt has high cholesterol.  Pt is on lipitor.  Pt is on ativan for anxiety. He is due for controlled med contract update and uds.  Elevated psa velocity in the past. He states delay in urologist referral. He got some pill otc and urologist called pt at 3 months. He cancelled his appointment. He continue to not want referral. He states his eye MD told him not to take flomax.  Pt has gotten covid vaccine in March and April     Review of Systems  Constitutional: Negative for chills, fatigue and fever.  HENT: Negative for congestion, ear pain, postnasal drip, sinus pressure, sinus pain and sore throat.   Respiratory: Negative for cough, chest tightness, shortness of breath and wheezing.   Cardiovascular: Negative for chest pain and palpitations.  Gastrointestinal: Negative for abdominal pain.  Musculoskeletal: Negative for back pain, myalgias and neck stiffness.  Skin: Positive for rash. Negative for color change.       See hpi.  Neurological: Negative for dizziness, syncope, speech difficulty, weakness and headaches.  Psychiatric/Behavioral: Negative for agitation, confusion and suicidal ideas. The patient is not nervous/anxious.    Past Medical History:  Diagnosis Date  . ANXIETY 07/23/2008  . COLONIC POLYPS, HX OF 03/26/2006   MULTIPLE FRAGMENTS OF TUBULAR ADENOMAS POLYPS  . Diverticulosis   . Family history of colon cancer    Father   . GERD 07/23/2008  . Hepatitis C   . HYPERLIPIDEMIA 07/23/2008  . HYPERTENSION 07/23/2008  . TENDINITIS, LEFT THUMB 07/15/2010     Social History   Socioeconomic History  . Marital status: Widowed    Spouse name: Not on file  . Number of children: 0  . Years of education: Not on file  .  Highest education level: Not on file  Occupational History    Employer: [employer]  Tobacco Use  . Smoking status: Current Every Day Smoker    Packs/day: 1.50    Years: 35.00    Pack years: 52.50    Types: Cigarettes  . Smokeless tobacco: Never Used  . Tobacco comment: Started smoking at age 72.  Currently smoking 1 1/2 ppd.  Substance and Sexual Activity  . Alcohol use: No  . Drug use: No  . Sexual activity: Not on file  Other Topics Concern  . Not on file  Social History Narrative  . Not on file   Social Determinants of Health   Financial Resource Strain:   . Difficulty of Paying Living Expenses:   Food Insecurity:   . Worried About Charity fundraiser in the Last Year:   . Arboriculturist in the Last Year:   Transportation Needs:   . Film/video editor (Medical):   Marland Kitchen Lack of Transportation (Non-Medical):   Physical Activity:   . Days of Exercise per Week:   . Minutes of Exercise per Session:   Stress:   . Feeling of Stress :   Social Connections:   . Frequency of Communication with Friends and Family:   . Frequency of Social Gatherings with Friends and Family:   . Attends Religious Services:   . Active Member of Clubs or  Organizations:   . Attends Archivist Meetings:   Marland Kitchen Marital Status:   Intimate Partner Violence:   . Fear of Current or Ex-Partner:   . Emotionally Abused:   Marland Kitchen Physically Abused:   . Sexually Abused:     Past Surgical History:  Procedure Laterality Date  . LUMBAR LAMINECTOMY      Family History  Problem Relation Age of Onset  . Colon cancer Father   . Hypertension Father   . Diabetes Father   . Heart attack Father     No Known Allergies  Current Outpatient Medications on File Prior to Visit  Medication Sig Dispense Refill  . albuterol (PROVENTIL HFA;VENTOLIN HFA) 108 (90 Base) MCG/ACT inhaler     . albuterol (PROVENTIL) (2.5 MG/3ML) 0.083% nebulizer solution Take 3 mLs (2.5 mg total) by nebulization  every 6 (six) hours as needed for wheezing or shortness of breath. Dx code: J44.9 75 mL 11  . amLODipine (NORVASC) 5 MG tablet Take 1 tablet by mouth once daily 90 tablet 0  . atorvastatin (LIPITOR) 40 MG tablet Take 1 tablet (40 mg total) by mouth daily. 90 tablet 0  . azithromycin (ZITHROMAX) 250 MG tablet Take 250 mg by mouth daily. Take 2 tablets by mouth on day 1 followed by 1 tablet by mouth daily for 4 days    . benzonatate (TESSALON) 100 MG capsule Take 1 capsule (100 mg total) by mouth 3 (three) times daily as needed for cough. 30 capsule 0  . citalopram (CELEXA) 40 MG tablet Take 1 tablet by mouth once daily 30 tablet 0  . Fluticasone-Umeclidin-Vilant (TRELEGY ELLIPTA) 100-62.5-25 MCG/INH AEPB Inhale 1 puff into the lungs daily. 28 each 3  . ibuprofen (ADVIL) 200 MG tablet Take 400 mg by mouth every 6 (six) hours as needed.    Marland Kitchen LORazepam (ATIVAN) 0.5 MG tablet Take 1 tablet by mouth twice daily 60 tablet 0  . losartan (COZAAR) 100 MG tablet Take 1 tablet by mouth once daily 90 tablet 0  . omeprazole (PRILOSEC) 40 MG capsule Take 1 capsule (40 mg total) by mouth daily. 90 capsule 3  . predniSONE (DELTASONE) 10 MG tablet Take 10 mg by mouth daily with breakfast.    . tamsulosin (FLOMAX) 0.4 MG CAPS capsule Take 1 capsule (0.4 mg total) by mouth daily. 90 capsule 3   No current facility-administered medications on file prior to visit.    BP (!) 150/79 (BP Location: Left Arm, Patient Position: Sitting, Cuff Size: Large)   Pulse 95   Temp (!) 97 F (36.1 C) (Temporal)   Resp 20   Ht 5\' 9"  (1.753 m)   Wt 214 lb 9.6 oz (97.3 kg)   SpO2 93%   BMI 31.69 kg/m      Objective:   Physical Exam  General- No acute distress. Pleasant patient. Neck- Full range of motion, no jvd Lungs- Clear, even and unlabored. Heart- regular rate and rhythm. Neurologic- CNII- XII grossly intact.       Assessment & Plan:  Your bp is high today but you did not take your medication. Please take  daily.  For increased prostate velocity you declined referral to urologist as you report signs/symptoms improved with otc med. Will put in future psa to follow and if increased again offer referral.  For anxiety continue with ativan. Anxiety is controlled.  For high cholesterol will place future lipid panel and cmp since today not fasting.  For copd continue with inhalers that your specialist  has rx'd.  For angular cheilitis, rx ketoconazole.  For hx of colon polyps you are following prior pcp recommendation who told you not to do. But reminder gi wanted you to repeat in 2018. Can refer if you reconsider.  Follow up 3 months or as needed  Mackie Pai, PA-C   Time spent with patient today was  25 minutes which consisted of chart review, discussing diagnosis, work up, treatment and documentation.

## 2020-05-17 NOTE — Patient Instructions (Signed)
Your bp is high today but you did not take your medication. Please take daily.  For increased prostate velocity you declined referral to urologist as you report signs/symptoms improved with otc med. Will put in future psa to follow and if increased again offer referral.  For anxiety continue with ativan. Anxiety is controlled.  For high cholesterol will place future lipid panel and cmp since today not fasting.  For copd continue with inhalers that your specialist has rx'd.  For angular cheilitis, rx ketoconazole.  For hx of colon polyps you are following prior pcp recommendation who told you not to do. But reminder gi wanted you to repeat in 2018. Can refer if you reconsider.  Follow up 3 months or as needed

## 2020-05-18 ENCOUNTER — Other Ambulatory Visit: Payer: Self-pay | Admitting: Medical

## 2020-05-18 NOTE — Telephone Encounter (Addendum)
Requesting: ativan  Contract:11/19/18 UDS:01/12/20 Last Visit:05/17/20 Next Visit:n/a Last Refill:04/19/20  Please Advise  Refilled his ativan. Please get him scheduled to update his contract in upcoming week.

## 2020-05-25 ENCOUNTER — Other Ambulatory Visit: Payer: Self-pay | Admitting: Medical

## 2020-05-26 ENCOUNTER — Other Ambulatory Visit (INDEPENDENT_AMBULATORY_CARE_PROVIDER_SITE_OTHER): Payer: PPO

## 2020-05-26 ENCOUNTER — Other Ambulatory Visit: Payer: Self-pay

## 2020-05-26 DIAGNOSIS — E785 Hyperlipidemia, unspecified: Secondary | ICD-10-CM

## 2020-05-26 DIAGNOSIS — R972 Elevated prostate specific antigen [PSA]: Secondary | ICD-10-CM

## 2020-05-26 DIAGNOSIS — I10 Essential (primary) hypertension: Secondary | ICD-10-CM

## 2020-05-26 LAB — COMPREHENSIVE METABOLIC PANEL WITH GFR
ALT: 24 U/L (ref 0–53)
AST: 17 U/L (ref 0–37)
Albumin: 4.3 g/dL (ref 3.5–5.2)
Alkaline Phosphatase: 115 U/L (ref 39–117)
BUN: 19 mg/dL (ref 6–23)
CO2: 29 meq/L (ref 19–32)
Calcium: 8.8 mg/dL (ref 8.4–10.5)
Chloride: 101 meq/L (ref 96–112)
Creatinine, Ser: 1.02 mg/dL (ref 0.40–1.50)
GFR: 73.74 mL/min
Glucose, Bld: 93 mg/dL (ref 70–99)
Potassium: 4.1 meq/L (ref 3.5–5.1)
Sodium: 137 meq/L (ref 135–145)
Total Bilirubin: 0.7 mg/dL (ref 0.2–1.2)
Total Protein: 6.9 g/dL (ref 6.0–8.3)

## 2020-05-26 LAB — LIPID PANEL
Cholesterol: 124 mg/dL (ref 0–200)
HDL: 25.5 mg/dL — ABNORMAL LOW (ref >39.00)
LDL Cholesterol: 71 mg/dL (ref 0–99)
NonHDL: 98.08
Total CHOL/HDL Ratio: 5
Triglycerides: 135 mg/dL (ref 0.0–149.0)
VLDL: 27 mg/dL (ref 0.0–40.0)

## 2020-05-26 LAB — PSA: PSA: 3.37 ng/mL (ref 0.10–4.00)

## 2020-06-07 ENCOUNTER — Other Ambulatory Visit: Payer: Self-pay | Admitting: Medical

## 2020-06-18 ENCOUNTER — Other Ambulatory Visit: Payer: Self-pay | Admitting: Medical

## 2020-06-18 NOTE — Telephone Encounter (Signed)
Requesting: ativan Contract:11/19/18 UDS:01/12/20 Last Visit:05/17/20 Next Visit: n/a Last Refill:05/19/20  Please Advise

## 2020-06-19 ENCOUNTER — Other Ambulatory Visit: Payer: Self-pay | Admitting: Medical

## 2020-06-19 NOTE — Telephone Encounter (Signed)
I sent in only 8 tabs ativan. He needs to update his controlled med contract early this  week before I can give him full month worth. Needs update contract. He has been on benzo for years so can't abruptly dicontinue benzo due to possible seizure. So please get signed contact by Wednesday and update me that it is done.

## 2020-06-21 NOTE — Telephone Encounter (Signed)
Patient notified .stated he will come in on Tuesday to sign contract

## 2020-06-22 ENCOUNTER — Other Ambulatory Visit: Payer: Self-pay | Admitting: Medical

## 2020-06-22 MED ORDER — LORAZEPAM 0.5 MG PO TABS: 0.5000 mg | ORAL_TABLET | Freq: Two times a day (BID) | ORAL | 2 refills | Status: DC

## 2020-06-22 NOTE — Addendum Note (Signed)
Addended by: Anabel Halon on: 06/22/2020 10:04 PM   Modules accepted: Orders

## 2020-06-22 NOTE — Telephone Encounter (Signed)
Pt is hx of anxiety for years. Former pt of Dr. Kennieth Rad.  Pt has been on ativan 0.5 mg twice daily. Anxiety controlled.   Up to date on contract and uds.  Will follow up with him in August.   Sending for controlled med review.

## 2020-06-22 NOTE — Telephone Encounter (Signed)
Rx ativan sent to pt pharmacy.  

## 2020-06-22 NOTE — Telephone Encounter (Signed)
Contract updated 06/22/20 and sent to scan

## 2020-06-23 NOTE — Telephone Encounter (Signed)
Reviewed  Ramon Brant R Lowne Chase, DO  

## 2020-06-28 ENCOUNTER — Other Ambulatory Visit: Payer: Self-pay | Admitting: Medical

## 2020-07-08 ENCOUNTER — Other Ambulatory Visit: Payer: Self-pay | Admitting: Medical

## 2020-07-20 ENCOUNTER — Other Ambulatory Visit: Payer: Self-pay | Admitting: Medical

## 2020-07-26 ENCOUNTER — Telehealth: Payer: Self-pay | Admitting: Medical

## 2020-07-26 ENCOUNTER — Ambulatory Visit: Payer: PPO | Admitting: Pulmonary Disease

## 2020-07-26 NOTE — Telephone Encounter (Signed)
New message:   Pt is calling and would like to be referred to a lung specialists. He states he has COPD. Pt states he was told by [facility] to go back and see the one he saw before but that office is now closed. Please advise.

## 2020-07-27 ENCOUNTER — Telehealth: Payer: Self-pay | Admitting: Medical

## 2020-07-27 ENCOUNTER — Other Ambulatory Visit: Payer: Self-pay | Admitting: Medical

## 2020-07-27 DIAGNOSIS — J441 Chronic obstructive pulmonary disease with (acute) exacerbation: Secondary | ICD-10-CM

## 2020-07-27 NOTE — Telephone Encounter (Signed)
New referral placed.

## 2020-07-27 NOTE — Telephone Encounter (Signed)
Caller: Jauan Call back # [phone]  Patient is inquiring about the lung specialists.

## 2020-07-27 NOTE — Telephone Encounter (Signed)
Referral to new pulmonologist placed.

## 2020-08-04 ENCOUNTER — Ambulatory Visit (INDEPENDENT_AMBULATORY_CARE_PROVIDER_SITE_OTHER): Payer: PPO | Admitting: Pulmonary Disease

## 2020-08-04 ENCOUNTER — Encounter: Payer: Self-pay | Admitting: Pulmonary Disease

## 2020-08-04 ENCOUNTER — Other Ambulatory Visit: Payer: Self-pay

## 2020-08-04 VITALS — BP 126/76 | HR 109 | Temp 97.4°F | Ht 69.0 in | Wt 218.0 lb

## 2020-08-04 DIAGNOSIS — J441 Chronic obstructive pulmonary disease with (acute) exacerbation: Secondary | ICD-10-CM | POA: Diagnosis not present

## 2020-08-04 DIAGNOSIS — R0683 Snoring: Secondary | ICD-10-CM

## 2020-08-04 DIAGNOSIS — F17210 Nicotine dependence, cigarettes, uncomplicated: Secondary | ICD-10-CM

## 2020-08-04 MED ORDER — PREDNISONE 10 MG PO TABS: 40.0000 mg | ORAL_TABLET | Freq: Every day | ORAL | 0 refills | Status: AC

## 2020-08-04 MED ORDER — AZITHROMYCIN 250 MG PO TABS: ORAL_TABLET | ORAL | 0 refills | Status: DC

## 2020-08-04 NOTE — Progress Notes (Signed)
Patient ID: ROSA GAMBALE, male    DOB: Aug 19, 1956, 64 y.o.   MRN: [id number]  Chief Complaint  Patient presents with  . Consult    former [facility] patient re-establishing care.     Referring provider: Mackie Pai, PA-C  HPI: Ha Placeres is a 64 year old male, daily smoker with history of COPD who returns to pulmonary clinic with worsening shortness of breath.   He continues to smoke 2 packs per day. He is taking trelegy ellipta daily and has been using his albuterol inhaler 2-3 times per day over the last couple of weeks due to shortness of breath. He reports increased wheezing and chest tightness. He has sputum production in the mornings which is clear and has increased some since he has developed worsening shortness of breath. He denies any prednisone use or hospitalizations of the past year.    TEST/EVENTS :  Sleep study: 12/2017: AHI normal, O2 mean 93%, lowest 91% he left the test early and says that he only got about 2 hours worth of sleep.  PFT: April 2018: Ratio 61%, FEV1 1.75 L 17% change, 50% predicted, FVC 2.85 L 61% predicted, total lung capacity 7.02 L 103% predicted, residual volume 214% predicted, DLCO 12.29 39% predicted  No Known Allergies  Immunization History  Administered Date(s) Administered  . Hepatitis A, Adult 05/23/2018  . Hepatitis B, adult 05/23/2018  . Influenza Whole 10/23/2012  . Influenza,inj,Quad PF,6+ Mos 10/05/2017, 09/12/2018, 10/08/2019  . Influenza-Unspecified 09/05/2016  . Moderna SARS-COVID-2 Vaccination 03/09/2020, 04/06/2020  . Pneumococcal Conjugate-13 10/30/2017  . Pneumococcal Polysaccharide-23 03/05/2018  . Tdap 11/13/2018, 05/17/2020    Past Medical History:  Diagnosis Date  . ANXIETY 07/23/2008  . COLONIC POLYPS, HX OF 03/26/2006   MULTIPLE FRAGMENTS OF TUBULAR ADENOMAS POLYPS  . Diverticulosis   . Family history of colon cancer    Father   . GERD 07/23/2008  . Hepatitis C   . HYPERLIPIDEMIA 07/23/2008  .  HYPERTENSION 07/23/2008  . TENDINITIS, LEFT THUMB 07/15/2010    Tobacco History: Social History   Tobacco Use  Smoking Status Current Every Day Smoker  . Packs/day: 2.00  . Years: 35.00  . Pack years: 70.00  . Types: Cigarettes  Smokeless Tobacco Never Used  Tobacco Comment   Started smoking at age 5.  Currently smoking 1 1/2 ppd.   Ready to quit: Not Answered Counseling given: Not Answered Comment: Started smoking at age 38.  Currently smoking 1 1/2 ppd.   Outpatient Medications Prior to Visit  Medication Sig Dispense Refill  . albuterol (PROVENTIL HFA;VENTOLIN HFA) 108 (90 Base) MCG/ACT inhaler Inhale 1-2 puffs into the lungs every 6 (six) hours as needed.     Marland Kitchen albuterol (PROVENTIL) (2.5 MG/3ML) 0.083% nebulizer solution Take 3 mLs (2.5 mg total) by nebulization every 6 (six) hours as needed for wheezing or shortness of breath. Dx code: J44.9 75 mL 11  . amLODipine (NORVASC) 5 MG tablet Take 1 tablet (5 mg total) by mouth daily. 90 tablet 0  . atorvastatin (LIPITOR) 40 MG tablet Take 1 tablet by mouth once daily 90 tablet 0  . citalopram (CELEXA) 40 MG tablet Take 1 tablet by mouth once daily 30 tablet 0  . Fluticasone-Umeclidin-Vilant (TRELEGY ELLIPTA) 100-62.5-25 MCG/INH AEPB Inhale 1 puff into the lungs daily. 60 each 5  . ibuprofen (ADVIL) 200 MG tablet Take 400 mg by mouth every 6 (six) hours as needed.    Marland Kitchen ketoconazole (NIZORAL) 2 % cream Apply 1 application topically daily.  15 g 0  . LORazepam (ATIVAN) 0.5 MG tablet Take 1 tablet (0.5 mg total) by mouth 2 (two) times daily. 60 tablet 2  . losartan (COZAAR) 100 MG tablet Take 1 tablet (100 mg total) by mouth daily. 90 tablet 1  . omeprazole (PRILOSEC) 40 MG capsule Take 1 capsule (40 mg total) by mouth daily. 90 capsule 3  . tamsulosin (FLOMAX) 0.4 MG CAPS capsule Take 1 capsule (0.4 mg total) by mouth daily. 90 capsule 3  . azithromycin (ZITHROMAX) 250 MG tablet Take 250 mg by mouth daily. Take 2 tablets by mouth on day  1 followed by 1 tablet by mouth daily for 4 days (Patient not taking: Reported on 08/04/2020)    . benzonatate (TESSALON) 100 MG capsule Take 1 capsule (100 mg total) by mouth 3 (three) times daily as needed for cough. (Patient not taking: Reported on 08/04/2020) 30 capsule 0  . predniSONE (DELTASONE) 10 MG tablet Take 10 mg by mouth daily with breakfast. (Patient not taking: Reported on 08/04/2020)     No facility-administered medications prior to visit.     Review of Systems:   Constitutional:   No  weight loss, night sweats,  Fevers, chills, fatigue, or  lassitude.  HEENT:   No headaches,  Difficulty swallowing,  Tooth/dental problems, or  Sore throat,                No sneezing, itching, ear ache, nasal congestion, post nasal drip,   CV:  No chest pain,  Orthopnea, PND, swelling in lower extremities, anasarca, dizziness, palpitations, syncope.   GI  No heartburn, indigestion, abdominal pain, nausea, vomiting, diarrhea, change in bowel habits, loss of appetite, bloody stools.   Resp: + shortness of breath with exertion and at rest.  Slight increase in mucous production.  No coughing up of blood.  No change in color of mucus.  + wheezing.  No chest wall deformity  Skin: no rash or lesions.  GU: no dysuria, change in color of urine, no urgency or frequency.  No flank pain, no hematuria   MS:  No joint pain or swelling.  No decreased range of motion.  No back pain.    Physical Exam  BP 126/76 (BP Location: Left Arm, Cuff Size: Normal)   Pulse (!) 109   Temp (!) 97.4 F (36.3 C) (Temporal)   Ht 5\' 9"  (1.753 m)   Wt 218 lb (98.9 kg)   SpO2 96%   BMI 32.19 kg/m   GEN: A/Ox3; pleasant , NAD, well nourished    HEENT:  /AT,  EACs-clear, TMs-wnl, NOSE-clear, THROAT-clear, no lesions, no postnasal drip or exudate noted.   NECK:  Supple w/ fair ROM; no JVD; normal carotid impulses w/o bruits; no thyromegaly or nodules palpated; no lymphadenopathy.    RESP: Scattered wheezes and  rhonchi bilaterally. no dullness to percussion  CARD:  RRR, no m/r/g, no peripheral edema, pulses intact, no cyanosis or clubbing.  GI:   Soft & nt; nml bowel sounds; no organomegaly or masses detected.   Musco: Warm bil, no deformities or joint swelling noted.   Neuro: alert, no focal deficits noted.    Skin: Warm, no lesions or rashes    Lab Results:  CBC    Component Value Date/Time   WBC 10.1 04/17/2018 0839   RBC 5.70 04/17/2018 0839   HGB 16.2 04/17/2018 0839   HCT 47.3 04/17/2018 0839   PLT 169.0 04/17/2018 0839   MCV 83.0 04/17/2018 0839   MCHC  34.3 04/17/2018 0839   RDW 14.7 04/17/2018 0839   LYMPHSABS 2.0 04/17/2018 0839   MONOABS 0.6 04/17/2018 0839   EOSABS 0.1 04/17/2018 0839   BASOSABS 0.1 04/17/2018 0839    BMET    Component Value Date/Time   NA 137 05/26/2020 0719   K 4.1 05/26/2020 0719   CL 101 05/26/2020 0719   CO2 29 05/26/2020 0719   GLUCOSE 93 05/26/2020 0719   BUN 19 05/26/2020 0719   CREATININE 1.02 05/26/2020 0719   CALCIUM 8.8 05/26/2020 0719   GFRNONAA 83.10 03/28/2010 0854   GFRAA 114 07/15/2008 0816     PFT Results Latest Ref Rng & Units 04/12/2017  FVC-Pre L 2.43  FVC-Predicted Pre % 52  FVC-Post L 2.85  FVC-Predicted Post % 61  Pre FEV1/FVC % % 62  Post FEV1/FCV % % 61  FEV1-Pre L 1.49  FEV1-Predicted Pre % 42  FEV1-Post L 1.75  DLCO uncorrected ml/min/mmHg 12.29  DLCO UNC% % 39  DLCO corrected ml/min/mmHg 11.82  DLCO COR %Predicted % 38  DLVA Predicted % 61  TLC L 7.02  TLC % Predicted % 103  RV % Predicted % 214    Assessment & Plan:   Ines Warf is a 64 year old male, daily smoker with history of COPD who returns to pulmonary clinic with worsening shortness of breath.   His shortness of breath is secondary to an acute exacerbation of COPD. Based on his history he is considered GOLD Stage B. He will be provided with prednisone and azithromycin to be taken for 5 days.  He is to continue on trelegy ellipta and  as needed albuterol.   Given his history of COPD and concern for sleep apnea, we will perform nocturnal oximetry to determine if he would benefit from nocturnal oxygen therapy.   Based on his smoking history, he does qualify for lung cancer screening. At the next follow up visit we will check a low dose CT chest scan and determine further surveillance chest imaging based on results.  He is not interested in smoking cessation aids at this time.  Patient to follow up in 3 months.  Freddi Starr, MD 08/04/2020

## 2020-08-04 NOTE — Patient Instructions (Signed)
Take prednisone 40mg  daily for 5 days Take azithromycin for 5 days, 2 tablets on day one then 1 tablet daily until gone We will setup nocturnal oxygen test in 2 weeks to check need for oxygen at night Follow up in 6 months

## 2020-08-06 ENCOUNTER — Ambulatory Visit: Payer: PPO | Admitting: Primary Care

## 2020-08-06 ENCOUNTER — Encounter: Payer: Self-pay | Admitting: Genetic Counselor

## 2020-08-09 ENCOUNTER — Telehealth: Payer: Self-pay | Admitting: Pulmonary Disease

## 2020-08-09 NOTE — Telephone Encounter (Signed)
Called pt & gave him [employer]'s phone number.

## 2020-08-10 ENCOUNTER — Other Ambulatory Visit: Payer: Self-pay | Admitting: Medical

## 2020-08-12 ENCOUNTER — Telehealth: Payer: Self-pay | Admitting: Pulmonary Disease

## 2020-08-12 MED ORDER — ALBUTEROL SULFATE HFA 108 (90 BASE) MCG/ACT IN AERS: 1.0000 | INHALATION_SPRAY | Freq: Four times a day (QID) | RESPIRATORY_TRACT | 5 refills | Status: DC | PRN

## 2020-08-12 NOTE — Telephone Encounter (Signed)
Called and spoke with Patient.  Patient stated he was needing a refill for his Albuterol inhaler sent to [facility]. Albuterol refill sent to requested pharmacy. Nothing further at this time.

## 2020-08-12 NOTE — Telephone Encounter (Signed)
[facility] patient unable to reach No mention of inhaler on AVS  And looks like trelegy was ordered by another provider. Only one would be the albuterol

## 2020-08-12 NOTE — Telephone Encounter (Signed)
Pt returning a phone call. Pt can be reached at [phone].

## 2020-09-06 ENCOUNTER — Ambulatory Visit (INDEPENDENT_AMBULATORY_CARE_PROVIDER_SITE_OTHER): Payer: PPO | Admitting: Medical

## 2020-09-06 ENCOUNTER — Other Ambulatory Visit: Payer: Self-pay

## 2020-09-06 ENCOUNTER — Ambulatory Visit (HOSPITAL_BASED_OUTPATIENT_CLINIC_OR_DEPARTMENT_OTHER)
Admission: RE | Admit: 2020-09-06 | Discharge: 2020-09-06 | Disposition: A | Discharge status: Home / Self Care | Payer: PPO | Source: Ambulatory Visit | Attending: Medical | Admitting: Medical

## 2020-09-06 VITALS — BP 128/73 | HR 110 | Resp 18 | Ht 69.0 in | Wt 216.8 lb

## 2020-09-06 DIAGNOSIS — R0781 Pleurodynia: Secondary | ICD-10-CM | POA: Diagnosis not present

## 2020-09-06 DIAGNOSIS — Z23 Encounter for immunization: Secondary | ICD-10-CM | POA: Diagnosis not present

## 2020-09-06 NOTE — Addendum Note (Signed)
Addended by: Jeronimo Greaves on: 09/06/2020 10:35 AM   Modules accepted: Orders

## 2020-09-06 NOTE — Patient Instructions (Addendum)
For left rib pain will get rib xray to evaluate if you had fracture. If not fracture then continue tylenol only. If fracture seen then may provide limited tramadol prescription to use just at night.  Pt will flu vaccine today.  Follow up early November routine check for chronic problems but sooner if needed.

## 2020-09-06 NOTE — Progress Notes (Signed)
Subjective:    Patient ID: Trevor Mack, male    DOB: 1956/06/01, 64 y.o.   MRN: [id number]  HPI  Pt was bent over last week tying his shoes and he felt slight dizzy. He states he was adjusting the heel and fell hitting arm of couch. No loc. No preceding chest pain. No gross motor or sensory function deficits at the time. No head trauma.  He hit his left rib on the arm of couch. Pain has persisted. Pain noticed at night when rolling over. Otherwise state tylenol will control pain.   Review of Systems  Constitutional: Negative for chills, diaphoresis and fatigue.  Respiratory: Negative for chest tightness, shortness of breath and wheezing.   Cardiovascular: Negative for chest pain and palpitations.  Gastrointestinal: Negative for abdominal pain.  Musculoskeletal: Negative for back pain.       Left rib pain.  Neurological: Negative for dizziness and headaches.  Hematological: Negative for adenopathy. Does not bruise/bleed easily.    Past Medical History:  Diagnosis Date  . ANXIETY 07/23/2008  . COLONIC POLYPS, HX OF 03/26/2006   MULTIPLE FRAGMENTS OF TUBULAR ADENOMAS POLYPS  . Diverticulosis   . Family history of colon cancer    Father   . GERD 07/23/2008  . Hepatitis C   . HYPERLIPIDEMIA 07/23/2008  . HYPERTENSION 07/23/2008  . TENDINITIS, LEFT THUMB 07/15/2010     Social History   Socioeconomic History  . Marital status: Widowed    Spouse name: Not on file  . Number of children: 0  . Years of education: Not on file  . Highest education level: Not on file  Occupational History    Employer: [employer]  Tobacco Use  . Smoking status: Current Every Day Smoker    Packs/day: 2.00    Years: 35.00    Pack years: 70.00    Types: Cigarettes  . Smokeless tobacco: Never Used  . Tobacco comment: Started smoking at age 34.  Currently smoking 1 1/2 ppd.  Vaping Use  . Vaping Use: Never used  Substance and Sexual Activity  . Alcohol use: No  . Drug use: No  .  Sexual activity: Not on file  Other Topics Concern  . Not on file  Social History Narrative  . Not on file   Social Determinants of Health   Financial Resource Strain:   . Difficulty of Paying Living Expenses: Not on file  Food Insecurity:   . Worried About Charity fundraiser in the Last Year: Not on file  . Ran Out of Food in the Last Year: Not on file  Transportation Needs:   . Lack of Transportation (Medical): Not on file  . Lack of Transportation (Non-Medical): Not on file  Physical Activity:   . Days of Exercise per Week: Not on file  . Minutes of Exercise per Session: Not on file  Stress:   . Feeling of Stress : Not on file  Social Connections:   . Frequency of Communication with Friends and Family: Not on file  . Frequency of Social Gatherings with Friends and Family: Not on file  . Attends Religious Services: Not on file  . Active Member of Clubs or Organizations: Not on file  . Attends Archivist Meetings: Not on file  . Marital Status: Not on file  Intimate Partner Violence:   . Fear of Current or Ex-Partner: Not on file  . Emotionally Abused: Not on file  . Physically Abused: Not on file  .  Sexually Abused: Not on file    Past Surgical History:  Procedure Laterality Date  . LUMBAR LAMINECTOMY      Family History  Problem Relation Age of Onset  . Colon cancer Father   . Hypertension Father   . Diabetes Father   . Heart attack Father     No Known Allergies  Current Outpatient Medications on File Prior to Visit  Medication Sig Dispense Refill  . albuterol (PROVENTIL) (2.5 MG/3ML) 0.083% nebulizer solution Take 3 mLs (2.5 mg total) by nebulization every 6 (six) hours as needed for wheezing or shortness of breath. Dx code: J44.9 75 mL 11  . albuterol (VENTOLIN HFA) 108 (90 Base) MCG/ACT inhaler Inhale 1-2 puffs into the lungs every 6 (six) hours as needed. 8 g 5  . amLODipine (NORVASC) 5 MG tablet Take 1 tablet (5 mg total) by mouth daily. 90  tablet 0  . atorvastatin (LIPITOR) 40 MG tablet Take 1 tablet by mouth once daily 90 tablet 0  . azithromycin (ZITHROMAX) 250 MG tablet Take 2 tablets on day 1, then 1 tablet daily until gone (Patient not taking: Reported on 09/06/2020) 6 tablet 0  . citalopram (CELEXA) 40 MG tablet Take 1 tablet by mouth once daily 30 tablet 0  . Fluticasone-Umeclidin-Vilant (TRELEGY ELLIPTA) 100-62.5-25 MCG/INH AEPB Inhale 1 puff into the lungs daily. 60 each 5  . ibuprofen (ADVIL) 200 MG tablet Take 400 mg by mouth every 6 (six) hours as needed.    Marland Kitchen ketoconazole (NIZORAL) 2 % cream Apply 1 application topically daily. 15 g 0  . LORazepam (ATIVAN) 0.5 MG tablet Take 1 tablet (0.5 mg total) by mouth 2 (two) times daily. 60 tablet 2  . losartan (COZAAR) 100 MG tablet Take 1 tablet (100 mg total) by mouth daily. 90 tablet 1  . omeprazole (PRILOSEC) 40 MG capsule Take 1 capsule (40 mg total) by mouth daily. 90 capsule 3  . tamsulosin (FLOMAX) 0.4 MG CAPS capsule Take 1 capsule (0.4 mg total) by mouth daily. 90 capsule 3   No current facility-administered medications on file prior to visit.    BP 128/73   Pulse (!) 110   Resp 18   Ht 5\' 9"  (1.753 m)   Wt 216 lb 12.8 oz (98.3 kg)   SpO2 94%   BMI 32.02 kg/m       Objective:   Physical Exam  General- No acute distress. Pleasant patient. Neck- Full range of motion, no jvd Lungs- Clear, even and unlabored. Heart- regular rate and rhythm. Neurologic- CNII- XII grossly intact. Anterior chest- left side anterior lower rib tenderness to palpation.        Assessment & Plan:  For left rib pain will get rib xray to evaluate if you had fracture. If not fracture then continue tylenol only. If fracture seen then may provide limited tramadol prescription to use just at night.  Pt will flu vaccine today.  Follow up early November routine check for chronic problems but sooner if needed.  Mackie Pai, PA-C

## 2020-09-08 ENCOUNTER — Telehealth: Payer: Self-pay | Admitting: Medical

## 2020-09-08 NOTE — Telephone Encounter (Signed)
Patient requesting a call back in reference to imaging results.

## 2020-09-13 ENCOUNTER — Other Ambulatory Visit: Payer: Self-pay | Admitting: Medical

## 2020-09-17 ENCOUNTER — Other Ambulatory Visit: Payer: Self-pay | Admitting: Medical

## 2020-09-17 NOTE — Telephone Encounter (Signed)
Requesting: ativan Contract:07/27/20 UDS:01/12/20 Last Visit:09/06/20 Next Visit:11/08/20 Last Refill:06/22/20  Please Advise

## 2020-09-20 ENCOUNTER — Telehealth: Payer: Self-pay | Admitting: Medical

## 2020-09-20 MED ORDER — LORAZEPAM 0.5 MG PO TABS: 0.5000 mg | ORAL_TABLET | Freq: Two times a day (BID) | ORAL | 0 refills | Status: DC

## 2020-09-20 NOTE — Telephone Encounter (Signed)
Medication:LORazepam (ATIVAN) 0.5 MG tablet [[id number]]       Has the patient contacted their pharmacy?  (If no, request that the patient contact the pharmacy for the refill.) (If yes, when and what did the pharmacy advise?)     Preferred Pharmacy (with phone number or street name): [place], [place] [address]  Phone:  [phone] Fax:  [phone]      Agent: Please be advised that RX refills may take up to 3 business days. We ask that you follow-up with your pharmacy.

## 2020-09-20 NOTE — Addendum Note (Signed)
Addended by: Anabel Halon on: 09/20/2020 04:09 PM   Modules accepted: Orders

## 2020-09-20 NOTE — Telephone Encounter (Addendum)
Requesting: ativan Contract:07/27/20 UDS:01/12/20 Last Visit:09/06/20 Next Visit:11/08/20 Last Refill:06/22/20  Please Advise  Rx refill sent to pt pharmacy.

## 2020-09-21 ENCOUNTER — Telehealth: Payer: Self-pay | Admitting: Medical

## 2020-09-21 MED ORDER — AMLODIPINE BESYLATE 5 MG PO TABS: 5.0000 mg | ORAL_TABLET | Freq: Every day | ORAL | 0 refills | Status: DC

## 2020-09-21 NOTE — Telephone Encounter (Signed)
Rx refill of amlodipine.Ativan filled yesterday.

## 2020-09-21 NOTE — Telephone Encounter (Signed)
Opened to review 

## 2020-09-22 ENCOUNTER — Other Ambulatory Visit: Payer: Self-pay | Admitting: Medical

## 2020-10-12 ENCOUNTER — Other Ambulatory Visit: Payer: Self-pay | Admitting: Medical

## 2020-10-19 ENCOUNTER — Other Ambulatory Visit: Payer: Self-pay | Admitting: Medical

## 2020-10-19 NOTE — Telephone Encounter (Signed)
Requesting:LORazepam (ATIVAN) 0.5 MG tablet Contract: UDS:01/12/20 Last Visit: 09/06/20 Next Visit:10/29/20 Last Refill:09/20/2020  Please Advise

## 2020-10-19 NOTE — Telephone Encounter (Signed)
Rx refill ativan sent to pt pharmacy.

## 2020-10-21 ENCOUNTER — Other Ambulatory Visit: Payer: Self-pay | Admitting: Medical

## 2020-11-02 ENCOUNTER — Ambulatory Visit: Payer: PPO | Admitting: Pulmonary Disease

## 2020-11-08 ENCOUNTER — Ambulatory Visit (INDEPENDENT_AMBULATORY_CARE_PROVIDER_SITE_OTHER): Payer: PPO | Admitting: Medical

## 2020-11-08 ENCOUNTER — Other Ambulatory Visit: Payer: Self-pay

## 2020-11-08 ENCOUNTER — Encounter: Payer: Self-pay | Admitting: Medical

## 2020-11-08 VITALS — BP 100/60 | HR 104 | Resp 18 | Ht 69.0 in | Wt 217.4 lb

## 2020-11-08 DIAGNOSIS — F419 Anxiety disorder, unspecified: Secondary | ICD-10-CM | POA: Diagnosis not present

## 2020-11-08 DIAGNOSIS — I9589 Other hypotension: Secondary | ICD-10-CM | POA: Diagnosis not present

## 2020-11-08 DIAGNOSIS — J441 Chronic obstructive pulmonary disease with (acute) exacerbation: Secondary | ICD-10-CM

## 2020-11-08 DIAGNOSIS — Z79899 Other long term (current) drug therapy: Secondary | ICD-10-CM

## 2020-11-08 LAB — COMPREHENSIVE METABOLIC PANEL
ALT: 23 U/L (ref 0–53)
AST: 17 U/L (ref 0–37)
Albumin: 4.4 g/dL (ref 3.5–5.2)
Alkaline Phosphatase: 128 U/L — ABNORMAL HIGH (ref 39–117)
BUN: 13 mg/dL (ref 6–23)
CO2: 29 mEq/L (ref 19–32)
Calcium: 8.8 mg/dL (ref 8.4–10.5)
Chloride: 100 mEq/L (ref 96–112)
Creatinine, Ser: 1.06 mg/dL (ref 0.40–1.50)
GFR: 74.67 mL/min (ref >60.00)
Glucose, Bld: 76 mg/dL (ref 70–99)
Potassium: 3.9 mEq/L (ref 3.5–5.1)
Sodium: 138 mEq/L (ref 135–145)
Total Bilirubin: 0.9 mg/dL (ref 0.2–1.2)
Total Protein: 7.2 g/dL (ref 6.0–8.3)

## 2020-11-08 LAB — CBC WITH DIFFERENTIAL/PLATELET
Basophils Absolute: 0.1 10*3/uL (ref 0.0–0.1)
Basophils Relative: 0.7 % (ref 0.0–3.0)
Eosinophils Absolute: 0.1 10*3/uL (ref 0.0–0.7)
Eosinophils Relative: 0.8 % (ref 0.0–5.0)
HCT: 46.5 % (ref 39.0–52.0)
Hemoglobin: 15.5 g/dL (ref 13.0–17.0)
Lymphocytes Relative: 19.2 % (ref 12.0–46.0)
Lymphs Abs: 2.2 10*3/uL (ref 0.7–4.0)
MCHC: 33.3 g/dL (ref 30.0–36.0)
MCV: 84.1 fl (ref 78.0–100.0)
Monocytes Absolute: 0.6 10*3/uL (ref 0.1–1.0)
Monocytes Relative: 5.2 % (ref 3.0–12.0)
Neutro Abs: 8.4 10*3/uL — ABNORMAL HIGH (ref 1.4–7.7)
Neutrophils Relative %: 74.1 % (ref 43.0–77.0)
Platelets: 188 10*3/uL (ref 150.0–400.0)
RBC: 5.52 Mil/uL (ref 4.22–5.81)
RDW: 15.3 % (ref 11.5–15.5)
WBC: 11.3 10*3/uL — ABNORMAL HIGH (ref 4.0–10.5)

## 2020-11-08 MED ORDER — LOSARTAN POTASSIUM 50 MG PO TABS: 50.0000 mg | ORAL_TABLET | Freq: Every day | ORAL | 0 refills | Status: DC

## 2020-11-08 NOTE — Progress Notes (Signed)
Subjective:    Patient ID: Trevor Mack, male    DOB: 1956-05-06, 64 y.o.   MRN: [id number]  HPI  Pt in for follow.  Pt tells me he is having some dizzy spells on standing. Pt states dizziness on standing is getting worse than it has been in the past he mentions would stand and would wait 15-30 seconds and dizziness would resolve.  Now states even if he states may be dizzy for hours after standing. Then sits down and dizziness is resolved.  Pt has hx of htn. He is on amlodipine and losartan. Bp usually higher. Today is lower than usual.   Pt has no gross motor or sensory function deficits. No ha.   Pt bp is lower today than it has been historically.  No black or bloody stools. No diarrhea. No recent illness. No change in diet/hydration.   Pt has anxiety and it on ativan. He needs update contact and will go ahead get uds. Anxiety controlled. Pt has been on benzo in past for years. Saw psychiatrist who stopped his xanax and rx'd ativan.  Pt has history of copd. He continue to smoke. Sees pulmonologist.   Review of Systems  Constitutional: Negative for chills, fatigue and fever.  HENT: Negative for congestion.   Respiratory: Negative for cough, chest tightness, shortness of breath and wheezing.   Cardiovascular: Negative for chest pain and palpitations.  Gastrointestinal: Negative for abdominal pain.  Genitourinary: Negative for dysuria.  Musculoskeletal: Negative for back pain.  Skin: Negative for rash.  Neurological: Positive for dizziness. Negative for speech difficulty, weakness, numbness and headaches.       Many time when stands and ambulates. But no dizziness presently.  Hematological: Negative for adenopathy. Does not bruise/bleed easily.  Psychiatric/Behavioral: Negative for behavioral problems, decreased concentration, dysphoric mood, sleep disturbance and suicidal ideas. The patient is nervous/anxious. The patient is not hyperactive.    Past Medical History:    Diagnosis Date  . ANXIETY 07/23/2008  . COLONIC POLYPS, HX OF 03/26/2006   MULTIPLE FRAGMENTS OF TUBULAR ADENOMAS POLYPS  . Diverticulosis   . Family history of colon cancer    Father   . GERD 07/23/2008  . Hepatitis C   . HYPERLIPIDEMIA 07/23/2008  . HYPERTENSION 07/23/2008  . TENDINITIS, LEFT THUMB 07/15/2010     Social History   Socioeconomic History  . Marital status: Widowed    Spouse name: Not on file  . Number of children: 0  . Years of education: Not on file  . Highest education level: Not on file  Occupational History    Employer: [employer]  Tobacco Use  . Smoking status: Current Every Day Smoker    Packs/day: 2.00    Years: 35.00    Pack years: 70.00    Types: Cigarettes  . Smokeless tobacco: Never Used  . Tobacco comment: Started smoking at age 75.  Currently smoking 1 1/2 ppd.  Vaping Use  . Vaping Use: Never used  Substance and Sexual Activity  . Alcohol use: No  . Drug use: No  . Sexual activity: Not on file  Other Topics Concern  . Not on file  Social History Narrative  . Not on file   Social Determinants of Health   Financial Resource Strain:   . Difficulty of Paying Living Expenses: Not on file  Food Insecurity:   . Worried About Char[place] fundraiser in the Last Year: Not on file  . Ran Out of Food in the  Last Year: Not on file  Transportation Needs:   . Lack of Transportation (Medical): Not on file  . Lack of Transportation (Non-Medical): Not on file  Physical Activity:   . Days of Exercise per Week: Not on file  . Minutes of Exercise per Session: Not on file  Stress:   . Feeling of Stress : Not on file  Social Connections:   . Frequency of Communication with Friends and Family: Not on file  . Frequency of Social Gatherings with Friends and Family: Not on file  . Attends Religious Services: Not on file  . Active Member of Clubs or Organizations: Not on file  . Attends Archivist Meetings: Not on file  . Marital  Status: Not on file  Intimate Partner Violence:   . Fear of Current or Ex-Partner: Not on file  . Emotionally Abused: Not on file  . Physically Abused: Not on file  . Sexually Abused: Not on file    Past Surgical History:  Procedure Laterality Date  . LUMBAR LAMINECTOMY      Family History  Problem Relation Age of Onset  . Colon cancer Father   . Hypertension Father   . Diabetes Father   . Heart attack Father     No Known Allergies  Current Outpatient Medications on File Prior to Visit  Medication Sig Dispense Refill  . albuterol (PROVENTIL) (2.5 MG/3ML) 0.083% nebulizer solution Take 3 mLs (2.5 mg total) by nebulization every 6 (six) hours as needed for wheezing or shortness of breath. Dx code: J44.9 75 mL 11  . albuterol (VENTOLIN HFA) 108 (90 Base) MCG/ACT inhaler Inhale 1-2 puffs into the lungs every 6 (six) hours as needed. 8 g 5  . amLODipine (NORVASC) 5 MG tablet Take 1 tablet by mouth once daily 90 tablet 0  . atorvastatin (LIPITOR) 40 MG tablet Take 1 tablet by mouth once daily 90 tablet 0  . citalopram (CELEXA) 40 MG tablet Take 1 tablet (40 mg total) by mouth daily. 30 tablet 5  . Fluticasone-Umeclidin-Vilant (TRELEGY ELLIPTA) 100-62.5-25 MCG/INH AEPB Inhale 1 puff into the lungs daily. 60 each 5  . ibuprofen (ADVIL) 200 MG tablet Take 400 mg by mouth every 6 (six) hours as needed.    Marland Kitchen ketoconazole (NIZORAL) 2 % cream Apply 1 application topically daily. 15 g 0  . LORazepam (ATIVAN) 0.5 MG tablet Take 1 tablet by mouth twice daily 60 tablet 0  . losartan (COZAAR) 100 MG tablet Take 1 tablet (100 mg total) by mouth daily. 90 tablet 1  . omeprazole (PRILOSEC) 40 MG capsule Take 1 capsule (40 mg total) by mouth daily. 90 capsule 3  . tamsulosin (FLOMAX) 0.4 MG CAPS capsule Take 1 capsule (0.4 mg total) by mouth daily. 90 capsule 3  . azithromycin (ZITHROMAX) 250 MG tablet Take 2 tablets on day 1, then 1 tablet daily until gone (Patient not taking: Reported on 11/08/2020)  6 tablet 0   No current facility-administered medications on file prior to visit.    BP 100/60 Comment: standing.  Pulse (!) 104   Resp 18   Ht 5\' 9"  (1.753 m)   Wt 217 lb 6.4 oz (98.6 kg)   SpO2 94%   BMI 32.10 kg/m      Objective:   Physical Exam   General Mental Status- Alert. General Appearance- Not in acute distress.   Skin General: Color- Normal Color. Moisture- Normal Moisture.  Neck Carotid Arteries- Normal color. Moisture- Normal Moisture. No carotid bruits. No  JVD.  Chest and Lung Exam Auscultation: Breath Sounds:-Normal.  Cardiovascular Auscultation:Rythm- Regular. Murmurs & Other Heart Sounds:Auscultation of the heart reveals- No Murmurs.  Abdomen Inspection:-Inspeection Normal. Palpation/Percussion:Note:No mass. Palpation and Percussion of the abdomen reveal- Non Tender, Non Distended + BS, no rebound or guarding.    Neurologic Cranial Nerve exam:- CN III-XII intact(No nystagmus), symmetric smile. Drift Test:- No drift. Romberg Exam:- Negative.  Heal to Toe Gait exam:-Normal. Finger to Nose:- Normal/Intact Strength:- 5/5 equal and symmetric strength both upper and lower extremities. On standing for bp check no dizziness.     Assessment & Plan:  You do have lower than usual bp compared to your baseline. This may be playing role in dizziness when you stand though today no dizziness when stood for bp check. Low enough that I want to drop your losartan to 50 mg daily. Continue amlodipine same dose. Get cbc and cmp today t o check blood volume and hydration status.   For anxiety continue ativan. Update uds and contract.  For copd continue trelegy and recommend stop smoking.  Follow up in 1 week or as needed  [employer], PA-C

## 2020-11-08 NOTE — Patient Instructions (Addendum)
You do have lower than usual bp compared to your baseline. This may be playing role in dizziness when you stand though today no dizziness when stood for bp check. Low enough that I want to drop your losartan to 50 mg daily. Continue amlodipine same dose. Get cbc and cmp today t o check blood volume and hydration status.   For anxiety continue ativan. Update uds and contract.  For copd continue trelegy and recommend stop smoking.  Follow up in 1 week or as needed

## 2020-11-10 LAB — DM TEMPLATE

## 2020-11-10 LAB — DRUG MONITORING, PANEL 8 WITH CONFIRMATION, URINE
6 Acetylmorphine: NEGATIVE ng/mL (ref <10)
Alcohol Metabolites: NEGATIVE ng/mL
Alphahydroxyalprazolam: NEGATIVE ng/mL (ref <25)
Alphahydroxymidazolam: NEGATIVE ng/mL (ref <50)
Alphahydroxytriazolam: NEGATIVE ng/mL (ref <50)
Aminoclonazepam: NEGATIVE ng/mL (ref <25)
Amphetamines: NEGATIVE ng/mL (ref <500)
Benzodiazepines: POSITIVE ng/mL — AB (ref <100)
Buprenorphine, Urine: NEGATIVE ng/mL (ref <5)
Cocaine Metabolite: NEGATIVE ng/mL (ref <150)
Creatinine: 234.6 mg/dL
Hydroxyethylflurazepam: NEGATIVE ng/mL (ref <50)
Lorazepam: 682 ng/mL — ABNORMAL HIGH (ref <50)
MDMA: NEGATIVE ng/mL (ref <500)
Marijuana Metabolite: NEGATIVE ng/mL (ref <20)
Nordiazepam: NEGATIVE ng/mL (ref <50)
Opiates: NEGATIVE ng/mL (ref <100)
Oxazepam: NEGATIVE ng/mL (ref <50)
Oxidant: NEGATIVE ug/mL
Oxycodone: NEGATIVE ng/mL (ref <100)
Temazepam: NEGATIVE ng/mL (ref <50)
pH: 5.4 (ref 4.5–9.0)

## 2020-11-15 ENCOUNTER — Encounter: Payer: Self-pay | Admitting: Medical

## 2020-11-15 ENCOUNTER — Ambulatory Visit (INDEPENDENT_AMBULATORY_CARE_PROVIDER_SITE_OTHER): Payer: PPO | Admitting: Medical

## 2020-11-15 ENCOUNTER — Other Ambulatory Visit: Payer: Self-pay

## 2020-11-15 VITALS — BP 122/75 | HR 102 | Resp 20 | Ht 69.0 in | Wt 215.6 lb

## 2020-11-15 DIAGNOSIS — F419 Anxiety disorder, unspecified: Secondary | ICD-10-CM

## 2020-11-15 DIAGNOSIS — K219 Gastro-esophageal reflux disease without esophagitis: Secondary | ICD-10-CM

## 2020-11-15 DIAGNOSIS — Z1211 Encounter for screening for malignant neoplasm of colon: Secondary | ICD-10-CM

## 2020-11-15 DIAGNOSIS — I10 Essential (primary) hypertension: Secondary | ICD-10-CM | POA: Diagnosis not present

## 2020-11-15 DIAGNOSIS — K635 Polyp of colon: Secondary | ICD-10-CM | POA: Diagnosis not present

## 2020-11-15 DIAGNOSIS — E785 Hyperlipidemia, unspecified: Secondary | ICD-10-CM

## 2020-11-15 DIAGNOSIS — R42 Dizziness and giddiness: Secondary | ICD-10-CM | POA: Diagnosis not present

## 2020-11-15 NOTE — Progress Notes (Signed)
Subjective:    Patient ID: Trevor Mack, male    DOB: April 08, 1956, 64 y.o.   MRN: [id number]  HPI  Pt in for follow up.  Pt states his dizziness is a lot better than before. Pt states very random and very limited duration. No gross motor or sensory function deficits.   Last visit decreased his losartan to 50 mg daily from 100 mg a day. No change on his amlodipine.   Pt has chronic anxiety and bp is well controlled.   Pt has copd and he is on trelegy. He states wheezing all the time but is at his baseline.  Pt gerd is well controlled as long as he take his medication.  Hx of high cholesterol. Lipid level well controlled in past.      Review of Systems  Constitutional: Negative for chills, fatigue and fever.  HENT: Negative for congestion, ear discharge and ear pain.   Respiratory: Negative for cough, chest tightness, shortness of breath and wheezing.   Cardiovascular: Negative for chest pain and palpitations.  Gastrointestinal: Negative for abdominal pain, diarrhea, nausea and vomiting.  Genitourinary: Negative for dysuria and flank pain.  Musculoskeletal: Negative for back pain.  Skin: Negative for rash.  Neurological: Negative for dizziness, weakness and light-headedness.  Hematological: Negative for adenopathy. Does not bruise/bleed easily.  Psychiatric/Behavioral: Negative for behavioral problems, decreased concentration, dysphoric mood, hallucinations and sleep disturbance. The patient is nervous/anxious.     Past Medical History:  Diagnosis Date   ANXIETY 07/23/2008   COLONIC POLYPS, HX OF 03/26/2006   MULTIPLE FRAGMENTS OF TUBULAR ADENOMAS POLYPS   Diverticulosis    Family history of colon cancer    Father    GERD 07/23/2008   Hepatitis C    HYPERLIPIDEMIA 07/23/2008   HYPERTENSION 07/23/2008   TENDINITIS, LEFT THUMB 07/15/2010     Social History   Socioeconomic History   Marital status: Widowed    Spouse name: Not on file   Number of children:  0   Years of education: Not on file   Highest education level: Not on file  Occupational History    Employer: [employer]  Tobacco Use   Smoking status: Current Every Day Smoker    Packs/day: 2.00    Years: 35.00    Pack years: 70.00    Types: Cigarettes   Smokeless tobacco: Never Used   Tobacco comment: Started smoking at age 29.  Currently smoking 1 1/2 ppd.  Vaping Use   Vaping Use: Never used  Substance and Sexual Activity   Alcohol use: No   Drug use: No   Sexual activity: Not on file  Other Topics Concern   Not on file  Social History Narrative   Not on file   Social Determinants of Health   Financial Resource Strain:    Difficulty of Paying Living Expenses: Not on file  Food Insecurity:    Worried About [place] in the Last Year: Not on file   Ran Out of Food in the Last Year: Not on file  Transportation Needs:    Lack of Transportation (Medical): Not on file   Lack of Transportation (Non-Medical): Not on file  Physical Activity:    Days of Exercise per Week: Not on file   Minutes of Exercise per Session: Not on file  Stress:    Feeling of Stress : Not on file  Social Connections:    Frequency of Communication with Friends and Family: Not on file  Frequency of Social Gatherings with Friends and Family: Not on file   Attends Religious Services: Not on file   Active Member of Clubs or Organizations: Not on file   Attends Archivist Meetings: Not on file   Marital Status: Not on file  Intimate Partner Violence:    Fear of Current or Ex-Partner: Not on file   Emotionally Abused: Not on file   Physically Abused: Not on file   Sexually Abused: Not on file    Past Surgical History:  Procedure Laterality Date   LUMBAR LAMINECTOMY      Family History  Problem Relation Age of Onset   Colon cancer Father    Hypertension Father    Diabetes Father    Heart attack Father     No Known  Allergies  Current Outpatient Medications on File Prior to Visit  Medication Sig Dispense Refill   albuterol (PROVENTIL) (2.5 MG/3ML) 0.083% nebulizer solution Take 3 mLs (2.5 mg total) by nebulization every 6 (six) hours as needed for wheezing or shortness of breath. Dx code: J44.9 75 mL 11   albuterol (VENTOLIN HFA) 108 (90 Base) MCG/ACT inhaler Inhale 1-2 puffs into the lungs every 6 (six) hours as needed. 8 g 5   amLODipine (NORVASC) 5 MG tablet Take 1 tablet by mouth once daily 90 tablet 0   atorvastatin (LIPITOR) 40 MG tablet Take 1 tablet by mouth once daily 90 tablet 0   azithromycin (ZITHROMAX) 250 MG tablet Take 2 tablets on day 1, then 1 tablet daily until gone 6 tablet 0   citalopram (CELEXA) 40 MG tablet Take 1 tablet (40 mg total) by mouth daily. 30 tablet 5   Fluticasone-Umeclidin-Vilant (TRELEGY ELLIPTA) 100-62.5-25 MCG/INH AEPB Inhale 1 puff into the lungs daily. 60 each 5   ibuprofen (ADVIL) 200 MG tablet Take 400 mg by mouth every 6 (six) hours as needed.     ketoconazole (NIZORAL) 2 % cream Apply 1 application topically daily. 15 g 0   LORazepam (ATIVAN) 0.5 MG tablet Take 1 tablet by mouth twice daily 60 tablet 0   losartan (COZAAR) 100 MG tablet Take 1 tablet (100 mg total) by mouth daily. 90 tablet 1   losartan (COZAAR) 50 MG tablet Take 1 tablet (50 mg total) by mouth daily. 30 tablet 0   omeprazole (PRILOSEC) 40 MG capsule Take 1 capsule (40 mg total) by mouth daily. 90 capsule 3   tamsulosin (FLOMAX) 0.4 MG CAPS capsule Take 1 capsule (0.4 mg total) by mouth daily. 90 capsule 3   No current facility-administered medications on file prior to visit.    BP 122/75    Pulse (!) 102    Resp 20    Ht 5\' 9"  (1.753 m)    Wt 215 lb 9.6 oz (97.8 kg)    SpO2 96%    BMI 31.84 kg/m       Objective:   Physical Exam  General Mental Status- Alert. General Appearance- Not in acute distress.   Skin General: Color- Normal Color. Moisture- Normal  Moisture.  Neck Carotid Arteries- Normal color. Moisture- Normal Moisture. No carotid bruits. No JVD.  Chest and Lung Exam Auscultation: Breath Sounds:-Normal.  Cardiovascular Auscultation:Rythm- Regular. Murmurs & Other Heart Sounds:Auscultation of the heart reveals- No Murmurs.  Abdomen Inspection:-Inspeection Normal. Palpation/Percussion:Note:No mass. Palpation and Percussion of the abdomen reveal- Non Tender, Non Distended + BS, no rebound or guarding.   Neurologic Cranial Nerve exam:- CN III-XII intact(No nystagmus), symmetric smile. Strength:- 5/5 equal and  symmetric strength both upper and lower extremities.      Assessment & Plan:  Dizziness is much better. It does seem that low bp was cause of dizziness. Still be cautious on position change.  Hx of htn. Recently very well/too well controlled as caused some low bp. Cutting back on losartan and bp still in acceptable range. Continue losartan 50 mg daily and amlodipine 5 mg daily.  For copd continue trelegy.   For high cholesterol continue atorvastatin.  Gerd well controlled with omeprazole.  Follow up in 3 months or as needed.  Mackie Pai, PA-C

## 2020-11-15 NOTE — Patient Instructions (Addendum)
Dizziness is much better. It does seem that low bp was cause of dizziness. Still be cautious on position change.  Hx of htn. Recently very well/too well controlled as caused some low bp. Cutting back on losartan and bp still in acceptable range. Continue losartan 50 mg daily and amlodipine 5 mg daily.  For copd continue trelegy.   For high cholesterol continue atorvastatin.  Gerd well controlled with omeprazole.  Follow up in 3 months or as needed.

## 2020-11-16 ENCOUNTER — Encounter: Payer: Self-pay | Admitting: Pulmonary Disease

## 2020-11-16 ENCOUNTER — Ambulatory Visit (INDEPENDENT_AMBULATORY_CARE_PROVIDER_SITE_OTHER): Payer: PPO | Admitting: Pulmonary Disease

## 2020-11-16 VITALS — BP 130/76 | HR 96 | Ht 69.0 in | Wt 217.0 lb

## 2020-11-16 DIAGNOSIS — J441 Chronic obstructive pulmonary disease with (acute) exacerbation: Secondary | ICD-10-CM | POA: Diagnosis not present

## 2020-11-16 DIAGNOSIS — F17210 Nicotine dependence, cigarettes, uncomplicated: Secondary | ICD-10-CM

## 2020-11-16 MED ORDER — ALBUTEROL SULFATE HFA 108 (90 BASE) MCG/ACT IN AERS
1.0000 | INHALATION_SPRAY | Freq: Four times a day (QID) | RESPIRATORY_TRACT | 6 refills | Status: DC | PRN
Start: 2020-11-16 — End: 2021-08-17

## 2020-11-16 MED ORDER — TRELEGY ELLIPTA 100-62.5-25 MCG/INH IN AEPB: 1.0000 | INHALATION_SPRAY | Freq: Every day | RESPIRATORY_TRACT | 5 refills | Status: DC

## 2020-11-16 NOTE — Progress Notes (Signed)
Synopsis: Follow up for COPD  Subjective:   PATIENT ID: Trevor Mack GENDER: male DOB: [birth year], MRN: [id number]  HPI  Chief Complaint  Patient presents with  . Follow-up    for COPD    Trevor Mack is a 64 year old male, daily smoker with history of COPD who returns to pulmonary clinic for follow up of COPD.  He reports his breathing has been stable since last visit. He was treated for acute exacerbation of his COPD at the last visit with prednisone and azithromycin. He continues to use trelegy ellipta daily and as needed albuterol 2-3 times per day. He continues to experience exertional dyspnea. Denies increased sputum production at this time.    He was not able to complete a nocturnal oximetry test due to miscommunications between him and the DME company.   Past Medical History:  Diagnosis Date  . ANXIETY 07/23/2008  . COLONIC POLYPS, HX OF 03/26/2006   MULTIPLE FRAGMENTS OF TUBULAR ADENOMAS POLYPS  . Diverticulosis   . Family history of colon cancer    Father   . GERD 07/23/2008  . Hepatitis C   . HYPERLIPIDEMIA 07/23/2008  . HYPERTENSION 07/23/2008  . TENDINITIS, LEFT THUMB 07/15/2010     Family History  Problem Relation Age of Onset  . Colon cancer Father   . Hypertension Father   . Diabetes Father   . Heart attack Father      Social History   Socioeconomic History  . Marital status: Widowed    Spouse name: Not on file  . Number of children: 0  . Years of education: Not on file  . Highest education level: Not on file  Occupational History    Employer: [employer]  Tobacco Use  . Smoking status: Current Every Day Smoker    Packs/day: 2.00    Years: 35.00    Pack years: 70.00    Types: Cigarettes  . Smokeless tobacco: Never Used  . Tobacco comment: Started smoking at age 72.  Currently smoking 1 1/2 ppd.  Vaping Use  . Vaping Use: Never used  Substance and Sexual Activity  . Alcohol use: No  . Drug use: No  . Sexual activity: Not on  file  Other Topics Concern  . Not on file  Social History Narrative  . Not on file   Social Determinants of Health   Financial Resource Strain:   . Difficulty of Paying Living Expenses: Not on file  Food Insecurity:   . Worried About Charity fundraiser in the Last Year: Not on file  . Ran Out of Food in the Last Year: Not on file  Transportation Needs:   . Lack of Transportation (Medical): Not on file  . Lack of Transportation (Non-Medical): Not on file  Physical Activity:   . Days of Exercise per Week: Not on file  . Minutes of Exercise per Session: Not on file  Stress:   . Feeling of Stress : Not on file  Social Connections:   . Frequency of Communication with Friends and Family: Not on file  . Frequency of Social Gatherings with Friends and Family: Not on file  . Attends Religious Services: Not on file  . Active Member of Clubs or Organizations: Not on file  . Attends Archivist Meetings: Not on file  . Marital Status: Not on file  Intimate Partner Violence:   . Fear of Current or Ex-Partner: Not on file  . Emotionally Abused: Not on file  .  Physically Abused: Not on file  . Sexually Abused: Not on file     No Known Allergies   Outpatient Medications Prior to Visit  Medication Sig Dispense Refill  . albuterol (PROVENTIL) (2.5 MG/3ML) 0.083% nebulizer solution Take 3 mLs (2.5 mg total) by nebulization every 6 (six) hours as needed for wheezing or shortness of breath. Dx code: J44.9 75 mL 11  . amLODipine (NORVASC) 5 MG tablet Take 1 tablet by mouth once daily 90 tablet 0  . atorvastatin (LIPITOR) 40 MG tablet Take 1 tablet by mouth once daily 90 tablet 0  . citalopram (CELEXA) 40 MG tablet Take 1 tablet (40 mg total) by mouth daily. 30 tablet 5  . ibuprofen (ADVIL) 200 MG tablet Take 400 mg by mouth every 6 (six) hours as needed.    Marland Kitchen ketoconazole (NIZORAL) 2 % cream Apply 1 application topically daily. 15 g 0  . LORazepam (ATIVAN) 0.5 MG tablet Take 1 tablet  by mouth twice daily 60 tablet 0  . losartan (COZAAR) 50 MG tablet Take 1 tablet (50 mg total) by mouth daily. 30 tablet 0  . omeprazole (PRILOSEC) 40 MG capsule Take 1 capsule (40 mg total) by mouth daily. 90 capsule 3  . tamsulosin (FLOMAX) 0.4 MG CAPS capsule Take 1 capsule (0.4 mg total) by mouth daily. 90 capsule 3  . albuterol (VENTOLIN HFA) 108 (90 Base) MCG/ACT inhaler Inhale 1-2 puffs into the lungs every 6 (six) hours as needed. 8 g 5  . azithromycin (ZITHROMAX) 250 MG tablet Take 2 tablets on day 1, then 1 tablet daily until gone 6 tablet 0  . Fluticasone-Umeclidin-Vilant (TRELEGY ELLIPTA) 100-62.5-25 MCG/INH AEPB Inhale 1 puff into the lungs daily. 60 each 5  . losartan (COZAAR) 100 MG tablet Take 1 tablet (100 mg total) by mouth daily. 90 tablet 1   No facility-administered medications prior to visit.    Review of Systems  Constitutional: Negative for chills, fever and weight loss.  HENT: Negative for congestion, sinus pain and sore throat.   Eyes: Negative.   Respiratory: Positive for cough, sputum production and shortness of breath. Negative for hemoptysis and wheezing.   Cardiovascular: Positive for leg swelling. Negative for chest pain.  Gastrointestinal: Negative for abdominal pain, heartburn and nausea.  Genitourinary: Negative.   Musculoskeletal: Negative.   Neurological: Negative for dizziness, weakness and headaches.  Endo/Heme/Allergies: Negative.   Psychiatric/Behavioral: Negative.     Objective:   Vitals:   11/16/20 0947  BP: 130/76  Pulse: 96  SpO2: 98%  Weight: 217 lb (98.4 kg)  Height: 5\' 9"  (1.753 m)     Physical Exam Constitutional:      General: He is not in acute distress.    Appearance: He is obese. He is not ill-appearing.  HENT:     Head: Normocephalic and atraumatic.     Nose: Nose normal.     Mouth/Throat:     Mouth: Mucous membranes are moist.     Pharynx: Oropharynx is clear.  Eyes:     General: No scleral icterus.     Conjunctiva/sclera: Conjunctivae normal.  Cardiovascular:     Pulses: Normal pulses.     Heart sounds: Normal heart sounds. No murmur heard.   Pulmonary:     Effort: Pulmonary effort is normal.     Breath sounds: Decreased breath sounds (bilateral bases) and rhonchi (left base) present. No wheezing or rales.  Abdominal:     General: Bowel sounds are normal.     Palpations: Abdomen  is soft.  Musculoskeletal:     Cervical back: Neck supple.     Right lower leg: Edema present.     Left lower leg: Edema present.  Skin:    Capillary Refill: Capillary refill takes less than 2 seconds.  Neurological:     General: No focal deficit present.     Mental Status: He is alert.     Gait: Gait normal.  Psychiatric:        Mood and Affect: Mood normal.        Behavior: Behavior normal.        Thought Content: Thought content normal.        Judgment: Judgment normal.    CBC    Component Value Date/Time   WBC 11.3 (H) 11/08/2020 1131   RBC 5.52 11/08/2020 1131   HGB 15.5 11/08/2020 1131   HCT 46.5 11/08/2020 1131   PLT 188.0 11/08/2020 1131   MCV 84.1 11/08/2020 1131   MCHC 33.3 11/08/2020 1131   RDW 15.3 11/08/2020 1131   LYMPHSABS 2.2 11/08/2020 1131   MONOABS 0.6 11/08/2020 1131   EOSABS 0.1 11/08/2020 1131   BASOSABS 0.1 11/08/2020 1131   BMP Latest Ref Rng & Units 11/08/2020 05/26/2020 06/23/2019  Glucose 70 - 99 mg/dL 76 93 91  BUN 6 - 23 mg/dL 13 19 13   Creatinine 0.40 - 1.50 mg/dL 1.06 1.02 0.96  Sodium 135 - 145 mEq/L 138 137 140  Potassium 3.5 - 5.1 mEq/L 3.9 4.1 3.6  Chloride 96 - 112 mEq/L 100 101 102  CO2 19 - 32 mEq/L 29 29 27   Calcium 8.4 - 10.5 mg/dL 8.8 8.8 8.5     Chest imaging: CXR 10/2018 There is no edema or consolidation. The heart size and pulmonary vascularity are normal. No adenopathy. No evident bone lesions.  PFT: PFT Results Latest Ref Rng & Units 04/12/2017  FVC-Pre L 2.43  FVC-Predicted Pre % 52  FVC-Post L 2.85  FVC-Predicted Post % 61  Pre  FEV1/FVC % % 62  Post FEV1/FCV % % 61  FEV1-Pre L 1.49  FEV1-Predicted Pre % 42  FEV1-Post L 1.75  DLCO uncorrected ml/min/mmHg 12.29  DLCO UNC% % 39  DLCO corrected ml/min/mmHg 11.82  DLCO COR %Predicted % 38  DLVA Predicted % 61  TLC L 7.02  TLC % Predicted % 103  RV % Predicted % 214     Assessment & Plan:   Chronic obstructive lung disease with acute on chronic bronchitis and exacerbation gold stage C - Plan: Pulse oximetry, overnight  Cigarette smoker - Plan: Ambulatory Referral for Lung Cancer Scre  Discussion: Trevor Mack is a 64 year old male, daily smoker with history of COPD who returns to pulmonary clinic for follow up.  His breathing has been stable since the last visit and treatment for acute excarebation of COPD. He remains on trelegy ellipta and as needed albuterol.   Given his history of COPD and concern for sleep apnea, we will perform nocturnal oximetry to determine if he would benefit from nocturnal oxygen therapy.   Based on his smoking history, he does qualify for lung cancer screening. Referral made to our lung cancer screening program.   He is not interested in smoking cessation aids at this time.  Patient to follow up in 4 months.  Freda Jackson, MD [facility] Office: [phone]   See Amion for Pager Details     Current Outpatient Medications:  .  albuterol (PROVENTIL) (2.5 MG/3ML) 0.083% nebulizer  solution, Take 3 mLs (2.5 mg total) by nebulization every 6 (six) hours as needed for wheezing or shortness of breath. Dx code: J44.9, Disp: 75 mL, Rfl: 11 .  albuterol (VENTOLIN HFA) 108 (90 Base) MCG/ACT inhaler, Inhale 1-2 puffs into the lungs every 6 (six) hours as needed., Disp: 18 g, Rfl: 6 .  amLODipine (NORVASC) 5 MG tablet, Take 1 tablet by mouth once daily, Disp: 90 tablet, Rfl: 0 .  atorvastatin (LIPITOR) 40 MG tablet, Take 1 tablet by mouth once daily, Disp: 90 tablet, Rfl: 0 .  citalopram (CELEXA) 40 MG  tablet, Take 1 tablet (40 mg total) by mouth daily., Disp: 30 tablet, Rfl: 5 .  Fluticasone-Umeclidin-Vilant (TRELEGY ELLIPTA) 100-62.5-25 MCG/INH AEPB, Inhale 1 puff into the lungs daily., Disp: 60 each, Rfl: 5 .  ibuprofen (ADVIL) 200 MG tablet, Take 400 mg by mouth every 6 (six) hours as needed., Disp: , Rfl:  .  ketoconazole (NIZORAL) 2 % cream, Apply 1 application topically daily., Disp: 15 g, Rfl: 0 .  LORazepam (ATIVAN) 0.5 MG tablet, Take 1 tablet by mouth twice daily, Disp: 60 tablet, Rfl: 0 .  losartan (COZAAR) 50 MG tablet, Take 1 tablet (50 mg total) by mouth daily., Disp: 30 tablet, Rfl: 0 .  omeprazole (PRILOSEC) 40 MG capsule, Take 1 capsule (40 mg total) by mouth daily., Disp: 90 capsule, Rfl: 3 .  tamsulosin (FLOMAX) 0.4 MG CAPS capsule, Take 1 capsule (0.4 mg total) by mouth daily., Disp: 90 capsule, Rfl: 3

## 2020-11-16 NOTE — Patient Instructions (Addendum)
Continue Trelegy inhaler daily Continue albuterol inhaler as needed 1-2 puffs every 4-6 hours  We will follow up on the nocturnal oximetry study   Please call our clinic if your symptoms worsen prior to next follow up visit.

## 2020-11-16 NOTE — Addendum Note (Signed)
Addended by: Freda Jackson on: 11/16/2020 02:12 PM   Modules accepted: Orders

## 2020-11-22 ENCOUNTER — Other Ambulatory Visit: Payer: Self-pay | Admitting: Medical

## 2020-11-22 NOTE — Telephone Encounter (Signed)
Requesting: ativan Contract:07/2020 UDS:07/2020 Last Visit:11/16/20 Next Visit:222 Last Refill:10/26  Please Advise

## 2020-11-23 DIAGNOSIS — R0683 Snoring: Secondary | ICD-10-CM | POA: Diagnosis not present

## 2020-11-23 DIAGNOSIS — G4730 Sleep apnea, unspecified: Secondary | ICD-10-CM | POA: Diagnosis not present

## 2020-11-23 NOTE — Telephone Encounter (Signed)
Patient is calling in reference to medication refill status

## 2020-11-24 NOTE — Telephone Encounter (Signed)
Patient states he need his medication, calling to check the status of refill

## 2020-11-24 NOTE — Telephone Encounter (Signed)
Patient states he needs his medication , patient is calling to check to status.

## 2020-11-25 NOTE — Telephone Encounter (Signed)
Patient is calling to check status of medication refill please advise

## 2020-11-25 NOTE — Telephone Encounter (Signed)
Refilled pt ativan today.

## 2020-12-07 ENCOUNTER — Telehealth: Payer: Self-pay | Admitting: Acute Care

## 2020-12-07 DIAGNOSIS — F17210 Nicotine dependence, cigarettes, uncomplicated: Secondary | ICD-10-CM

## 2020-12-07 DIAGNOSIS — Z87891 Personal history of nicotine dependence: Secondary | ICD-10-CM

## 2020-12-09 ENCOUNTER — Other Ambulatory Visit: Payer: Self-pay | Admitting: Medical

## 2020-12-09 NOTE — Telephone Encounter (Signed)
Spoke with pt and scheduled SDMV 01/19/21 9:00 CT ordered Nothing further needed at this time.

## 2020-12-20 ENCOUNTER — Other Ambulatory Visit: Payer: Self-pay | Admitting: Medical

## 2020-12-24 ENCOUNTER — Other Ambulatory Visit: Payer: Self-pay | Admitting: Medical

## 2020-12-27 NOTE — Telephone Encounter (Signed)
Ok, thanks.

## 2020-12-27 NOTE — Telephone Encounter (Signed)
12/23/2020 contract date , last Thursday

## 2020-12-27 NOTE — Telephone Encounter (Signed)
I sent in clonazepam. Is his contract up to date. Did he sign one just recently? 12/30. That is a saturday Was that last year?  He needs update correct.   If so please have him come in to signs contract.

## 2020-12-27 NOTE — Telephone Encounter (Signed)
Requesting: ativan Contract:12/30 UDS:11/08/20 Last Visit:11/15/20 Next Visit: n/a Last Refill:11/25/2020  Please Advise

## 2021-01-19 ENCOUNTER — Encounter: Payer: Self-pay | Admitting: Acute Care

## 2021-01-19 ENCOUNTER — Other Ambulatory Visit: Payer: Self-pay

## 2021-01-19 ENCOUNTER — Ambulatory Visit (INDEPENDENT_AMBULATORY_CARE_PROVIDER_SITE_OTHER): Payer: PPO | Admitting: Acute Care

## 2021-01-19 ENCOUNTER — Ambulatory Visit (HOSPITAL_BASED_OUTPATIENT_CLINIC_OR_DEPARTMENT_OTHER)
Admission: RE | Admit: 2021-01-19 | Discharge: 2021-01-19 | Disposition: A | Discharge status: Home / Self Care | Payer: PPO | Source: Ambulatory Visit | Attending: Acute Care | Admitting: Acute Care

## 2021-01-19 ENCOUNTER — Telehealth: Payer: Self-pay | Admitting: Acute Care

## 2021-01-19 VITALS — BP 124/72 | HR 102 | Temp 97.0°F | Ht 69.0 in | Wt 212.4 lb

## 2021-01-19 DIAGNOSIS — F17210 Nicotine dependence, cigarettes, uncomplicated: Secondary | ICD-10-CM

## 2021-01-19 DIAGNOSIS — Z87891 Personal history of nicotine dependence: Secondary | ICD-10-CM | POA: Diagnosis not present

## 2021-01-19 DIAGNOSIS — Z122 Encounter for screening for malignant neoplasm of respiratory organs: Secondary | ICD-10-CM

## 2021-01-19 NOTE — Telephone Encounter (Signed)
Pt needs his 4 month follow up scheduled with Dr. Erin Fulling. Thanks so much

## 2021-01-19 NOTE — Telephone Encounter (Signed)
Dr. Erin Fulling schedule is not available yet. Put in recall for 4 months. Nothing further needed.

## 2021-01-19 NOTE — Progress Notes (Signed)
Shared Decision Making Visit Lung Cancer Screening Program [phone])   Eligibility:  Age 65 y.o.  Pack Years Smoking History Calculation 94 pack year smoking history (# packs/per year x # years smoked)  Recent History of coughing up blood  no  Unexplained weight loss? no ( >Than 15 pounds within the last 6 months )  Prior History Lung / other cancer no (Diagnosis within the last 5 years already requiring surveillance chest CT Scans).  Smoking Status Current Smoker  Former Smokers: Years since quit: NA  Quit Date: NA  Visit Components:  Discussion included one or more decision making aids. yes  Discussion included risk/benefits of screening. yes  Discussion included potential follow up diagnostic testing for abnormal scans. yes  Discussion included meaning and risk of over diagnosis. yes  Discussion included meaning and risk of False Positives. yes  Discussion included meaning of total radiation exposure. yes  Counseling Included:  Importance of adherence to annual lung cancer LDCT screening. yes  Impact of comorbidities on ability to participate in the program. yes  Ability and willingness to under diagnostic treatment. yes  Smoking Cessation Counseling:  Current Smokers:   Discussed importance of smoking cessation. yes  Information about tobacco cessation classes and interventions provided to patient. yes  Patient provided with "ticket" for LDCT Scan. yes  Symptomatic Patient. NA  Counseling  Diagnosis Code: Tobacco Use Z72.0  Asymptomatic Patient yes  Counseling (Intermediate counseling: > three minutes counseling) L8756  Former Smokers:   Discussed the importance of maintaining cigarette abstinence. yes  Diagnosis Code: Personal History of Nicotine Dependence. E33.295  Information about tobacco cessation classes and interventions provided to patient. Yes  Patient provided with "ticket" for LDCT Scan. yes  Written Order for Lung Cancer  Screening with LDCT placed in Epic. Yes (CT Chest Lung Cancer Screening Low Dose W/O CM) [id number] Z12.2-Screening of respiratory organs Z87.891-Personal history of nicotine dependence   I have spent 25 minutes of face to face time with Mr. Kerney discussing the risks and benefits of lung cancer screening. We viewed a power point together that explained in detail the above noted topics. We paused at intervals to allow for questions to be asked and answered to ensure understanding.We discussed that the single most powerful action that he can take to decrease his risk of developing lung cancer is to quit smoking. We discussed whether or not he is ready to commit to setting a quit date. We discussed options for tools to aid in quitting smoking including nicotine replacement therapy, non-nicotine medications, support groups, Quit Smart classes, and behavior modification. We discussed that often times setting smaller, more achievable goals, such as eliminating 1 cigarette a day for a week and then 2 cigarettes a day for a week can be helpful in slowly decreasing the number of cigarettes smoked. This allows for a sense of accomplishment as well as providing a clinical benefit. I gave him the " Be Stronger Than Your Excuses" card with contact information for community resources, classes, free nicotine replacement therapy, and access to mobile apps, text messaging, and on-line smoking cessation help. I have also given him my card and contact information in the event he needs to contact me. We discussed the time and location of the scan, and that either Doroteo Glassman RN or I will call with the results within 24-48 hours of receiving them. I have offered him  a copy of the power point we viewed  as a resource in the event they need reinforcement  of the concepts we discussed today in the office. The patient verbalized understanding of all of  the above and had no further questions upon leaving the office. They have my  contact information in the event they have any further questions.  I spent 3 minutes counseling on smoking cessation and the health risks of continued tobacco abuse.  I explained to the patient that there has been a high incidence of coronary artery disease noted on these exams. I explained that this is a non-gated exam therefore degree or severity cannot be determined. This patient is currently on statin therapy. I have asked the patient to follow-up with their PCP regarding any incidental finding of coronary artery disease and management with diet or medication as their PCP  feels is clinically indicated. The patient verbalized understanding of the above and had no further questions upon completion of the visit.     Magdalen Spatz, NP 01/19/2021

## 2021-01-19 NOTE — Patient Instructions (Signed)
Thank you for participating in the [facility]. It was our pleasure to meet you today. We will call you with the results of your scan within the next few days. Your scan will be assigned a Lung RADS category score by the physicians reading the scans.  This Lung RADS score determines follow up scanning.  See below for description of categories, and follow up screening recommendations. We will be in touch to schedule your follow up screening annually or based on recommendations of our providers. We will fax a copy of your scan results to your Primary Care Physician, or the physician who referred you to the program, to ensure they have the results. Please call the office if you have any questions or concerns regarding your scanning experience or results.  Our office number is [phone]. Please speak with Denise Phelps, RN. She is our Lung Cancer Screening RN. If she is unavailable when you call, please have the office staff send her a message. She will return your call at her earliest convenience. Remember, if your scan is normal, we will scan you annually as long as you continue to meet the criteria for the program. (Age 55-77, Current smoker or smoker who has quit within the last 15 years). If you are a smoker, remember, quitting is the single most powerful action that you can take to decrease your risk of lung cancer and other pulmonary, breathing related problems. We know quitting is hard, and we are here to help.  Please let us know if there is anything we can do to help you meet your goal of quitting. If you are a former smoker, congratulations. We are proud of you! Remain smoke free! Remember you can refer friends or family members through the number above.  We will screen them to make sure they meet criteria for the program. Thank you for helping us take better care of you by participating in Lung Screening.  Lung RADS Categories:  Lung RADS 1: no nodules  or definitely non-concerning nodules.  Recommendation is for a repeat annual scan in 12 months.  Lung RADS 2:  nodules that are non-concerning in appearance and behavior with a very low likelihood of becoming an active cancer. Recommendation is for a repeat annual scan in 12 months.  Lung RADS 3: nodules that are probably non-concerning , includes nodules with a low likelihood of becoming an active cancer.  Recommendation is for a 6-month repeat screening scan. Often noted after an upper respiratory illness. We will be in touch to make sure you have no questions, and to schedule your 6-month scan.  Lung RADS 4 A: nodules with concerning findings, recommendation is most often for a follow up scan in 3 months or additional testing based on our provider's assessment of the scan. We will be in touch to make sure you have no questions and to schedule the recommended 3 month follow up scan.  Lung RADS 4 B:  indicates findings that are concerning. We will be in touch with you to schedule additional diagnostic testing based on our provider's  assessment of the scan.   

## 2021-01-24 ENCOUNTER — Ambulatory Visit (AMBULATORY_SURGERY_CENTER): Payer: BC Managed Care – PPO | Admitting: *Deleted

## 2021-01-24 ENCOUNTER — Other Ambulatory Visit: Payer: Self-pay | Admitting: Medical

## 2021-01-24 ENCOUNTER — Other Ambulatory Visit: Payer: Self-pay

## 2021-01-24 VITALS — Ht 69.0 in | Wt 210.0 lb

## 2021-01-24 DIAGNOSIS — Z800 Family history of malignant neoplasm of digestive organs: Secondary | ICD-10-CM

## 2021-01-24 DIAGNOSIS — Z86010 Personal history of colonic polyps: Secondary | ICD-10-CM

## 2021-01-24 MED ORDER — SUTAB 1479-225-188 MG PO TABS: 1.0000 | ORAL_TABLET | ORAL | 0 refills | Status: DC

## 2021-01-24 NOTE — Progress Notes (Signed)
Patient's pre-visit was done today over the phone with the patient due to COVID-19 pandemic. Name,DOB and address verified. Insurance verified. Patient denies any allergies to Eggs and Soy. Patient denies any problems with anesthesia/sedation. Patient denies taking diet pills or blood thinners. Packet of Prep instructions mailed to patient including a copy of a consent form and pre-procedure patient acknowledgement form (with envelope to mail back to us)-pt is aware. sutab Coupon included. Patient understands to call us back with any questions or concerns. COVID-19 vaccines completed on 04/06/2020 x2, per patient. Patient is aware of our care-partner policy and [id number] safety protocol.

## 2021-01-25 ENCOUNTER — Other Ambulatory Visit: Payer: Self-pay | Admitting: Medical

## 2021-01-25 NOTE — Telephone Encounter (Addendum)
Requesting: Lorazepam  Contract: 11/08/18 UDS: 11/08/20 Last Visit: 11/15/20 Next Visit: 02/17/21 Last Refill: 12/27/20   Please Advise  I reviewed and I don't think he is up to date on contract. Please get him scheduled to sign  contract by Thursday. Then will lorazapam.

## 2021-01-26 ENCOUNTER — Telehealth: Payer: Self-pay | Admitting: Acute Care

## 2021-01-26 DIAGNOSIS — F17210 Nicotine dependence, cigarettes, uncomplicated: Secondary | ICD-10-CM

## 2021-01-26 DIAGNOSIS — Z87891 Personal history of nicotine dependence: Secondary | ICD-10-CM

## 2021-01-26 MED ORDER — LORAZEPAM 0.5 MG PO TABS: 0.5000 mg | ORAL_TABLET | Freq: Two times a day (BID) | ORAL | 0 refills | Status: DC

## 2021-01-26 NOTE — Telephone Encounter (Signed)
Patient is requesting a refill for medication. Please advise    Medication: LORazepam (ATIVAN) 0.5 MG tablet [[id number]]    Has the patient contacted their pharmacy? No. (If no, request that the patient contact the pharmacy for the refill.) (If yes, when and what did the pharmacy advise?)  Preferred Pharmacy (with phone number or street name):  [facility]  [address], [place] [address]  Phone:  [phone] Fax:  [phone]  DEA #:  --  [id number] Reason: --     Agent: Please be advised that RX refills may take up to 3 business days. We ask that you follow-up with your pharmacy.

## 2021-01-26 NOTE — Telephone Encounter (Signed)
Please get pt to update his contract.

## 2021-01-26 NOTE — Telephone Encounter (Addendum)
Contract up to date 11/08/2020  Since contract up to date refilled med today. Media contract is dated. 12-23-20.

## 2021-01-26 NOTE — Telephone Encounter (Signed)
Pt informed of CT results per Sarah Groce, NP.  PT verbalized understanding.  Copy sent to PCP.  Order placed for 1 yr f/u CT.  

## 2021-01-27 NOTE — Progress Notes (Signed)
Please call patient and let them  know their  low dose Ct was read as a Lung RADS 2: nodules that are benign in appearance and behavior with a very low likelihood of becoming a clinically active cancer due to size or lack of growth. Recommendation per radiology is for a repeat LDCT in 12 months. .Please let them  know we will order and schedule their  annual screening scan for 12/2021 Please let them  know there was notation of CAD on their  scan.  Please remind the patient  that this is a non-gated exam therefore degree or severity of disease  cannot be determined. Please have them  follow up with their PCP regarding potential risk factor modification, dietary therapy or pharmacologic therapy if clinically indicated. Pt.  is  currently on statin therapy. Please place order for annual  screening scan for  12/2021 and fax results to PCP. Thanks so much. 

## 2021-02-07 ENCOUNTER — Encounter: Payer: Self-pay | Admitting: Internal Medicine

## 2021-02-07 ENCOUNTER — Ambulatory Visit (AMBULATORY_SURGERY_CENTER): Payer: PPO | Admitting: Internal Medicine

## 2021-02-07 ENCOUNTER — Other Ambulatory Visit: Payer: Self-pay

## 2021-02-07 VITALS — BP 110/66 | HR 87 | Temp 97.3°F | Resp 14 | Ht 69.0 in | Wt 210.0 lb

## 2021-02-07 DIAGNOSIS — D123 Benign neoplasm of transverse colon: Secondary | ICD-10-CM

## 2021-02-07 DIAGNOSIS — Z860100 Personal history of colon polyps, unspecified: Secondary | ICD-10-CM

## 2021-02-07 DIAGNOSIS — K621 Rectal polyp: Secondary | ICD-10-CM

## 2021-02-07 DIAGNOSIS — D120 Benign neoplasm of cecum: Secondary | ICD-10-CM

## 2021-02-07 DIAGNOSIS — D122 Benign neoplasm of ascending colon: Secondary | ICD-10-CM

## 2021-02-07 DIAGNOSIS — D125 Benign neoplasm of sigmoid colon: Secondary | ICD-10-CM | POA: Diagnosis not present

## 2021-02-07 DIAGNOSIS — D128 Benign neoplasm of rectum: Secondary | ICD-10-CM

## 2021-02-07 DIAGNOSIS — D124 Benign neoplasm of descending colon: Secondary | ICD-10-CM | POA: Diagnosis not present

## 2021-02-07 DIAGNOSIS — I10 Essential (primary) hypertension: Secondary | ICD-10-CM | POA: Diagnosis not present

## 2021-02-07 DIAGNOSIS — Z1211 Encounter for screening for malignant neoplasm of colon: Secondary | ICD-10-CM | POA: Diagnosis not present

## 2021-02-07 DIAGNOSIS — J449 Chronic obstructive pulmonary disease, unspecified: Secondary | ICD-10-CM | POA: Diagnosis not present

## 2021-02-07 MED ORDER — SODIUM CHLORIDE 0.9 % IV SOLN: 500.0000 mL | Freq: Once | INTRAVENOUS | Status: DC

## 2021-02-07 NOTE — Patient Instructions (Signed)
YOU HAD AN ENDOSCOPIC PROCEDURE TODAY AT THE [facility]:   Refer to the procedure report that was given to you for any specific questions about what was found during the examination.  If the procedure report does not answer your questions, please call your gastroenterologist to clarify.  If you requested that your care partner not be given the details of your procedure findings, then the procedure report has been included in a sealed envelope for you to review at your convenience later.  YOU SHOULD EXPECT: Some feelings of bloating in the abdomen. Passage of more gas than usual.  Walking can help get rid of the air that was put into your GI tract during the procedure and reduce the bloating. If you had a lower endoscopy (such as a colonoscopy or flexible sigmoidoscopy) you may notice spotting of blood in your stool or on the toilet paper. If you underwent a bowel prep for your procedure, you may not have a normal bowel movement for a few days.  Please Note:  You might notice some irritation and congestion in your nose or some drainage.  This is from the oxygen used during your procedure.  There is no need for concern and it should clear up in a day or so.  SYMPTOMS TO REPORT IMMEDIATELY:   Following lower endoscopy (colonoscopy or flexible sigmoidoscopy):  Excessive amounts of blood in the stool  Significant tenderness or worsening of abdominal pains  Swelling of the abdomen that is new, acute  Fever of 100F or higher  For urgent or emergent issues, a gastroenterologist can be reached at any hour by calling [phone]. Do not use MyChart messaging for urgent concerns.    DIET:  We do recommend a small meal at first, but then you may proceed to your regular diet.  Drink plenty of fluids but you should avoid alcoholic beverages for 24 hours.  ACTIVITY:  You should plan to take it easy for the rest of today and you should NOT DRIVE or use heavy machinery until tomorrow (because  of the sedation medicines used during the test).    FOLLOW UP: Our staff will call the number listed on your records 48-72 hours following your procedure to check on you and address any questions or concerns that you may have regarding the information given to you following your procedure. If we do not reach you, we will leave a message.  We will attempt to reach you two times.  During this call, we will ask if you have developed any symptoms of COVID 19. If you develop any symptoms (ie: fever, flu-like symptoms, shortness of breath, cough etc.) before then, please call [phone].  If you test positive for Covid 19 in the 2 weeks post procedure, please call and report this information to us.    If any biopsies were taken you will be contacted by phone or by letter within the next 1-3 weeks.  Please call us at [phone] if you have not heard about the biopsies in 3 weeks.    SIGNATURES/CONFIDENTIALITY: You and/or your care partner have signed paperwork which will be entered into your electronic medical record.  These signatures attest to the fact that that the information above on your After Visit Summary has been reviewed and is understood.  Full responsibility of the confidentiality of this discharge information lies with you and/or your care-partner. 

## 2021-02-07 NOTE — Op Note (Signed)
[facility] Patient Name: Trevor Mack Procedure Date: 02/07/2021 10:40 AM MRN: [id number] Endoscopist: Jerene Bears , MD Age: 65 Referring MD:  Date of Birth: 07/04/1956 Gender: Male Account #: [id number] Procedure:                Colonoscopy Indications:              High risk colon cancer surveillance: Personal                            history of multiple adenomas (at colonoscopy in                            1999, 2007, 2008, and 2013), Last colonoscopy:                            December 2013; family history of colon cancer in                            father Medicines:                Monitored Anesthesia Care Procedure:                Pre-Anesthesia Assessment:                           - Prior to the procedure, a History and Physical                            was performed, and patient medications and                            allergies were reviewed. The patient's tolerance of                            previous anesthesia was also reviewed. The risks                            and benefits of the procedure and the sedation                            options and risks were discussed with the patient.                            All questions were answered, and informed consent                            was obtained. Prior Anticoagulants: The patient has                            taken no previous anticoagulant or antiplatelet                            agents. ASA Grade Assessment: III - A patient with  severe systemic disease. After reviewing the risks                            and benefits, the patient was deemed in                            satisfactory condition to undergo the procedure.                           After obtaining informed consent, the colonoscope                            was passed under direct vision. Throughout the                            procedure, the patient's blood pressure, pulse, and                             oxygen saturations were monitored continuously. The                            Olympus CF-HQ190 [id number]) Colonoscope was                            introduced through the anus and advanced to the                            cecum, identified by appendiceal orifice and                            ileocecal valve. The colonoscopy was performed                            without difficulty. The patient tolerated the                            procedure well. The quality of the bowel                            preparation was good. The ileocecal valve,                            appendiceal orifice, and rectum were photographed.                            The bowel preparation used was 2 day MiraLax and                            SuTab via split dose instruction. Scope In: 10:47:45 AM Scope Out: 11:18:55 AM Scope Withdrawal Time: 0 hours 30 minutes 0 seconds  Total Procedure Duration: 0 hours 31 minutes 10 seconds  Findings:                 The digital rectal exam was normal.  Two sessile polyps were found in the cecum. The                            polyps were 4 to 6 mm in size. These polyps were                            removed with a cold snare. Resection and retrieval                            were complete.                           Two sessile polyps were found in the ascending                            colon. The polyps were 6 to 7 mm in size. These                            polyps were removed with a cold snare. Resection                            and retrieval were complete.                           Two sessile polyps were found in the transverse                            colon. The polyps were 6 to 7 mm in size. These                            polyps were removed with a cold snare. Resection                            and retrieval were complete.                           Two pedunculated polyps were found in the                             transverse colon. The polyps were 15 to 25 mm in                            size. These polyps were removed with a hot snare.                            Resection and retrieval were complete.                           Two pedunculated polyps were found in the                            descending colon. The polyps were 20 to 25 mm in  size. These polyps were removed with a hot snare.                            Resection and retrieval were complete.                           Two pedunculated polyps were found in the sigmoid                            colon. The polyps were 15 to 20 mm in size. These                            polyps were removed with a hot snare. Resection and                            retrieval were complete.                           A 8 mm polyp was found in the sigmoid colon. The                            polyp was sessile. The polyp was removed with a                            cold snare. Resection and retrieval were complete.                           A 6 mm polyp was found in the rectum. The polyp was                            sessile. The polyp was removed with a cold snare.                            Resection and retrieval were complete.                           Internal hemorrhoids were found during                            retroflexion. The hemorrhoids were small. Complications:            No immediate complications. Estimated Blood Loss:     Estimated blood loss was minimal. Impression:               - Two 4 to 6 mm polyps in the cecum, removed with a                            cold snare. Resected and retrieved.                           - Two 6 to 7 mm polyps in the ascending colon,  removed with a cold snare. Resected and retrieved.                           - Two 6 to 7 mm polyps in the transverse colon,                            removed with a cold snare. Resected and retrieved.                            - Two 15 to 25 mm polyps in the transverse colon,                            removed with a hot snare. Resected and retrieved.                           - Two 20 to 25 mm polyps in the descending colon,                            removed with a hot snare. Resected and retrieved.                           - Two 15 to 20 mm polyps in the sigmoid colon,                            removed with a hot snare. Resected and retrieved.                           - One 8 mm polyp in the sigmoid colon, removed with                            a cold snare. Resected and retrieved.                           - One 6 mm polyp in the rectum, removed with a cold                            snare. Resected and retrieved.                           - Small internal hemorrhoids. Recommendation:           - Patient has a contact number available for                            emergencies. The signs and symptoms of potential                            delayed complications were discussed with the                            patient. Return to normal activities tomorrow.  Written discharge instructions were provided to the                            patient.                           - Resume previous diet.                           - Continue present medications.                           - No ibuprofen, naproxen, or other non-steroidal                            anti-inflammatory drugs for 3 weeks after polyp                            removal.                           - Await pathology results.                           - Repeat colonoscopy for surveillance of multiple                            polyps likely in 1 year. Jerene Bears, MD 02/07/2021 11:26:31 AM This report has been signed electronically.

## 2021-02-07 NOTE — Progress Notes (Signed)
M.O. vital signs.

## 2021-02-07 NOTE — Progress Notes (Signed)
Pt's states no medical or surgical changes since previsit or office visit. 

## 2021-02-07 NOTE — Progress Notes (Signed)
1050 Robinul 0.2 mg IV given due large amount of secretions upon assessment.  MD made aware, vss

## 2021-02-07 NOTE — Progress Notes (Signed)
Report given to PACU, vss 

## 2021-02-07 NOTE — Progress Notes (Signed)
Called to room to assist during endoscopic procedure.  Patient ID and intended procedure confirmed with present staff. Received instructions for my participation in the procedure from the performing physician.  

## 2021-02-09 ENCOUNTER — Telehealth: Payer: Self-pay | Admitting: *Deleted

## 2021-02-09 ENCOUNTER — Telehealth: Payer: Self-pay

## 2021-02-09 NOTE — Telephone Encounter (Signed)
LVM

## 2021-02-09 NOTE — Telephone Encounter (Signed)
Attempted f/u phone call. No answer. Left message. °

## 2021-02-10 ENCOUNTER — Encounter: Payer: Self-pay | Admitting: Internal Medicine

## 2021-02-17 ENCOUNTER — Ambulatory Visit (INDEPENDENT_AMBULATORY_CARE_PROVIDER_SITE_OTHER): Payer: PPO | Admitting: Medical

## 2021-02-17 ENCOUNTER — Other Ambulatory Visit: Payer: Self-pay

## 2021-02-17 VITALS — BP 127/72 | HR 89 | Temp 98.2°F | Resp 18 | Ht 69.0 in | Wt 208.4 lb

## 2021-02-17 DIAGNOSIS — I10 Essential (primary) hypertension: Secondary | ICD-10-CM

## 2021-02-17 DIAGNOSIS — K219 Gastro-esophageal reflux disease without esophagitis: Secondary | ICD-10-CM

## 2021-02-17 DIAGNOSIS — F419 Anxiety disorder, unspecified: Secondary | ICD-10-CM | POA: Diagnosis not present

## 2021-02-17 DIAGNOSIS — E785 Hyperlipidemia, unspecified: Secondary | ICD-10-CM

## 2021-02-17 DIAGNOSIS — J441 Chronic obstructive pulmonary disease with (acute) exacerbation: Secondary | ICD-10-CM

## 2021-02-17 LAB — LIPID PANEL
Cholesterol: 117 mg/dL (ref 0–200)
HDL: 26.4 mg/dL — ABNORMAL LOW (ref >39.00)
LDL Cholesterol: 63 mg/dL (ref 0–99)
NonHDL: 90.87
Total CHOL/HDL Ratio: 4
Triglycerides: 137 mg/dL (ref 0.0–149.0)
VLDL: 27.4 mg/dL (ref 0.0–40.0)

## 2021-02-17 LAB — COMPREHENSIVE METABOLIC PANEL
ALT: 19 U/L (ref 0–53)
AST: 14 U/L (ref 0–37)
Albumin: 4.3 g/dL (ref 3.5–5.2)
Alkaline Phosphatase: 128 U/L — ABNORMAL HIGH (ref 39–117)
BUN: 12 mg/dL (ref 6–23)
CO2: 30 mEq/L (ref 19–32)
Calcium: 8.8 mg/dL (ref 8.4–10.5)
Chloride: 101 mEq/L (ref 96–112)
Creatinine, Ser: 0.99 mg/dL (ref 0.40–1.50)
GFR: 80.89 mL/min (ref >60.00)
Glucose, Bld: 93 mg/dL (ref 70–99)
Potassium: 3.9 mEq/L (ref 3.5–5.1)
Sodium: 139 mEq/L (ref 135–145)
Total Bilirubin: 0.7 mg/dL (ref 0.2–1.2)
Total Protein: 7.1 g/dL (ref 6.0–8.3)

## 2021-02-17 NOTE — Progress Notes (Signed)
Subjective:    Patient ID: Trevor Mack, male    DOB: [birth year], 65 y.o.   MRN: [id number]  HPI  Pt in for follow up.  Recent new repeat colonoscopy and 14 polyps found. Told repeat in one year.  Pt anxiety is controlled with ativan. uds and contract up to date. [facility] drug data base reviewed. Pt not  due for refill of ativan yet.  Hx of htn. Recently very well with  losartan 50 mg daily and amlodipine 5 mg daily.  For copd on trelegy. Followed by pulmonologist.  For high cholesterol on  atorvastatin.  Gerd well controlled with omeprazole.    Review of Systems  Constitutional: Negative for chills, fatigue and fever.  HENT: Negative for congestion, ear pain and facial swelling.   Respiratory: Positive for shortness of breath. Negative for cough, chest tightness and wheezing.        Mild daily sob but at baseline.  Gastrointestinal: Negative for abdominal pain.  Genitourinary: Negative for dysuria and flank pain.  Musculoskeletal: Negative for back pain, joint swelling, myalgias and neck stiffness.  Skin: Negative for rash.  Neurological: Negative for dizziness and headaches.  Hematological: Negative for adenopathy. Does not bruise/bleed easily.  Psychiatric/Behavioral: Negative for behavioral problems, dysphoric mood and suicidal ideas. The patient is not nervous/anxious.        See hpi.    Past Medical History:  Diagnosis Date  . ANXIETY 07/23/2008  . COLONIC POLYPS, HX OF 03/26/2006   MULTIPLE FRAGMENTS OF TUBULAR ADENOMAS POLYPS  . COPD (chronic obstructive pulmonary disease) ([place])   . Diverticulosis   . Family history of colon cancer    Father   . GERD 07/23/2008  . Hepatitis C   . HYPERLIPIDEMIA 07/23/2008  . HYPERTENSION 07/23/2008  . TENDINITIS, LEFT THUMB 07/15/2010     Social History   Socioeconomic History  . Marital status: Widowed    Spouse name: Not on file  . Number of children: 0  . Years of education: Not on file  . Highest education level:  Not on file  Occupational History    Employer: [employer]  Tobacco Use  . Smoking status: Current Every Day Smoker    Packs/day: 1.50    Years: 35.00    Pack years: 52.50    Types: Cigarettes  . Smokeless tobacco: Never Used  . Tobacco comment: Started smoking at age 22.  Currently smoking 1 1/2 ppd.  Vaping Use  . Vaping Use: Never used  Substance and Sexual Activity  . Alcohol use: No  . Drug use: No  . Sexual activity: Not on file  Other Topics Concern  . Not on file  Social History Narrative  . Not on file   Social Determinants of Health   Financial Resource Strain: Not on file  Food Insecurity: Not on file  Transportation Needs: Not on file  Physical Activity: Not on file  Stress: Not on file  Social Connections: Not on file  Intimate Partner Violence: Not on file    Past Surgical History:  Procedure Laterality Date  . COLONOSCOPY  11/27/2012  . LUMBAR LAMINECTOMY      Family History  Problem Relation Age of Onset  . Colon cancer Father 48  . Hypertension Father   . Diabetes Father   . Heart attack Father   . Colon polyps Neg Hx   . Esophageal cancer Neg Hx   . Rectal cancer Neg Hx   . Stomach cancer Neg Hx  No Known Allergies  Current Outpatient Medications on File Prior to Visit  Medication Sig Dispense Refill  . albuterol (PROVENTIL) (2.5 MG/3ML) 0.083% nebulizer solution Take 3 mLs (2.5 mg total) by nebulization every 6 (six) hours as needed for wheezing or shortness of breath. Dx code: J44.9 75 mL 11  . albuterol (VENTOLIN HFA) 108 (90 Base) MCG/ACT inhaler Inhale 1-2 puffs into the lungs every 6 (six) hours as needed. 18 g 6  . amLODipine (NORVASC) 5 MG tablet Take 1 tablet (5 mg total) by mouth daily. 90 tablet 0  . atorvastatin (LIPITOR) 40 MG tablet Take 1 tablet by mouth once daily 90 tablet 0  . citalopram (CELEXA) 40 MG tablet Take 1 tablet (40 mg total) by mouth daily. 30 tablet 5  . Fluticasone-Umeclidin-Vilant (TRELEGY  ELLIPTA) 100-62.5-25 MCG/INH AEPB Inhale 1 puff into the lungs daily. 60 each 5  . ibuprofen (ADVIL) 200 MG tablet Take 400 mg by mouth every 6 (six) hours as needed.    Marland Kitchen ketoconazole (NIZORAL) 2 % cream Apply 1 application topically daily. 15 g 0  . LORazepam (ATIVAN) 0.5 MG tablet Take 1 tablet (0.5 mg total) by mouth 2 (two) times daily. 60 tablet 0  . losartan (COZAAR) 50 MG tablet Take 1 tablet (50 mg total) by mouth daily. 90 tablet 1  . omeprazole (PRILOSEC) 40 MG capsule Take 1 capsule (40 mg total) by mouth daily. 90 capsule 3  . tamsulosin (FLOMAX) 0.4 MG CAPS capsule Take 1 capsule (0.4 mg total) by mouth daily. 90 capsule 3   No current facility-administered medications on file prior to visit.    BP 127/72   Pulse 89   Temp 98.2 F (36.8 C)   Resp 18   Ht 5\' 9"  (1.753 m)   Wt 208 lb 6.4 oz (94.5 kg)   SpO2 98%   BMI 30.78 kg/m      Objective:   Physical Exam  General Mental Status- Alert. General Appearance- Not in acute distress.   Skin General: Color- Normal Color. Moisture- Normal Moisture.  Neck Carotid Arteries- Normal color. Moisture- Normal Moisture. No carotid bruits. No JVD.  Chest and Lung Exam Auscultation: Breath Sounds:-mild rough breath souds bilaterally with mild shallow respiration. But no labored.  Cardiovascular Auscultation:Rythm- Regular. Murmurs & Other Heart Sounds:Auscultation of the heart reveals- No Murmurs.   Neurologic Cranial Nerve exam:- CN III-XII intact(No nystagmus), symmetric smile. Strength:- 5/5 equal and symmetric strength both upper and lower extremities.      Assessment & Plan:  Your anxiety is well controlled on ativan. Continue. Up to date on uds, contract and states database reviewed. Refill when due.  htn controlled. Continue  losartan 50 mg daily and amlodipine 5 mg daily.  For hyperlipidemia continue with atorvastatin.  For copd continue trelegy.   For gerd continue omeprazole.  Get cmp and lipid  panel today.  Follow up I 3 months or as needed.  Mackie Pai, PA-C

## 2021-02-17 NOTE — Patient Instructions (Addendum)
Your anxiety is well controlled on ativan. Continue. Up to date on uds, contract and states database reviewed. Refill when due.  htn controlled. Continue  losartan 50 mg daily and amlodipine 5 mg daily.  For hyperlipidemia continue with atorvastatin. Dose adjust possible after lab review.  For copd continue trelegy.   For gerd continue omeprazole.  Get cmp and lipid panel today.  Follow up I 3 months or as needed.

## 2021-02-17 NOTE — Addendum Note (Signed)
Addended by: Anabel Halon on: 02/17/2021 08:17 AM   Modules accepted: Orders

## 2021-02-25 ENCOUNTER — Other Ambulatory Visit: Payer: Self-pay | Admitting: Medical

## 2021-02-25 NOTE — Telephone Encounter (Addendum)
Requesting: lorazepam 0.5mg  Contract: 11/08/2020 UDS: 11/08/2020 Last Visit: 02/17/2021  Next Visit: 05/17/2021 Last Refill: 01/26/2021 #60 and 0RF  Please Advise  Rx refill sent to pt pharmacy.  Mackie Pai, PA-C

## 2021-03-16 ENCOUNTER — Ambulatory Visit: Payer: PPO | Admitting: Pulmonary Disease

## 2021-03-16 NOTE — Progress Notes (Incomplete)
Synopsis: Follow up for COPD  Subjective:   PATIENT ID: Trevor Peri GENDER: male DOB: [birth year], MRN: [id number]  HPI  No chief complaint on file.   Trevor Mack is a 65 year old male, daily smoker with history of COPD who returns to pulmonary clinic for follow up of COPD.  He reports his breathing has been stable since last visit. He was treated for acute exacerbation of his COPD at the last visit with prednisone and azithromycin. He continues to use trelegy ellipta daily and as needed albuterol 2-3 times per day. He continues to experience exertional dyspnea. Denies increased sputum production at this time.    He was not able to complete a nocturnal oximetry test due to miscommunications between him and the DME company.   Past Medical History:  Diagnosis Date  . ANXIETY 07/23/2008  . COLONIC POLYPS, HX OF 03/26/2006   MULTIPLE FRAGMENTS OF TUBULAR ADENOMAS POLYPS  . COPD (chronic obstructive pulmonary disease) ([place])   . Diverticulosis   . Family history of colon cancer    Father   . GERD 07/23/2008  . Hepatitis C   . HYPERLIPIDEMIA 07/23/2008  . HYPERTENSION 07/23/2008  . TENDINITIS, LEFT THUMB 07/15/2010     Family History  Problem Relation Age of Onset  . Colon cancer Father 66  . Hypertension Father   . Diabetes Father   . Heart attack Father   . Colon polyps Neg Hx   . Esophageal cancer Neg Hx   . Rectal cancer Neg Hx   . Stomach cancer Neg Hx      Social History   Socioeconomic History  . Marital status: Widowed    Spouse name: Not on file  . Number of children: 0  . Years of education: Not on file  . Highest education level: Not on file  Occupational History    Employer: [employer]  Tobacco Use  . Smoking status: Current Every Day Smoker    Packs/day: 1.50    Years: 35.00    Pack years: 52.50    Types: Cigarettes  . Smokeless tobacco: Never Used  . Tobacco comment: Started smoking at age 46.  Currently smoking 1 1/2 ppd.  Vaping Use   . Vaping Use: Never used  Substance and Sexual Activity  . Alcohol use: No  . Drug use: No  . Sexual activity: Not on file  Other Topics Concern  . Not on file  Social History Narrative  . Not on file   Social Determinants of Health   Financial Resource Strain: Not on file  Food Insecurity: Not on file  Transportation Needs: Not on file  Physical Activity: Not on file  Stress: Not on file  Social Connections: Not on file  Intimate Partner Violence: Not on file     No Known Allergies   Outpatient Medications Prior to Visit  Medication Sig Dispense Refill  . albuterol (PROVENTIL) (2.5 MG/3ML) 0.083% nebulizer solution Take 3 mLs (2.5 mg total) by nebulization every 6 (six) hours as needed for wheezing or shortness of breath. Dx code: J44.9 75 mL 11  . albuterol (VENTOLIN HFA) 108 (90 Base) MCG/ACT inhaler Inhale 1-2 puffs into the lungs every 6 (six) hours as needed. 18 Mack 6  . amLODipine (NORVASC) 5 MG tablet Take 1 tablet (5 mg total) by mouth daily. 90 tablet 0  . atorvastatin (LIPITOR) 40 MG tablet Take 1 tablet by mouth once daily 90 tablet 0  . citalopram (CELEXA) 40 MG tablet  Take 1 tablet (40 mg total) by mouth daily. 30 tablet 5  . Fluticasone-Umeclidin-Vilant (TRELEGY ELLIPTA) 100-62.5-25 MCG/INH AEPB Inhale 1 puff into the lungs daily. 60 each 5  . ibuprofen (ADVIL) 200 MG tablet Take 400 mg by mouth every 6 (six) hours as needed.    Marland Kitchen ketoconazole (NIZORAL) 2 % cream Apply 1 application topically daily. 15 Mack 0  . LORazepam (ATIVAN) 0.5 MG tablet Take 1 tablet by mouth twice daily 60 tablet 0  . losartan (COZAAR) 50 MG tablet Take 1 tablet (50 mg total) by mouth daily. 90 tablet 1  . omeprazole (PRILOSEC) 40 MG capsule Take 1 capsule (40 mg total) by mouth daily. 90 capsule 3  . tamsulosin (FLOMAX) 0.4 MG CAPS capsule Take 1 capsule (0.4 mg total) by mouth daily. 90 capsule 3   No facility-administered medications prior to visit.    Review of Systems   Constitutional: Negative for chills, fever and weight loss.  HENT: Negative for congestion, sinus pain and sore throat.   Eyes: Negative.   Respiratory: Positive for cough, sputum production and shortness of breath. Negative for hemoptysis and wheezing.   Cardiovascular: Positive for leg swelling. Negative for chest pain.  Gastrointestinal: Negative for abdominal pain, heartburn and nausea.  Genitourinary: Negative.   Musculoskeletal: Negative.   Neurological: Negative for dizziness, weakness and headaches.  Endo/Heme/Allergies: Negative.   Psychiatric/Behavioral: Negative.     Objective:   There were no vitals filed for this visit.   Physical Exam Constitutional:      General: He is not in acute distress.    Appearance: He is obese. He is not ill-appearing.  HENT:     Head: Normocephalic and atraumatic.     Nose: Nose normal.     Mouth/Throat:     Mouth: Mucous membranes are moist.     Pharynx: Oropharynx is clear.  Eyes:     General: No scleral icterus.    Conjunctiva/sclera: Conjunctivae normal.  Cardiovascular:     Pulses: Normal pulses.     Heart sounds: Normal heart sounds. No murmur heard.   Pulmonary:     Effort: Pulmonary effort is normal.     Breath sounds: Decreased breath sounds (bilateral bases) and rhonchi (left base) present. No wheezing or rales.  Abdominal:     General: Bowel sounds are normal.     Palpations: Abdomen is soft.  Musculoskeletal:     Cervical back: Neck supple.     Right lower leg: Edema present.     Left lower leg: Edema present.  Skin:    Capillary Refill: Capillary refill takes less than 2 seconds.  Neurological:     General: No focal deficit present.     Mental Status: He is alert.     Gait: Gait normal.  Psychiatric:        Mood and Affect: Mood normal.        Behavior: Behavior normal.        Thought Content: Thought content normal.        Judgment: Judgment normal.    CBC    Component Value Date/Time   WBC 11.3 (H)  11/08/2020 1131   RBC 5.52 11/08/2020 1131   HGB 15.5 11/08/2020 1131   HCT 46.5 11/08/2020 1131   PLT 188.0 11/08/2020 1131   MCV 84.1 11/08/2020 1131   MCHC 33.3 11/08/2020 1131   RDW 15.3 11/08/2020 1131   LYMPHSABS 2.2 11/08/2020 1131   MONOABS 0.6 11/08/2020 1131   EOSABS 0.1 11/08/2020  1131   BASOSABS 0.1 11/08/2020 1131   BMP Latest Ref Rng & Units 02/17/2021 11/08/2020 05/26/2020  Glucose 70 - 99 mg/dL 93 76 93  BUN 6 - 23 mg/dL 12 13 19   Creatinine 0.40 - 1.50 mg/dL 0.99 1.06 1.02  Sodium 135 - 145 mEq/L 139 138 137  Potassium 3.5 - 5.1 mEq/L 3.9 3.9 4.1  Chloride 96 - 112 mEq/L 101 100 101  CO2 19 - 32 mEq/L 30 29 29   Calcium 8.4 - 10.5 mg/dL 8.8 8.8 8.8    Chest imaging: CT Chest Lung Cancer Screening 01/19/21 Mediastinum/Nodes: No mediastinal lymphadenopathy. No evidence for gross hilar lymphadenopathy although assessment is limited by the lack of intravenous contrast on today's study. Tiny hiatal hernia. The esophagus has normal imaging features. There is no axillary lymphadenopathy.  Lungs/Pleura: Centrilobular emphsyema noted. Scattered solid and non solid bilateral pulmonary nodules are identified. No overtly suspicious nodule or mass. No focal airspace consolidation. No pleural effusion.  CXR 10/2018 There is no edema or consolidation. The heart size and pulmonary vascularity are normal. No adenopathy. No evident bone lesions.  PFT: PFT Results Latest Ref Rng & Units 04/12/2017  FVC-Pre L 2.43  FVC-Predicted Pre % 52  FVC-Post L 2.85  FVC-Predicted Post % 61  Pre FEV1/FVC % % 62  Post FEV1/FCV % % 61  FEV1-Pre L 1.49  FEV1-Predicted Pre % 42  FEV1-Post L 1.75  DLCO uncorrected ml/min/mmHg 12.29  DLCO UNC% % 39  DLCO corrected ml/min/mmHg 11.82  DLCO COR %Predicted % 38  DLVA Predicted % 61  TLC L 7.02  TLC % Predicted % 103  RV % Predicted % 214     Assessment & Plan:   No diagnosis found.  Discussion: Antero Derosia is a 65 year old male,  daily smoker with history of COPD who returns to pulmonary clinic for follow up.  His breathing has been stable since the last visit and treatment for acute excarebation of COPD. He remains on trelegy ellipta and as needed albuterol.   Given his history of COPD and concern for sleep apnea, we will perform nocturnal oximetry to determine if he would benefit from nocturnal oxygen therapy.   Based on his smoking history, he does qualify for lung cancer screening. Referral made to our lung cancer screening program.   He is not interested in smoking cessation aids at this time.  Patient to follow up in 4 months.  Freda Jackson, MD [facility] Office: [phone]   See Amion for Pager Details     Current Outpatient Medications:  .  albuterol (PROVENTIL) (2.5 MG/3ML) 0.083% nebulizer solution, Take 3 mLs (2.5 mg total) by nebulization every 6 (six) hours as needed for wheezing or shortness of breath. Dx code: J44.9, Disp: 75 mL, Rfl: 11 .  albuterol (VENTOLIN HFA) 108 (90 Base) MCG/ACT inhaler, Inhale 1-2 puffs into the lungs every 6 (six) hours as needed., Disp: 18 Mack, Rfl: 6 .  amLODipine (NORVASC) 5 MG tablet, Take 1 tablet (5 mg total) by mouth daily., Disp: 90 tablet, Rfl: 0 .  atorvastatin (LIPITOR) 40 MG tablet, Take 1 tablet by mouth once daily, Disp: 90 tablet, Rfl: 0 .  citalopram (CELEXA) 40 MG tablet, Take 1 tablet (40 mg total) by mouth daily., Disp: 30 tablet, Rfl: 5 .  Fluticasone-Umeclidin-Vilant (TRELEGY ELLIPTA) 100-62.5-25 MCG/INH AEPB, Inhale 1 puff into the lungs daily., Disp: 60 each, Rfl: 5 .  ibuprofen (ADVIL) 200 MG tablet, Take 400 mg by mouth  every 6 (six) hours as needed., Disp: , Rfl:  .  ketoconazole (NIZORAL) 2 % cream, Apply 1 application topically daily., Disp: 15 Mack, Rfl: 0 .  LORazepam (ATIVAN) 0.5 MG tablet, Take 1 tablet by mouth twice daily, Disp: 60 tablet, Rfl: 0 .  losartan (COZAAR) 50 MG tablet, Take 1 tablet (50 mg total)  by mouth daily., Disp: 90 tablet, Rfl: 1 .  omeprazole (PRILOSEC) 40 MG capsule, Take 1 capsule (40 mg total) by mouth daily., Disp: 90 capsule, Rfl: 3 .  tamsulosin (FLOMAX) 0.4 MG CAPS capsule, Take 1 capsule (0.4 mg total) by mouth daily., Disp: 90 capsule, Rfl: 3

## 2021-03-28 ENCOUNTER — Other Ambulatory Visit: Payer: Self-pay | Admitting: Medical

## 2021-03-28 NOTE — Telephone Encounter (Signed)
Requesting: lorazepam 0.5mg   Contract: 11/08/2020 UDS: 11/08/2020 Last Visit: 02/17/2021 Next Visit: 05/17/2021 Last Refill: 02/25/2021 #60 and 0RF  Please Advise

## 2021-03-28 NOTE — Telephone Encounter (Signed)
Refilled pt ativan. Due for refill. Up to date on uds and contract.

## 2021-03-29 ENCOUNTER — Telehealth: Payer: Self-pay | Admitting: Medical

## 2021-03-29 NOTE — Telephone Encounter (Signed)
Patient states he would like a call back in reference to medication refill

## 2021-03-30 NOTE — Telephone Encounter (Signed)
Called pt and lvm to return call.  

## 2021-03-31 NOTE — Telephone Encounter (Signed)
Spoke with pt

## 2021-04-14 ENCOUNTER — Other Ambulatory Visit: Payer: Self-pay | Admitting: Medical

## 2021-04-20 ENCOUNTER — Other Ambulatory Visit: Payer: Self-pay

## 2021-04-20 ENCOUNTER — Telehealth: Payer: Self-pay

## 2021-04-20 ENCOUNTER — Ambulatory Visit (INDEPENDENT_AMBULATORY_CARE_PROVIDER_SITE_OTHER): Payer: PPO | Admitting: Pulmonary Disease

## 2021-04-20 ENCOUNTER — Encounter: Payer: Self-pay | Admitting: Pulmonary Disease

## 2021-04-20 VITALS — BP 130/78 | HR 101 | Temp 97.7°F | Ht 69.0 in | Wt 209.0 lb

## 2021-04-20 DIAGNOSIS — J441 Chronic obstructive pulmonary disease with (acute) exacerbation: Secondary | ICD-10-CM | POA: Diagnosis not present

## 2021-04-20 MED ORDER — AZITHROMYCIN 250 MG PO TABS: ORAL_TABLET | ORAL | 0 refills | Status: DC

## 2021-04-20 MED ORDER — PREDNISONE 10 MG PO TABS: 10.0000 mg | ORAL_TABLET | Freq: Every day | ORAL | 0 refills | Status: DC

## 2021-04-20 NOTE — Telephone Encounter (Signed)
Called [facility] for ONO to be faxed to the office. Nothing further needed.

## 2021-04-20 NOTE — Progress Notes (Signed)
Synopsis: Follow up for COPD  Subjective:   PATIENT ID: Trevor Mack GENDER: male DOB: [birth year], MRN: [id number]  HPI  Chief Complaint  Patient presents with  . Follow-up    Sob with exertion has been worse for about a month now.     Trevor Mack is a 65 year old male, daily smoker with history of COPD who returns to pulmonary clinic for follow up of COPD.  He reports his breathing has become more labored since last visit over recent days. He has had an increase in sputum production and wheezing.   He was treated for COPD exacerbation August 2021 with prednisone and azithromycin.   He has completed an overnight oximetry test at [facility] but we do not have the report yet.   He continues to smoke 1.5 packs per day, down from 2 packs per day recently. He again reports he does not want to try patches, wellbutrin or chantix as he has tried these in the past.   He has dropped off disability paperwork.   Past Medical History:  Diagnosis Date  . ANXIETY 07/23/2008  . COLONIC POLYPS, HX OF 03/26/2006   MULTIPLE FRAGMENTS OF TUBULAR ADENOMAS POLYPS  . COPD (chronic obstructive pulmonary disease) ([place])   . Diverticulosis   . Family history of colon cancer    Father   . GERD 07/23/2008  . Hepatitis C   . HYPERLIPIDEMIA 07/23/2008  . HYPERTENSION 07/23/2008  . TENDINITIS, LEFT THUMB 07/15/2010     Family History  Problem Relation Age of Onset  . Colon cancer Father 87  . Hypertension Father   . Diabetes Father   . Heart attack Father   . Colon polyps Neg Hx   . Esophageal cancer Neg Hx   . Rectal cancer Neg Hx   . Stomach cancer Neg Hx      Social History   Socioeconomic History  . Marital status: Widowed    Spouse name: Not on file  . Number of children: 0  . Years of education: Not on file  . Highest education level: Not on file  Occupational History    Employer: [employer]  Tobacco Use  . Smoking status: Current Every Day Smoker    Packs/day: 1.50     Years: 35.00    Pack years: 52.50    Types: Cigarettes  . Smokeless tobacco: Never Used  . Tobacco comment: Started smoking at age 23.  Currently smoking 1 1/2 ppd.  Vaping Use  . Vaping Use: Never used  Substance and Sexual Activity  . Alcohol use: No  . Drug use: No  . Sexual activity: Not on file  Other Topics Concern  . Not on file  Social History Narrative  . Not on file   Social Determinants of Health   Financial Resource Strain: Not on file  Food Insecurity: Not on file  Transportation Needs: Not on file  Physical Activity: Not on file  Stress: Not on file  Social Connections: Not on file  Intimate Partner Violence: Not on file     No Known Allergies   Outpatient Medications Prior to Visit  Medication Sig Dispense Refill  . albuterol (PROVENTIL) (2.5 MG/3ML) 0.083% nebulizer solution Take 3 mLs (2.5 mg total) by nebulization every 6 (six) hours as needed for wheezing or shortness of breath. Dx code: J44.9 75 mL 11  . albuterol (VENTOLIN HFA) 108 (90 Base) MCG/ACT inhaler Inhale 1-2 puffs into the lungs every 6 (six) hours as needed.  18 g 6  . amLODipine (NORVASC) 5 MG tablet Take 1 tablet (5 mg total) by mouth daily. 90 tablet 0  . atorvastatin (LIPITOR) 40 MG tablet Take 1 tablet by mouth once daily 90 tablet 0  . citalopram (CELEXA) 40 MG tablet Take 1 tablet by mouth once daily 30 tablet 0  . Fluticasone-Umeclidin-Vilant (TRELEGY ELLIPTA) 100-62.5-25 MCG/INH AEPB Inhale 1 puff into the lungs daily. 60 each 5  . ibuprofen (ADVIL) 200 MG tablet Take 400 mg by mouth every 6 (six) hours as needed.    Marland Kitchen ketoconazole (NIZORAL) 2 % cream Apply 1 application topically daily. 15 g 0  . LORazepam (ATIVAN) 0.5 MG tablet Take 1 tablet by mouth twice daily 60 tablet 0  . losartan (COZAAR) 50 MG tablet Take 1 tablet (50 mg total) by mouth daily. 90 tablet 1  . omeprazole (PRILOSEC) 40 MG capsule Take 1 capsule (40 mg total) by mouth daily. 90 capsule 3  . tamsulosin (FLOMAX)  0.4 MG CAPS capsule Take 1 capsule (0.4 mg total) by mouth daily. 90 capsule 3   No facility-administered medications prior to visit.    Review of Systems  Constitutional: Negative for chills, fever and weight loss.  HENT: Negative for congestion, sinus pain and sore throat.   Eyes: Negative.   Respiratory: Positive for cough, sputum production and shortness of breath. Negative for hemoptysis and wheezing.   Cardiovascular: Positive for leg swelling. Negative for chest pain.  Gastrointestinal: Negative for abdominal pain, heartburn and nausea.  Genitourinary: Negative.   Musculoskeletal: Negative.   Neurological: Negative for dizziness, weakness and headaches.  Endo/Heme/Allergies: Negative.   Psychiatric/Behavioral: Negative.     Objective:   Vitals:   04/20/21 1036  BP: 130/78  Pulse: (!) 101  Temp: 97.7 F (36.5 C)  SpO2: 97%  Weight: 209 lb (94.8 kg)  Height: 5\' 9"  (1.753 m)     Physical Exam Constitutional:      General: He is not in acute distress.    Appearance: He is obese. He is not ill-appearing.  HENT:     Head: Normocephalic and atraumatic.     Nose: Nose normal.     Mouth/Throat:     Mouth: Mucous membranes are moist.     Pharynx: Oropharynx is clear.  Eyes:     General: No scleral icterus.    Conjunctiva/sclera: Conjunctivae normal.  Cardiovascular:     Pulses: Normal pulses.     Heart sounds: Normal heart sounds. No murmur heard.   Pulmonary:     Effort: Accessory muscle usage and prolonged expiration present.     Breath sounds: Decreased air movement present. Decreased breath sounds (bilateral bases) present. No wheezing, rhonchi or rales.  Abdominal:     General: Bowel sounds are normal.     Palpations: Abdomen is soft.  Musculoskeletal:     Cervical back: Neck supple.     Right lower leg: No edema.     Left lower leg: No edema.  Skin:    Capillary Refill: Capillary refill takes less than 2 seconds.  Neurological:     General: No focal  deficit present.     Mental Status: He is alert.     Gait: Gait normal.  Psychiatric:        Mood and Affect: Mood normal.        Behavior: Behavior normal.        Thought Content: Thought content normal.        Judgment: Judgment normal.  CBC    Component Value Date/Time   WBC 11.3 (H) 11/08/2020 1131   RBC 5.52 11/08/2020 1131   HGB 15.5 11/08/2020 1131   HCT 46.5 11/08/2020 1131   PLT 188.0 11/08/2020 1131   MCV 84.1 11/08/2020 1131   MCHC 33.3 11/08/2020 1131   RDW 15.3 11/08/2020 1131   LYMPHSABS 2.2 11/08/2020 1131   MONOABS 0.6 11/08/2020 1131   EOSABS 0.1 11/08/2020 1131   BASOSABS 0.1 11/08/2020 1131   BMP Latest Ref Rng & Units 02/17/2021 11/08/2020 05/26/2020  Glucose 70 - 99 mg/dL 93 76 93  BUN 6 - 23 mg/dL 12 13 19   Creatinine 0.40 - 1.50 mg/dL 0.99 1.06 1.02  Sodium 135 - 145 mEq/L 139 138 137  Potassium 3.5 - 5.1 mEq/L 3.9 3.9 4.1  Chloride 96 - 112 mEq/L 101 100 101  CO2 19 - 32 mEq/L 30 29 29   Calcium 8.4 - 10.5 mg/dL 8.8 8.8 8.8     Chest imaging: CXR 10/2018 There is no edema or consolidation. The heart size and pulmonary vascularity are normal. No adenopathy. No evident bone lesions.  PFT: PFT Results Latest Ref Rng & Units 04/12/2017  FVC-Pre L 2.43  FVC-Predicted Pre % 52  FVC-Post L 2.85  FVC-Predicted Post % 61  Pre FEV1/FVC % % 62  Post FEV1/FCV % % 61  FEV1-Pre L 1.49  FEV1-Predicted Pre % 42  FEV1-Post L 1.75  DLCO uncorrected ml/min/mmHg 12.29  DLCO UNC% % 39  DLCO corrected ml/min/mmHg 11.82  DLCO COR %Predicted % 38  DLVA Predicted % 61  TLC L 7.02  TLC % Predicted % 103  RV % Predicted % 214     Assessment & Plan:   Chronic obstructive lung disease with acute on chronic bronchitis and exacerbation gold stage C - Plan: predniSONE (DELTASONE) 10 MG tablet, Pulmonary Function Test, azithromycin (ZITHROMAX) 250 MG tablet  Discussion: Trevor Mack is a 65 year old male, daily smoker with history of COPD who returns to  pulmonary clinic for follow up.  He is having an acute exacerbation of his COPD. We will treat him with an extended steroid taper and course of azithromycin.   Strongly encouraged him to reduced his smoking further and to stop all together. He continues to smoke 1.5 packs per day. I encouraged him to use nicotine patches in order to cut back further but he continues to express reluctance in smoking cessation methods.   On simple walk today he completed 1 lap without oxygen desaturations but stopped the walk due to feeling dizzy/lightheaded.   He is enrolled in our lung cancer screening program. Last scan was 01/19/21, lung-rads 2.  Will obtain the overnight oximetry results from [facility].   Patient to follow up in 3 months.  Freda Jackson, MD [facility] Office: [phone]     Current Outpatient Medications:  .  albuterol (PROVENTIL) (2.5 MG/3ML) 0.083% nebulizer solution, Take 3 mLs (2.5 mg total) by nebulization every 6 (six) hours as needed for wheezing or shortness of breath. Dx code: J44.9, Disp: 75 mL, Rfl: 11 .  albuterol (VENTOLIN HFA) 108 (90 Base) MCG/ACT inhaler, Inhale 1-2 puffs into the lungs every 6 (six) hours as needed., Disp: 18 g, Rfl: 6 .  amLODipine (NORVASC) 5 MG tablet, Take 1 tablet (5 mg total) by mouth daily., Disp: 90 tablet, Rfl: 0 .  atorvastatin (LIPITOR) 40 MG tablet, Take 1 tablet by mouth once daily, Disp: 90 tablet, Rfl: 0 .  azithromycin (  ZITHROMAX) 250 MG tablet, Take as directed, Disp: 6 tablet, Rfl: 0 .  citalopram (CELEXA) 40 MG tablet, Take 1 tablet by mouth once daily, Disp: 30 tablet, Rfl: 0 .  Fluticasone-Umeclidin-Vilant (TRELEGY ELLIPTA) 100-62.5-25 MCG/INH AEPB, Inhale 1 puff into the lungs daily., Disp: 60 each, Rfl: 5 .  ibuprofen (ADVIL) 200 MG tablet, Take 400 mg by mouth every 6 (six) hours as needed., Disp: , Rfl:  .  ketoconazole (NIZORAL) 2 % cream, Apply 1 application topically daily., Disp: 15 g, Rfl: 0 .   LORazepam (ATIVAN) 0.5 MG tablet, Take 1 tablet by mouth twice daily, Disp: 60 tablet, Rfl: 0 .  losartan (COZAAR) 50 MG tablet, Take 1 tablet (50 mg total) by mouth daily., Disp: 90 tablet, Rfl: 1 .  omeprazole (PRILOSEC) 40 MG capsule, Take 1 capsule (40 mg total) by mouth daily., Disp: 90 capsule, Rfl: 3 .  predniSONE (DELTASONE) 10 MG tablet, Take 1 tablet (10 mg total) by mouth daily with breakfast., Disp: 30 tablet, Rfl: 0 .  tamsulosin (FLOMAX) 0.4 MG CAPS capsule, Take 1 capsule (0.4 mg total) by mouth daily., Disp: 90 capsule, Rfl: 3

## 2021-04-20 NOTE — Patient Instructions (Addendum)
Prednisone taper: 40mg  x 3 days 30mg  x 3 days 20mg  x 3 days 10mg  x 3 days  Take Azithromycin 2 tablets on day 1 then 1 tablet daily until gone.   Recommend cutting back on smoking with nicotine patches. You need to quit smoking all together or your breathing will continue to get worse.  We will schedule you for pulmonary function tests in the near future.

## 2021-04-22 ENCOUNTER — Telehealth: Payer: Self-pay | Admitting: Pulmonary Disease

## 2021-04-25 ENCOUNTER — Other Ambulatory Visit: Payer: Self-pay | Admitting: Medical

## 2021-04-28 ENCOUNTER — Other Ambulatory Visit: Payer: Self-pay | Admitting: Medical

## 2021-04-28 NOTE — Telephone Encounter (Addendum)
Requesting: ativan Contract:12/23/2020 UDS:11/08/2020 Last Visit:02/18/2020 Next Visit:05/17/2020 Last Refill:03/28/2020  Please Advise  Rx refill sent to pt pharmacy.

## 2021-05-05 DIAGNOSIS — Z0289 Encounter for other administrative examinations: Secondary | ICD-10-CM

## 2021-05-05 NOTE — Telephone Encounter (Signed)
Rec'd forms back - pt paid form fee - faxed to [facility] at [phone] -pr

## 2021-05-13 ENCOUNTER — Other Ambulatory Visit: Payer: Self-pay | Admitting: Medical

## 2021-05-16 ENCOUNTER — Other Ambulatory Visit: Payer: Self-pay

## 2021-05-17 ENCOUNTER — Encounter: Payer: Self-pay | Admitting: Medical

## 2021-05-17 ENCOUNTER — Ambulatory Visit (INDEPENDENT_AMBULATORY_CARE_PROVIDER_SITE_OTHER): Payer: PPO | Admitting: Medical

## 2021-05-17 VITALS — BP 128/64 | HR 89 | Temp 98.1°F | Resp 20 | Ht 69.0 in | Wt 211.8 lb

## 2021-05-17 DIAGNOSIS — E785 Hyperlipidemia, unspecified: Secondary | ICD-10-CM | POA: Diagnosis not present

## 2021-05-17 DIAGNOSIS — J441 Chronic obstructive pulmonary disease with (acute) exacerbation: Secondary | ICD-10-CM

## 2021-05-17 DIAGNOSIS — I10 Essential (primary) hypertension: Secondary | ICD-10-CM | POA: Diagnosis not present

## 2021-05-17 DIAGNOSIS — Z7185 Encounter for immunization safety counseling: Secondary | ICD-10-CM | POA: Diagnosis not present

## 2021-05-17 DIAGNOSIS — F419 Anxiety disorder, unspecified: Secondary | ICD-10-CM | POA: Diagnosis not present

## 2021-05-17 NOTE — Patient Instructions (Addendum)
Anxiety controlled. Up to date on contract and uds. Continue ativan. Will refill when you are due.   Depression controlled/improved with celexa. Continue celexa.  Hyperlipidemia. Controlled except low hdl. Continue atorvastatin 40 mg daily.  Htn well controlled. Continue losoartan 50 mg daily.  For copd continue trelegy continue to follow up with pulmonlogist.  Vaccine counseling for covid in light of age and risk factors. I do think good idea for you to get booster.  Follow up 4 months or as needed

## 2021-05-17 NOTE — Progress Notes (Signed)
Subjective:    Patient ID: Trevor Mack, male    DOB: 11/03/1956, 65 y.o.   MRN: [id number]  HPI  Pt anxiety is controlled with ativan. uds and contract up to date. [facility] drug data base reviewed. Pt not  due for refill of ativan yet. Will be due May 29, 2021.  Some mild depression. He is celexa and states mood is not bad.  Hx of htn. Recently very well with  losartan 50 mg daily and amlodipine 5 mg daily.  For copd on trelegy. Followed by pulmonologist.  "Chronic obstructive lung disease with acute on chronic bronchitis and exacerbation gold stage C - Plan: predniSONE (DELTASONE) 10 MG tablet, Pulmonary Function Test, azithromycin (ZITHROMAX) 250 MG tablet  Discussion: Trevor Mack is a 65 year old male, daily smoker with history of COPD who returns to pulmonary clinic for follow up.  He is having an acute exacerbation of his COPD. We will treat him with an extended steroid taper and course of azithromycin.   Strongly encouraged him to reduced his smoking further and to stop all together. He continues to smoke 1.5 packs per day. I encouraged him to use nicotine patches in order to cut back further but he continues to express reluctance in smoking cessation methods.   On simple walk today he completed 1 lap without oxygen desaturations but stopped the walk due to feeling dizzy/lightheaded.   He is enrolled in our lung cancer screening program. Last scan was 01/19/21, lung-rads 2.  Will obtain the overnight oximetry results from [facility]."   Pt continue to smoke. He states that he can't quit.He is not motivated enough per his report.  For high cholesterol on  atorvastatin.  Gerd well controlled with omeprazole.  Pt has not had covid vaccine initial 2. He has not got booster.    Review of Systems  Constitutional: Negative for chills, diaphoresis and fatigue.  Respiratory: Negative for cough, chest tightness, shortness of breath and wheezing.   Cardiovascular:  Negative for chest pain and palpitations.  Gastrointestinal: Negative for abdominal pain.  Genitourinary: Negative for dysuria and frequency.  Musculoskeletal: Negative for back pain, myalgias and neck stiffness.  Skin: Negative for rash.  Neurological: Negative for dizziness, speech difficulty, weakness, numbness and headaches.  Hematological: Negative for adenopathy. Does not bruise/bleed easily.  Psychiatric/Behavioral: Negative for behavioral problems and confusion.    Past Medical History:  Diagnosis Date  . ANXIETY 07/23/2008  . COLONIC POLYPS, HX OF 03/26/2006   MULTIPLE FRAGMENTS OF TUBULAR ADENOMAS POLYPS  . COPD (chronic obstructive pulmonary disease) ([facility])   . Diverticulosis   . Family history of colon cancer    Father   . GERD 07/23/2008  . Hepatitis C   . HYPERLIPIDEMIA 07/23/2008  . HYPERTENSION 07/23/2008  . TENDINITIS, LEFT THUMB 07/15/2010     Social History   Socioeconomic History  . Marital status: Widowed    Spouse name: Not on file  . Number of children: 0  . Years of education: Not on file  . Highest education level: Not on file  Occupational History    Employer: [employer]  Tobacco Use  . Smoking status: Current Every Day Smoker    Packs/day: 1.50    Years: 35.00    Pack years: 52.50    Types: Cigarettes  . Smokeless tobacco: Never Used  . Tobacco comment: Started smoking at age 41.  Currently smoking 1 1/2 ppd.  Vaping Use  . Vaping Use: Never used  Substance and  Sexual Activity  . Alcohol use: No  . Drug use: No  . Sexual activity: Not on file  Other Topics Concern  . Not on file  Social History Narrative  . Not on file   Social Determinants of Health   Financial Resource Strain: Not on file  Food Insecurity: Not on file  Transportation Needs: Not on file  Physical Activity: Not on file  Stress: Not on file  Social Connections: Not on file  Intimate Partner Violence: Not on file    Past Surgical History:  Procedure  Laterality Date  . COLONOSCOPY  11/27/2012  . LUMBAR LAMINECTOMY      Family History  Problem Relation Age of Onset  . Colon cancer Father 43  . Hypertension Father   . Diabetes Father   . Heart attack Father   . Colon polyps Neg Hx   . Esophageal cancer Neg Hx   . Rectal cancer Neg Hx   . Stomach cancer Neg Hx     No Known Allergies  Current Outpatient Medications on File Prior to Visit  Medication Sig Dispense Refill  . albuterol (PROVENTIL) (2.5 MG/3ML) 0.083% nebulizer solution Take 3 mLs (2.5 mg total) by nebulization every 6 (six) hours as needed for wheezing or shortness of breath. Dx code: J44.9 75 mL 11  . albuterol (VENTOLIN HFA) 108 (90 Base) MCG/ACT inhaler Inhale 1-2 puffs into the lungs every 6 (six) hours as needed. 18 g 6  . amLODipine (NORVASC) 5 MG tablet Take 1 tablet (5 mg total) by mouth daily. 90 tablet 0  . atorvastatin (LIPITOR) 40 MG tablet Take 1 tablet by mouth once daily 90 tablet 0  . azithromycin (ZITHROMAX) 250 MG tablet Take as directed 6 tablet 0  . citalopram (CELEXA) 40 MG tablet Take 1 tablet by mouth once daily 30 tablet 0  . Fluticasone-Umeclidin-Vilant (TRELEGY ELLIPTA) 100-62.5-25 MCG/INH AEPB Inhale 1 puff into the lungs daily. 60 each 5  . ibuprofen (ADVIL) 200 MG tablet Take 400 mg by mouth every 6 (six) hours as needed.    Marland Kitchen ketoconazole (NIZORAL) 2 % cream Apply 1 application topically daily. 15 g 0  . LORazepam (ATIVAN) 0.5 MG tablet Take 1 tablet by mouth twice daily 60 tablet 0  . losartan (COZAAR) 50 MG tablet Take 1 tablet (50 mg total) by mouth daily. 90 tablet 1  . omeprazole (PRILOSEC) 40 MG capsule Take 1 capsule (40 mg total) by mouth daily. 90 capsule 3  . predniSONE (DELTASONE) 10 MG tablet Take 1 tablet (10 mg total) by mouth daily with breakfast. 30 tablet 0  . tamsulosin (FLOMAX) 0.4 MG CAPS capsule Take 1 capsule (0.4 mg total) by mouth daily. 90 capsule 3   No current facility-administered medications on file prior to  visit.    BP 128/64   Pulse (!) 110   Temp 98.1 F (36.7 C)   Resp 20   Ht 5\' 9"  (1.753 m)   Wt 211 lb 12.8 oz (96.1 kg)   SpO2 100%   BMI 31.28 kg/m       Objective:   Physical Exam  General Mental Status- Alert. General Appearance- Not in acute distress.   Skin General: Color- Normal Color. Moisture- Normal Moisture.  Neck Carotid Arteries- Normal color. Moisture- Normal Moisture. No carotid bruits. No JVD.  Chest and Lung Exam Auscultation: Breath Sounds:-Normal.  Cardiovascular Auscultation:Rythm- Regular. Murmurs & Other Heart Sounds:Auscultation of the heart reveals- No Murmurs.  Abdomen Inspection:-Inspeection Normal. Palpation/Percussion:Note:No mass. Palpation and  Percussion of the abdomen reveal- Non Tender, Non Distended + BS, no rebound or guarding.   Neurologic Cranial Nerve exam:- CN III-XII intact(No nystagmus), symmetric smile. Strength:- 5/5 equal and symmetric strength both upper and lower extremities.       Assessment & Plan:  Anxiety controlled. Up to date on contract and uds. Continue ativan. Will refill when you are due.   Depression controlled/improved with celexa. Continue celexa.  Hyperlipidemia. Controlled except low hdl. Continue atorvastatin 40 mg daily.  Htn well controlled. Continue losoartan 50 mg daily.  For copd continue trelegy continue to follow up with pulmonlogist.  Vaccine counseling for covid in light of age and risk factors. I do think good idea for you to get booster.  Follow up 4 months or as needed

## 2021-05-20 ENCOUNTER — Other Ambulatory Visit: Payer: Self-pay | Admitting: Medical

## 2021-06-06 ENCOUNTER — Other Ambulatory Visit: Payer: Self-pay | Admitting: Medical

## 2021-06-14 ENCOUNTER — Other Ambulatory Visit: Payer: Self-pay | Admitting: Medical

## 2021-06-28 ENCOUNTER — Other Ambulatory Visit: Payer: Self-pay | Admitting: Medical

## 2021-06-28 NOTE — Telephone Encounter (Addendum)
Requesting: lorazepam 0.5mg  Contract: 11/08/2020 UDS: 11/08/2020 Last Visit: 05/17/2021 Next Visit: 09/19/2021 Last Refill: 04/28/2021 #60 and 0RF  Please Advise  Indication anxiety. Former pt of Dr. Verline Lema who had written him benzo in past. Then he swtiched to me. On lower dose benzo now than he was in past. Anxiety controlled.  Refilled me today.

## 2021-07-14 ENCOUNTER — Other Ambulatory Visit: Payer: Self-pay | Admitting: Medical

## 2021-07-16 ENCOUNTER — Other Ambulatory Visit: Payer: Self-pay | Admitting: Pulmonary Disease

## 2021-07-25 ENCOUNTER — Other Ambulatory Visit: Payer: Self-pay | Admitting: Medical

## 2021-07-29 ENCOUNTER — Other Ambulatory Visit: Payer: Self-pay | Admitting: Medical

## 2021-07-29 NOTE — Telephone Encounter (Addendum)
Requesting: ativan  Contract:12/23/2020 UDS:10/2020 Last Visit:05/17/2021 Next Visit:09/19/2021 Last Refill:06/29/2021  Please Advise   Refilled ativan today.  Follow next month. Please schedule.  Mackie Pai, PA-C

## 2021-08-05 ENCOUNTER — Ambulatory Visit (INDEPENDENT_AMBULATORY_CARE_PROVIDER_SITE_OTHER): Payer: PPO | Admitting: Pulmonary Disease

## 2021-08-05 ENCOUNTER — Encounter: Payer: Self-pay | Admitting: Pulmonary Disease

## 2021-08-05 ENCOUNTER — Other Ambulatory Visit: Payer: Self-pay

## 2021-08-05 VITALS — BP 130/78 | HR 95 | Temp 98.4°F | Ht 69.8 in | Wt 209.4 lb

## 2021-08-05 DIAGNOSIS — J411 Mucopurulent chronic bronchitis: Secondary | ICD-10-CM | POA: Diagnosis not present

## 2021-08-05 DIAGNOSIS — J441 Chronic obstructive pulmonary disease with (acute) exacerbation: Secondary | ICD-10-CM | POA: Diagnosis not present

## 2021-08-05 LAB — PULMONARY FUNCTION TEST
DL/VA % pred: 59 %
DL/VA: 2.49 ml/min/mmHg/L
DLCO cor % pred: 44 %
DLCO cor: 11.54 ml/min/mmHg
DLCO unc % pred: 44 %
DLCO unc: 11.54 ml/min/mmHg
FEF 25-75 Post: 0.92 L/sec
FEF 25-75 Pre: 0.7 L/sec
FEF2575-%Change-Post: 31 %
FEF2575-%Pred-Post: 34 %
FEF2575-%Pred-Pre: 26 %
FEV1-%Change-Post: 11 %
FEV1-%Pred-Post: 52 %
FEV1-%Pred-Pre: 46 %
FEV1-Post: 1.75 L
FEV1-Pre: 1.57 L
FEV1FVC-%Change-Post: 2 %
FEV1FVC-%Pred-Pre: 72 %
FEV6-%Change-Post: 7 %
FEV6-%Pred-Post: 71 %
FEV6-%Pred-Pre: 66 %
FEV6-Post: 3.02 L
FEV6-Pre: 2.81 L
FEV6FVC-%Change-Post: 0 %
FEV6FVC-%Pred-Post: 102 %
FEV6FVC-%Pred-Pre: 102 %
FVC-%Change-Post: 8 %
FVC-%Pred-Post: 69 %
FVC-%Pred-Pre: 64 %
FVC-Post: 3.12 L
FVC-Pre: 2.88 L
Post FEV1/FVC ratio: 56 %
Post FEV6/FVC ratio: 97 %
Pre FEV1/FVC ratio: 55 %
Pre FEV6/FVC Ratio: 98 %
RV % pred: 155 %
RV: 3.53 L
TLC % pred: 100 %
TLC: 6.84 L

## 2021-08-05 MED ORDER — AZITHROMYCIN 250 MG PO TABS: 500.0000 mg | ORAL_TABLET | ORAL | 3 refills | Status: DC

## 2021-08-05 MED ORDER — TRELEGY ELLIPTA 100-62.5-25 MCG/INH IN AEPB: INHALATION_SPRAY | RESPIRATORY_TRACT | 5 refills | Status: DC

## 2021-08-05 NOTE — Progress Notes (Signed)
Synopsis: Follow up for COPD  Subjective:   PATIENT ID: Trevor Mack GENDER: male DOB: May 22, 1956, MRN: [id number]  HPI  Chief Complaint  Patient presents with   Follow-up    3 mo f/u with PFT. States his breathing has been stable since last visit. Has noticed that he is getting choked more on food and liquids.     Trevor Mack is a 65 year old male, daily smoker with history of COPD who returns to pulmonary clinic for follow up of COPD.  His breathing is about at baseline currently. He noticed improvement after the prolonged steroid taper and azithromycin after last visit. He notices improvement with the trelegy ellipta inhaler which he has not had recently. He continues to use albuterol inhaler as needed with relief.   He has been treated frequently for COPD exacerbations. He continues to smoke 2 packs per day.   PFTs today show moderately severe obstruction with significant bronchodilator response, normal TLC and moderate diffusion defect.   He has completed an overnight oximetry test at [facility] but we do not have the report yet.   Past Medical History:  Diagnosis Date   ANXIETY 07/23/2008   COLONIC POLYPS, HX OF 03/26/2006   MULTIPLE FRAGMENTS OF TUBULAR ADENOMAS POLYPS   COPD (chronic obstructive pulmonary disease) ([place])    Diverticulosis    Family history of colon cancer    Father    GERD 07/23/2008   Hepatitis C    HYPERLIPIDEMIA 07/23/2008   HYPERTENSION 07/23/2008   TENDINITIS, LEFT THUMB 07/15/2010     Family History  Problem Relation Age of Onset   Colon cancer Father 67   Hypertension Father    Diabetes Father    Heart attack Father    Colon polyps Neg Hx    Esophageal cancer Neg Hx    Rectal cancer Neg Hx    Stomach cancer Neg Hx      Social History   Socioeconomic History   Marital status: Widowed    Spouse name: Not on file   Number of children: 0   Years of education: Not on file   Highest education level: Not on file  Occupational History     Employer: [employer]  Tobacco Use   Smoking status: Every Day    Packs/day: 1.50    Years: 35.00    Pack years: 52.50    Types: Cigarettes   Smokeless tobacco: Never   Tobacco comments:    Started smoking at age 56.  Currently smoking 1 1/2 ppd.  Vaping Use   Vaping Use: Never used  Substance and Sexual Activity   Alcohol use: No   Drug use: No   Sexual activity: Not on file  Other Topics Concern   Not on file  Social History Narrative   Not on file   Social Determinants of Health   Financial Resource Strain: Not on file  Food Insecurity: Not on file  Transportation Needs: Not on file  Physical Activity: Not on file  Stress: Not on file  Social Connections: Not on file  Intimate Partner Violence: Not on file     No Known Allergies   Outpatient Medications Prior to Visit  Medication Sig Dispense Refill   albuterol (PROVENTIL) (2.5 MG/3ML) 0.083% nebulizer solution Take 3 mLs (2.5 mg total) by nebulization every 6 (six) hours as needed for wheezing or shortness of breath. Dx code: J44.9 75 mL 11   albuterol (VENTOLIN HFA) 108 (90 Base) MCG/ACT inhaler Inhale  1-2 puffs into the lungs every 6 (six) hours as needed. 18 g 6   amLODipine (NORVASC) 5 MG tablet Take 1 tablet by mouth once daily 90 tablet 0   atorvastatin (LIPITOR) 40 MG tablet Take 1 tablet by mouth once daily 90 tablet 0   citalopram (CELEXA) 40 MG tablet Take 1 tablet by mouth once daily 90 tablet 0   ibuprofen (ADVIL) 200 MG tablet Take 400 mg by mouth every 6 (six) hours as needed.     ketoconazole (NIZORAL) 2 % cream Apply 1 application topically daily. 15 g 0   LORazepam (ATIVAN) 0.5 MG tablet Take 1 tablet by mouth twice daily 60 tablet 1   losartan (COZAAR) 50 MG tablet Take 1 tablet by mouth once daily 90 tablet 0   omeprazole (PRILOSEC) 40 MG capsule Take 1 capsule (40 mg total) by mouth daily. 90 capsule 3   tamsulosin (FLOMAX) 0.4 MG CAPS capsule Take 1 capsule (0.4 mg total) by mouth  daily. 90 capsule 3   TRELEGY ELLIPTA 100-62.5-25 MCG/INH AEPB Inhale 1 puff by mouth once daily 60 each 0   azithromycin (ZITHROMAX) 250 MG tablet Take as directed 6 tablet 0   predniSONE (DELTASONE) 10 MG tablet Take 1 tablet (10 mg total) by mouth daily with breakfast. 30 tablet 0   No facility-administered medications prior to visit.    Review of Systems  Constitutional:  Negative for chills, fever and weight loss.  HENT:  Negative for congestion, sinus pain and sore throat.   Eyes: Negative.   Respiratory:  Positive for cough, sputum production, shortness of breath and wheezing. Negative for hemoptysis.   Cardiovascular:  Negative for chest pain and leg swelling.  Gastrointestinal:  Negative for abdominal pain, heartburn and nausea.  Genitourinary: Negative.   Musculoskeletal: Negative.   Neurological:  Negative for dizziness, weakness and headaches.  Endo/Heme/Allergies: Negative.   Psychiatric/Behavioral: Negative.     Objective:   Vitals:   08/05/21 1023  BP: 130/78  Pulse: 95  Temp: 98.4 F (36.9 C)  TempSrc: Oral  SpO2: 94%  Weight: 209 lb 6.4 oz (95 kg)  Height: 5' 9.8" (1.773 m)     Physical Exam Constitutional:      General: He is not in acute distress.    Appearance: He is obese. He is not ill-appearing.  HENT:     Head: Normocephalic and atraumatic.     Nose: Nose normal.     Mouth/Throat:     Mouth: Mucous membranes are moist.     Pharynx: Oropharynx is clear.  Eyes:     General: No scleral icterus.    Conjunctiva/sclera: Conjunctivae normal.  Cardiovascular:     Pulses: Normal pulses.     Heart sounds: Normal heart sounds. No murmur heard. Pulmonary:     Effort: No accessory muscle usage or prolonged expiration.     Breath sounds: Decreased air movement present. Decreased breath sounds and rhonchi present. No wheezing or rales.  Abdominal:     General: Bowel sounds are normal.     Palpations: Abdomen is soft.  Musculoskeletal:     Cervical  back: Neck supple.     Right lower leg: No edema.     Left lower leg: No edema.  Skin:    Capillary Refill: Capillary refill takes less than 2 seconds.  Neurological:     General: No focal deficit present.     Mental Status: He is alert.     Gait: Gait normal.  Psychiatric:  Mood and Affect: Mood normal.        Behavior: Behavior normal.        Thought Content: Thought content normal.        Judgment: Judgment normal.   CBC    Component Value Date/Time   WBC 11.3 (H) 11/08/2020 1131   RBC 5.52 11/08/2020 1131   HGB 15.5 11/08/2020 1131   HCT 46.5 11/08/2020 1131   PLT 188.0 11/08/2020 1131   MCV 84.1 11/08/2020 1131   MCHC 33.3 11/08/2020 1131   RDW 15.3 11/08/2020 1131   LYMPHSABS 2.2 11/08/2020 1131   MONOABS 0.6 11/08/2020 1131   EOSABS 0.1 11/08/2020 1131   BASOSABS 0.1 11/08/2020 1131   BMP Latest Ref Rng & Units 02/17/2021 11/08/2020 05/26/2020  Glucose 70 - 99 mg/dL 93 76 93  BUN 6 - 23 mg/dL '12 13 19  '$ Creatinine 0.40 - 1.50 mg/dL 0.99 1.06 1.02  Sodium 135 - 145 mEq/L 139 138 137  Potassium 3.5 - 5.1 mEq/L 3.9 3.9 4.1  Chloride 96 - 112 mEq/L 101 100 101  CO2 19 - 32 mEq/L '30 29 29  '$ Calcium 8.4 - 10.5 mg/dL 8.8 8.8 8.8     Chest imaging: CT Chest 01/19/2021 Mediastinum/Nodes: No mediastinal lymphadenopathy. No evidence for gross hilar lymphadenopathy although assessment is limited by the lack of intravenous contrast on today's study. Tiny hiatal hernia. The esophagus has normal imaging features. There is no axillary lymphadenopathy.   Lungs/Pleura: Centrilobular emphsyema noted. Scattered solid and non solid bilateral pulmonary nodules are identified. No overtly suspicious nodule or mass. No focal airspace consolidation. No pleural effusion.  CXR 10/2018 There is no edema or consolidation. The heart size and pulmonary vascularity are normal. No adenopathy. No evident bone lesions.  PFT: PFT Results Latest Ref Rng & Units 08/05/2021 04/12/2017   FVC-Pre L 2.88 2.43  FVC-Predicted Pre % 64 52  FVC-Post L 3.12 2.85  FVC-Predicted Post % 69 61  Pre FEV1/FVC % % 55 62  Post FEV1/FCV % % 56 61  FEV1-Pre L 1.57 1.49  FEV1-Predicted Pre % 46 42  FEV1-Post L 1.75 1.75  DLCO uncorrected ml/min/mmHg 11.54 12.29  DLCO UNC% % 44 39  DLCO corrected ml/min/mmHg 11.54 11.82  DLCO COR %Predicted % 44 38  DLVA Predicted % 59 61  TLC L 6.84 7.02  TLC % Predicted % 100 103  RV % Predicted % 155 214    PFT 2022: Moderate severe obstruction, significant bronchodilator response  Assessment & Plan:   Mucopurulent chronic bronchitis (HCC) - Plan: azithromycin (ZITHROMAX) 250 MG tablet, EKG 12-Lead  Discussion: [facility] Mack is a 65 year old male, daily smoker with history of COPD who returns to pulmonary clinic for follow up.   His breathing appears at baseline but he continues to have significant cough with sputum production daily along with rhonchi on exam today.   He is to continue on trelegy ellipta 1 puff daily and albuterol inhaler as needed.   We will start him on azithromycin prophylaxis therapy for his frequent COPD exacerbations. We will check EKG today for QT monitoring,  Qtc is 470. He is to take azithromycin '500mg'$  three times per week.  Again, I have strongly encouraged him to reduce his smoking and to stop all together. He continues to smoke 2 packs per day. I encouraged him to use nicotine patches in order to cut back further but he continues to express reluctance in smoking cessation methods.   He is enrolled in our  lung cancer screening program. Last scan was 01/19/21, lung-rads 2.  Awaiting overnight oximetry results from [facility].    Patient to follow up in 3 months.  Freda Jackson, MD [facility] Office: [phone]     Current Outpatient Medications:    albuterol (PROVENTIL) (2.5 MG/3ML) 0.083% nebulizer solution, Take 3 mLs (2.5 mg total) by nebulization every 6 (six) hours as needed for  wheezing or shortness of breath. Dx code: J44.9, Disp: 75 mL, Rfl: 11   albuterol (VENTOLIN HFA) 108 (90 Base) MCG/ACT inhaler, Inhale 1-2 puffs into the lungs every 6 (six) hours as needed., Disp: 18 g, Rfl: 6   amLODipine (NORVASC) 5 MG tablet, Take 1 tablet by mouth once daily, Disp: 90 tablet, Rfl: 0   atorvastatin (LIPITOR) 40 MG tablet, Take 1 tablet by mouth once daily, Disp: 90 tablet, Rfl: 0   azithromycin (ZITHROMAX) 250 MG tablet, Take 2 tablets (500 mg total) by mouth 3 (three) times a week., Disp: 30 tablet, Rfl: 3   citalopram (CELEXA) 40 MG tablet, Take 1 tablet by mouth once daily, Disp: 90 tablet, Rfl: 0   ibuprofen (ADVIL) 200 MG tablet, Take 400 mg by mouth every 6 (six) hours as needed., Disp: , Rfl:    ketoconazole (NIZORAL) 2 % cream, Apply 1 application topically daily., Disp: 15 g, Rfl: 0   LORazepam (ATIVAN) 0.5 MG tablet, Take 1 tablet by mouth twice daily, Disp: 60 tablet, Rfl: 1   losartan (COZAAR) 50 MG tablet, Take 1 tablet by mouth once daily, Disp: 90 tablet, Rfl: 0   omeprazole (PRILOSEC) 40 MG capsule, Take 1 capsule (40 mg total) by mouth daily., Disp: 90 capsule, Rfl: 3   tamsulosin (FLOMAX) 0.4 MG CAPS capsule, Take 1 capsule (0.4 mg total) by mouth daily., Disp: 90 capsule, Rfl: 3   TRELEGY ELLIPTA 100-62.5-25 MCG/INH AEPB, Inhale 1 puff by mouth once daily, Disp: 60 each, Rfl: 0

## 2021-08-05 NOTE — Patient Instructions (Signed)
Continue trelegy ellipta 1 puff daily  Use albuterol as needed 1-2 puffs every 4-6 hours as needed  Start azithromycin '500mg'$  3 times per week

## 2021-08-05 NOTE — Progress Notes (Signed)
PFT done today. 

## 2021-08-05 NOTE — Addendum Note (Signed)
Addended by: Valerie Salts on: 08/05/2021 11:27 AM   Modules accepted: Orders

## 2021-08-17 ENCOUNTER — Other Ambulatory Visit: Payer: Self-pay | Admitting: Pulmonary Disease

## 2021-08-24 ENCOUNTER — Ambulatory Visit (INDEPENDENT_AMBULATORY_CARE_PROVIDER_SITE_OTHER): Payer: PPO

## 2021-08-24 VITALS — Ht 69.0 in | Wt 209.0 lb

## 2021-08-24 DIAGNOSIS — Z0000 Encounter for general adult medical examination without abnormal findings: Secondary | ICD-10-CM | POA: Diagnosis not present

## 2021-08-24 NOTE — Progress Notes (Addendum)
Subjective:   Trevor Mack is a 65 y.o. male who presents for an Initial Medicare Annual Wellness Visit.  I connected with [name] today by telephone and verified that I am speaking with the correct person using two identifiers. Location patient: home Location provider: work Persons participating in the virtual visit: patient, Marine scientist.    I discussed the limitations, risks, security and privacy concerns of performing an evaluation and management service by telephone and the availability of in person appointments. I also discussed with the patient that there may be a patient responsible charge related to this service. The patient expressed understanding and verbally consented to this telephonic visit.    Interactive audio and video telecommunications were attempted between this provider and patient, however failed, due to patient having technical difficulties OR patient did not have access to video capability.  We continued and completed visit with audio only.  Some vital signs may be absent or patient reported.   Time Spent with patient on telephone encounter: 20 minutes   Review of Systems     Cardiac Risk Factors include: advanced age (>60mn, >>18women);hypertension;sedentary lifestyle;obesity (BMI >30kg/m2);smoking/ tobacco exposure     Objective:    Today's Vitals   08/24/21 0820  Weight: 209 lb (94.8 kg)  Height: '5\' 9"'$  (1.753 m)   Body mass index is 30.86 kg/m.  Advanced Directives 08/24/2021 01/01/2018  Does Patient Have a Medical Advance Directive? Yes Yes  Type of [place]aramed[place]of [place] will Living will  Does patient want to make changes to medical advance directive? - No - Patient declined  Copy of HMayin Chart? No - copy requested -    Current Medications (verified) Outpatient Encounter Medications as of 08/24/2021  Medication Sig   albuterol (PROVENTIL) (2.5 MG/3ML) 0.083% nebulizer solution Take 3 mLs (2.5  mg total) by nebulization every 6 (six) hours as needed for wheezing or shortness of breath. Dx code: J44.9   albuterol (VENTOLIN HFA) 108 (90 Base) MCG/ACT inhaler INHALE 1 TO 2 PUFFS BY MOUTH EVERY 6 HOURS AS NEEDED   amLODipine (NORVASC) 5 MG tablet Take 1 tablet by mouth once daily   atorvastatin (LIPITOR) 40 MG tablet Take 1 tablet by mouth once daily   citalopram (CELEXA) 40 MG tablet Take 1 tablet by mouth once daily   Fluticasone-Umeclidin-Vilant (TRELEGY ELLIPTA) 100-62.5-25 MCG/INH AEPB Inhale 1 puff by mouth once daily   ibuprofen (ADVIL) 200 MG tablet Take 400 mg by mouth every 6 (six) hours as needed.   ketoconazole (NIZORAL) 2 % cream Apply 1 application topically daily.   LORazepam (ATIVAN) 0.5 MG tablet Take 1 tablet by mouth twice daily   losartan (COZAAR) 50 MG tablet Take 1 tablet by mouth once daily   omeprazole (PRILOSEC) 40 MG capsule Take 1 capsule (40 mg total) by mouth daily.   tamsulosin (FLOMAX) 0.4 MG CAPS capsule Take 1 capsule (0.4 mg total) by mouth daily.   [DISCONTINUED] azithromycin (ZITHROMAX) 250 MG tablet Take 2 tablets (500 mg total) by mouth 3 (three) times a week.   No facility-administered encounter medications on file as of 08/24/2021.    Allergies (verified) Patient has no known allergies.   History: Past Medical History:  Diagnosis Date   ANXIETY 07/23/2008   COLONIC POLYPS, HX OF 03/26/2006   MULTIPLE FRAGMENTS OF TUBULAR ADENOMAS POLYPS   COPD (chronic obstructive pulmonary disease) (HCC)    Diverticulosis    Family history of colon cancer  Father    GERD 07/23/2008   Hepatitis C    HYPERLIPIDEMIA 07/23/2008   HYPERTENSION 07/23/2008   TENDINITIS, LEFT THUMB 07/15/2010   Past Surgical History:  Procedure Laterality Date   COLONOSCOPY  11/27/2012   LUMBAR LAMINECTOMY     Family History  Problem Relation Age of Onset   Colon cancer Father 58   Hypertension Father    Diabetes Father    Heart attack Father    Colon polyps Neg Hx     Esophageal cancer Neg Hx    Rectal cancer Neg Hx    Stomach cancer Neg Hx    Social History   Socioeconomic History   Marital status: Widowed    Spouse name: Not on file   Number of children: 0   Years of education: Not on file   Highest education level: Not on file  Occupational History    Employer: [employer]  Tobacco Use   Smoking status: Every Day    Packs/day: 1.50    Years: 35.00    Pack years: 52.50    Types: Cigarettes   Smokeless tobacco: Never   Tobacco comments:    Started smoking at age 61.  Currently smoking 1 1/2 ppd.  Vaping Use   Vaping Use: Never used  Substance and Sexual Activity   Alcohol use: No   Drug use: No   Sexual activity: Not on file  Other Topics Concern   Not on file  Social History Narrative   Not on file   Social Determinants of Health   Financial Resource Strain: Low Risk    Difficulty of Paying Living Expenses: Not hard at all  Food Insecurity: No Food Insecurity   Worried About Charity fundraiser in the Last Year: Never true   [place] in the Last Year: Never true  Transportation Needs: No Transportation Needs   Lack of Transportation (Medical): No   Lack of Transportation (Non-Medical): No  Physical Activity: Inactive   Days of Exercise per Week: 0 days   Minutes of Exercise per Session: 0 min  Stress: No Stress Concern Present   Feeling of Stress : Not at all  Social Connections: Socially Isolated   Frequency of Communication with Friends and Family: More than three times a week   Frequency of Social Gatherings with Friends and Family: More than three times a week   Attends Religious Services: Never   Marine scientist or Organizations: No   Attends Archivist Meetings: Never   Marital Status: Widowed    Tobacco Counseling Ready to quit: Not Answered Counseling given: Not Answered Tobacco comments: Started smoking at age 63.  Currently smoking 1 1/2 ppd.   Clinical  Intake:  Pre-visit preparation completed: Yes  Pain : No/denies pain     BMI - recorded: 30.86 Nutritional Status: BMI > 30  Obese Nutritional Risks: None Diabetes: No  How often do you need to have someone help you when you read instructions, pamphlets, or other written materials from your doctor or pharmacy?: 1 - Never  Diabetic?No  Interpreter Needed?: No  Information entered by :: Caroleen Hamman LPN   Activities of Daily Living In your present state of health, do you have any difficulty performing the following activities: 08/24/2021  Hearing? N  Vision? N  Difficulty concentrating or making decisions? Y  Comment occasionally  Walking or climbing stairs? N  Dressing or bathing? N  Doing errands, shopping? N  Preparing Food  and eating ? N  Using the Toilet? N  In the past six months, have you accidently leaked urine? N  Do you have problems with loss of bowel control? N  Managing your Medications? N  Managing your Finances? N  Housekeeping or managing your Housekeeping? N  Some recent data might be hidden    Patient Care Team: Saguier, Iris Pert as PCP - General (Internal Medicine) Elsie Stain, MD as Attending Physician (Pulmonary Disease)  Indicate any recent Medical Services you may have received from other than [facility] providers in the past year (date may be approximate).     Assessment:   This is a routine wellness examination for Ambrose.  Hearing/Vision screen Hearing Screening - Comments:: No issues Vision Screening - Comments:: Last eye exam-2021  Dietary issues and exercise activities discussed: Current Exercise Habits: The patient does not participate in regular exercise at present, Exercise limited by: respiratory conditions(s) (Emphysema)   Goals Addressed             This Visit's Progress    Patient Stated       Cut back on salt intake       Depression Screen PHQ 2/9 Scores 08/24/2021 05/17/2021 01/12/2020 05/23/2018 05/09/2018  07/02/2015  PHQ - 2 Score 0 0 0 0 0 0  PHQ- 9 Score - - 0 - - -    Fall Risk Fall Risk  08/24/2021 05/23/2018 05/09/2018  Falls in the past year? 0 No No  Number falls in past yr: 0 - -  Injury with Fall? 0 - -  Follow up Falls prevention discussed - -    FALL RISK PREVENTION PERTAINING TO THE HOME:  Any stairs in or around the home? Yes  If so, are there any without handrails? No  Home free of loose throw rugs in walkways, pet beds, electrical cords, etc? Yes  Adequate lighting in your home to reduce risk of falls? Yes   ASSISTIVE DEVICES UTILIZED TO PREVENT FALLS:  Life alert? No  Use of a cane, walker or w/c? No  Grab bars in the bathroom? No  Shower chair or bench in shower? No  Elevated toilet seat or a handicapped toilet? No   TIMED UP AND GO:  Was the test performed? No . Phone visit   Cognitive Function:Normal cognitive status assessed by this Nurse Health Advisor. No abnormalities found.       6CIT Screen 08/24/2021  What Year? 0 points  What month? 0 points  What time? 0 points  Count back from 20 0 points  Months in reverse 4 points  Repeat phrase 0 points  Total Score 4    Immunizations Immunization History  Administered Date(s) Administered   Fluad Quad(high Dose 65+) 09/06/2020   Hepatitis A, Adult 05/23/2018   Hepatitis B, adult 05/23/2018   Influenza Whole 10/23/2012   Influenza,inj,Quad PF,6+ Mos 10/05/2017, 09/12/2018, 10/08/2019, 09/23/2020   Influenza-Unspecified 09/05/2016   Moderna Sars-Covid-2 Vaccination 03/09/2020, 04/06/2020   Pneumococcal Conjugate-13 10/30/2017   Pneumococcal Polysaccharide-23 03/05/2018   Tdap 11/13/2018, 05/17/2020    TDAP status: Up to date  Flu Vaccine status: Due, Education has been provided regarding the importance of this vaccine. Advised may receive this vaccine at local pharmacy or Health Dept. Aware to provide a copy of the vaccination record if obtained from local pharmacy or Health Dept. Verbalized  acceptance and understanding.  Pneumococcal vaccine status: Up to date  Covid-19 vaccine status: Information provided on how to obtain vaccines. Booster due  Qualifies for Shingles Vaccine? Yes   Zostavax completed No   Shingrix Completed?: No.    Education has been provided regarding the importance of this vaccine. Patient has been advised to call insurance company to determine out of pocket expense if they have not yet received this vaccine. Advised may also receive vaccine at local pharmacy or Health Dept. Verbalized acceptance and understanding.  Screening Tests Health Maintenance  Topic Date Due   HIV Screening  Never done   Zoster Vaccines- Shingrix (1 of 2) Never done   COVID-19 Vaccine (3 - Moderna risk series) 05/04/2020   INFLUENZA VACCINE  07/25/2021   COLONOSCOPY (Pts 45-58yr Insurance coverage will need to be confirmed)  02/07/2022   Pneumococcal Vaccine 02661Years old (3 - PPSV23 or PCV20) 03/06/2023   TETANUS/TDAP  05/17/2030   Hepatitis C Screening  Completed   HPV VACCINES  Aged Out    Health Maintenance  Health Maintenance Due  Topic Date Due   HIV Screening  Never done   Zoster Vaccines- Shingrix (1 of 2) Never done   COVID-19 Vaccine (3 - Moderna risk series) 05/04/2020   INFLUENZA VACCINE  07/25/2021    Colorectal cancer screening: Type of screening: Colonoscopy. Completed 02/07/2021. Repeat every 1 years  Lung Cancer Screening: CT chest completed 01/19/2021    Additional Screening:  Hepatitis C Screening: Has a diagnosis of chronic Hep C  Vision Screening: Recommended annual ophthalmology exams for early detection of glaucoma and other disorders of the eye. Is the patient up to date with their annual eye exam?  Yes  Who is the provider or what is the name of the office in which the patient attends annual eye exams? Pt unsure of name   Dental Screening: Recommended annual dental exams for proper oral hygiene  Community Resource Referral / Chronic  Care Management: CRR required this visit?  No   CCM required this visit?  No      Plan:     I have personally reviewed and noted the following in the patient's chart:   Medical and social history Use of alcohol, tobacco or illicit drugs  Current medications and supplements including opioid prescriptions. Patient is not currently taking opioid prescriptions. Functional ability and status Nutritional status Physical activity Advanced directives List of other physicians Hospitalizations, surgeries, and ER visits in previous 12 months Vitals Screenings to include cognitive, depression, and falls Referrals and appointments  In addition, I have reviewed and discussed with patient certain preventive protocols, quality metrics, and best practice recommendations. A written personalized care plan for preventive services as well as general preventive health recommendations were provided to patient.   Due to this being a telephonic visit, the after visit summary with patients personalized plan was offered to patient via mail or my-chart.  Per request, patient was mailed a copy of [place] LPN   [id number] Nurse Health Advisor  Nurse Notes: None  Agree with assessment & plan of lpn.  EMackie Pai PA-C

## 2021-08-24 NOTE — Patient Instructions (Signed)
Trevor Mack , Thank you for taking time to complete your Medicare Wellness Visit. I appreciate your ongoing commitment to your health goals. Please review the following plan we discussed and let me know if I can assist you in the future.   Screening recommendations/referrals: Colonoscopy: Completed 02/07/2021-Due 02/07/2022 Recommended yearly ophthalmology/optometry visit for glaucoma screening and checkup Recommended yearly dental visit for hygiene and checkup  Vaccinations: Influenza vaccine: Due 08/2021 Pneumococcal vaccine: Up to date Tdap vaccine: Up to date-Due-05/17/2030 Shingles vaccine: Discuss with PCP   Covid-19: Booster due  Advanced directives: Please bring a copy for your chart  Conditions/risks identified: See problem list  Next appointment: Follow up in one year for your annual wellness visit 08/30/2022 @ 9:00  Preventive Care 40-64 Years, Male Preventive care refers to lifestyle choices and visits with your health care provider that can promote health and wellness. What does preventive care include? A yearly physical exam. This is also called an annual well check. Dental exams once or twice a year. Routine eye exams. Ask your health care provider how often you should have your eyes checked. Personal lifestyle choices, including: Daily care of your teeth and gums. Regular physical activity. Eating a healthy diet. Avoiding tobacco and drug use. Limiting alcohol use. Practicing safe sex. Taking low-dose aspirin every day starting at age 83. What happens during an annual well check? The services and screenings done by your health care provider during your annual well check will depend on your age, overall health, lifestyle risk factors, and family history of disease. Counseling  Your health care provider may ask you questions about your: Alcohol use. Tobacco use. Drug use. Emotional well-being. Home and relationship well-being. Sexual activity. Eating habits. Work  and work Statistician. Screening  You may have the following tests or measurements: Height, weight, and BMI. Blood pressure. Lipid and cholesterol levels. These may be checked every 5 years, or more frequently if you are over 74 years old. Skin check. Lung cancer screening. You may have this screening every year starting at age 50 if you have a 30-pack-year history of smoking and currently smoke or have quit within the past 15 years. Fecal occult blood test (FOBT) of the stool. You may have this test every year starting at age 81. Flexible sigmoidoscopy or colonoscopy. You may have a sigmoidoscopy every 5 years or a colonoscopy every 10 years starting at age 42. Prostate cancer screening. Recommendations will vary depending on your family history and other risks. Hepatitis C blood test. Hepatitis B blood test. Sexually transmitted disease (STD) testing. Diabetes screening. This is done by checking your blood sugar (glucose) after you have not eaten for a while (fasting). You may have this done every 1-3 years. Discuss your test results, treatment options, and if necessary, the need for more tests with your health care provider. Vaccines  Your health care provider may recommend certain vaccines, such as: Influenza vaccine. This is recommended every year. Tetanus, diphtheria, and acellular pertussis (Tdap, Td) vaccine. You may need a Td booster every 10 years. Zoster vaccine. You may need this after age 68. Pneumococcal 13-valent conjugate (PCV13) vaccine. You may need this if you have certain conditions and have not been vaccinated. Pneumococcal polysaccharide (PPSV23) vaccine. You may need one or two doses if you smoke cigarettes or if you have certain conditions. Talk to your health care provider about which screenings and vaccines you need and how often you need them. This information is not intended to replace advice given to you  by your health care provider. Make sure you discuss any  questions you have with your health care provider. Document Released: 01/07/2016 Document Revised: 08/30/2016 Document Reviewed: 10/12/2015 Elsevier Interactive Patient Education  2017 [place] Prevention in the Home Falls can cause injuries. They can happen to people of all ages. There are many things you can do to make your home safe and to help prevent falls. What can I do on the outside of my home? Regularly fix the edges of walkways and driveways and fix any cracks. Remove anything that might make you trip as you walk through a door, such as a raised step or threshold. Trim any bushes or trees on the path to your home. Use bright outdoor lighting. Clear any walking paths of anything that might make someone trip, such as rocks or tools. Regularly check to see if handrails are loose or broken. Make sure that both sides of any steps have handrails. Any raised decks and porches should have guardrails on the edges. Have any leaves, snow, or ice cleared regularly. Use sand or salt on walking paths during winter. Clean up any spills in your garage right away. This includes oil or grease spills. What can I do in the bathroom? Use night lights. Install grab bars by the toilet and in the tub and shower. Do not use towel bars as grab bars. Use non-skid mats or decals in the tub or shower. If you need to sit down in the shower, use a plastic, non-slip stool. Keep the floor dry. Clean up any water that spills on the floor as soon as it happens. Remove soap buildup in the tub or shower regularly. Attach bath mats securely with double-sided non-slip rug tape. Do not have throw rugs and other things on the floor that can make you trip. What can I do in the bedroom? Use night lights. Make sure that you have a light by your bed that is easy to reach. Do not use any sheets or blankets that are too big for your bed. They should not hang down onto the floor. Have a firm chair that has side  arms. You can use this for support while you get dressed. Do not have throw rugs and other things on the floor that can make you trip. What can I do in the kitchen? Clean up any spills right away. Avoid walking on wet floors. Keep items that you use a lot in easy-to-reach places. If you need to reach something above you, use a strong step stool that has a grab bar. Keep electrical cords out of the way. Do not use floor polish or wax that makes floors slippery. If you must use wax, use non-skid floor wax. Do not have throw rugs and other things on the floor that can make you trip. What can I do with my stairs? Do not leave any items on the stairs. Make sure that there are handrails on both sides of the stairs and use them. Fix handrails that are broken or loose. Make sure that handrails are as long as the stairways. Check any carpeting to make sure that it is firmly attached to the stairs. Fix any carpet that is loose or worn. Avoid having throw rugs at the top or bottom of the stairs. If you do have throw rugs, attach them to the floor with carpet tape. Make sure that you have a light switch at the top of the stairs and the bottom of the stairs. If  you do not have them, ask someone to add them for you. What else can I do to help prevent falls? Wear shoes that: Do not have high heels. Have rubber bottoms. Are comfortable and fit you well. Are closed at the toe. Do not wear sandals. If you use a stepladder: Make sure that it is fully opened. Do not climb a closed stepladder. Make sure that both sides of the stepladder are locked into place. Ask someone to hold it for you, if possible. Clearly mark and make sure that you can see: Any grab bars or handrails. First and last steps. Where the edge of each step is. Use tools that help you move around (mobility aids) if they are needed. These include: Canes. Walkers. Scooters. Crutches. Turn on the lights when you go into a dark area.  Replace any light bulbs as soon as they burn out. Set up your furniture so you have a clear path. Avoid moving your furniture around. If any of your floors are uneven, fix them. If there are any pets around you, be aware of where they are. Review your medicines with your doctor. Some medicines can make you feel dizzy. This can increase your chance of falling. Ask your doctor what other things that you can do to help prevent falls. This information is not intended to replace advice given to you by your health care provider. Make sure you discuss any questions you have with your health care provider. Document Released: 10/07/2009 Document Revised: 05/18/2016 Document Reviewed: 01/15/2015 Elsevier Interactive Patient Education  2017 [employer].

## 2021-08-30 ENCOUNTER — Other Ambulatory Visit: Payer: Self-pay | Admitting: Medical

## 2021-09-05 ENCOUNTER — Other Ambulatory Visit: Payer: Self-pay | Admitting: Medical

## 2021-09-19 ENCOUNTER — Ambulatory Visit (INDEPENDENT_AMBULATORY_CARE_PROVIDER_SITE_OTHER): Payer: PPO | Admitting: Medical

## 2021-09-19 ENCOUNTER — Ambulatory Visit (HOSPITAL_BASED_OUTPATIENT_CLINIC_OR_DEPARTMENT_OTHER)
Admission: RE | Admit: 2021-09-19 | Discharge: 2021-09-19 | Disposition: A | Discharge status: Home / Self Care | Payer: PPO | Source: Ambulatory Visit | Attending: Medical | Admitting: Medical

## 2021-09-19 ENCOUNTER — Other Ambulatory Visit: Payer: Self-pay

## 2021-09-19 VITALS — BP 130/80 | HR 100 | Temp 98.2°F | Resp 18 | Ht 69.0 in | Wt 208.4 lb

## 2021-09-19 DIAGNOSIS — R059 Cough, unspecified: Secondary | ICD-10-CM | POA: Insufficient documentation

## 2021-09-19 DIAGNOSIS — I10 Essential (primary) hypertension: Secondary | ICD-10-CM | POA: Diagnosis not present

## 2021-09-19 DIAGNOSIS — J438 Other emphysema: Secondary | ICD-10-CM

## 2021-09-19 DIAGNOSIS — F3289 Other specified depressive episodes: Secondary | ICD-10-CM | POA: Diagnosis not present

## 2021-09-19 DIAGNOSIS — Z79899 Other long term (current) drug therapy: Secondary | ICD-10-CM | POA: Diagnosis not present

## 2021-09-19 DIAGNOSIS — Z23 Encounter for immunization: Secondary | ICD-10-CM | POA: Diagnosis not present

## 2021-09-19 DIAGNOSIS — R351 Nocturia: Secondary | ICD-10-CM

## 2021-09-19 DIAGNOSIS — F419 Anxiety disorder, unspecified: Secondary | ICD-10-CM

## 2021-09-19 DIAGNOSIS — R972 Elevated prostate specific antigen [PSA]: Secondary | ICD-10-CM

## 2021-09-19 DIAGNOSIS — N401 Enlarged prostate with lower urinary tract symptoms: Secondary | ICD-10-CM | POA: Diagnosis not present

## 2021-09-19 DIAGNOSIS — F17200 Nicotine dependence, unspecified, uncomplicated: Secondary | ICD-10-CM

## 2021-09-19 DIAGNOSIS — E785 Hyperlipidemia, unspecified: Secondary | ICD-10-CM

## 2021-09-19 DIAGNOSIS — J441 Chronic obstructive pulmonary disease with (acute) exacerbation: Secondary | ICD-10-CM

## 2021-09-19 DIAGNOSIS — K219 Gastro-esophageal reflux disease without esophagitis: Secondary | ICD-10-CM | POA: Diagnosis not present

## 2021-09-19 LAB — PSA: PSA: 5.3 ng/mL — ABNORMAL HIGH (ref 0.10–4.00)

## 2021-09-19 NOTE — Progress Notes (Signed)
Subjective:    Patient ID: Trevor Mack, male    DOB: Jul 26, 1956, 65 y.o.   MRN: [id number]  HPI  Pt anxiety is controlled with ativan. uds and contract up to date. [place] drug data base reviewed. Pt not  due for refill of ativan yet. Will update uds and contract today as he will need to do so in November.  Some mild depression. He is celexa and states mood is not bad.   Review of Systems  Constitutional:  Negative for chills, fatigue and fever.  HENT:  Negative for congestion and drooling.   Respiratory:  Positive for cough. Negative for chest tightness, shortness of breath and wheezing.        Daily cough. Some production.  Cardiovascular:  Negative for chest pain and palpitations.  Gastrointestinal:  Negative for abdominal pain, blood in stool and nausea.  Genitourinary:  Negative for dysuria, flank pain and frequency.  Musculoskeletal:  Negative for back pain, myalgias and neck stiffness.  Skin:  Negative for rash.  Neurological:  Negative for dizziness and headaches.  Hematological:  Negative for adenopathy. Does not bruise/bleed easily.  Psychiatric/Behavioral:  Negative for behavioral problems, decreased concentration, dysphoric mood and hallucinations. The patient is not nervous/anxious.        Mood and anxiety controlled.   For copd on trelegy. Followed by pulmonologist.   "Chronic obstructive lung disease with acute on chronic bronchitis and exacerbation gold stage C -   Pt tells me when given prednisone and zpack below he could not tolerate.  "Discussion: Trevor Mack is a 65 year old male, daily smoker with history of COPD who returns to pulmonary clinic for follow up.   He is having an acute exacerbation of his COPD. We will treat him with an extended steroid taper and course of azithromycin.    Strongly encouraged him to reduced his smoking further and to stop all together. He continues to smoke 1.5 packs per day. I encouraged him to use nicotine patches in order  to cut back further but he continues to express reluctance in smoking cessation methods.    On simple walk today he completed 1 lap without oxygen desaturations but stopped the walk due to feeling dizzy/lightheaded.    He is enrolled in our lung cancer screening program. Last scan was 01/19/21, lung-rads 2.   Will obtain the overnight oximetry results from [facility]. "  Pt tells me breathing stable/ok today.   Pt is still smoking. Currently 2 pack a day smoker.  For high cholesterol on  atorvastatin.   Gerd well controlled with omeprazole.  Pt is planning to get covid vaccine.  Hx of htn. BP is well controlled. Pt on amlodipine and losartan.  Hyperlipidemia. On atorvastatin.   Bph type symptoms I the past. His eye MD told him to stop taking. Pt has glaucoma. He has been urinating frequent about every 2 hours at night over past 2 years per his report.     Objective:   Physical Exam  General Mental Status- Alert. General Appearance- Not in acute distress.   Skin General: Color- Normal Color. Moisture- Normal Moisture.  Neck Carotid Arteries- Normal color. Moisture- Normal Moisture. No carotid bruits. No JVD.  Chest and Lung Exam Auscultation: Breath Sounds:-shallow, even and unlabored. Diffuse rough breath sounds.  Cardiovascular Auscultation:Rythm- Regular. Murmurs & Other Heart Sounds:Auscultation of the heart reveals- No Murmurs.  Abdomen Inspection:-Inspeection Normal. Palpation/Percussion:Note:No mass. Palpation and Percussion of the abdomen reveal- Non Tender, Non Distended + BS, no  rebound or guarding.   Neurologic Cranial Nerve exam:- CN III-XII intact(No nystagmus), symmetric smile. Strength:- 5/5 equal and symmetric strength both upper and lower extremities.       Assessment & Plan:   Patient Instructions  Hx of htn. Bp well controlled. Continue losartan and amlodipine.  For anxiety and high risk med use get up to date contract and uds. Will review  resuls and refill ativan when needed.  For depression continue celexa.  Gerd contolled continue omeprazole.  For high cholesterol will get cmp and lipid panel. Currently on atorvastatin.  Recommend quit smoking/at least cut back from 2 pack a day hx. For copd continue trelegy and use albuterol if needed. Follow up with pulmonologist as scheduled.  For hx of bph and frequent urination at night will get psa. After result review may refer to urologist as med rx'd in past dc'd after eye MD explained not to take.   Follow up in 4 months or sooner if needed.   Mackie Pai, PA-C

## 2021-09-19 NOTE — Addendum Note (Signed)
Addended by: Anabel Halon on: 09/19/2021 06:05 PM   Modules accepted: Orders

## 2021-09-19 NOTE — Patient Instructions (Addendum)
Hx of htn. Bp well controlled. Continue losartan and amlodipine.  For anxiety and high risk med use get up to date contract and uds. Will review resuls and refill ativan when needed.  For depression continue celexa.  Gerd contolled continue omeprazole.  For high cholesterol will get cmp and lipid panel. Currently on atorvastatin.  Recommend quit smoking/at least cut back from 2 pack a day hx. For copd continue trelegy and use albuterol if needed. Follow up with pulmonologist as scheduled.  Cxr today. Want to assess since accessory lung sounds seem rougher than usual. After review decide if antibiotics needed.  For hx of bph and frequent urination at night will get psa. After result review may refer to urologist as med rx'd in past dc'd after eye MD explained not to take.   Follow up in 4 months or sooner if needed.

## 2021-09-23 LAB — DRUG MONITORING PANEL 376104, URINE
Alphahydroxyalprazolam: NEGATIVE ng/mL (ref <25)
Alphahydroxymidazolam: NEGATIVE ng/mL (ref <50)
Alphahydroxytriazolam: NEGATIVE ng/mL (ref <50)
Aminoclonazepam: NEGATIVE ng/mL (ref <25)
Amphetamines: NEGATIVE ng/mL (ref <500)
Barbiturates: NEGATIVE ng/mL (ref <300)
Benzodiazepines: POSITIVE ng/mL — AB (ref <100)
Cocaine Metabolite: NEGATIVE ng/mL (ref <150)
Desmethyltramadol: NEGATIVE ng/mL (ref <100)
Hydroxyethylflurazepam: NEGATIVE ng/mL (ref <50)
Lorazepam: 804 ng/mL — ABNORMAL HIGH (ref <50)
Nordiazepam: NEGATIVE ng/mL (ref <50)
Opiates: NEGATIVE ng/mL (ref <100)
Oxazepam: NEGATIVE ng/mL (ref <50)
Oxycodone: NEGATIVE ng/mL (ref <100)
Temazepam: NEGATIVE ng/mL (ref <50)
Tramadol: NEGATIVE ng/mL (ref <100)

## 2021-09-23 LAB — DM TEMPLATE

## 2021-09-29 ENCOUNTER — Other Ambulatory Visit: Payer: Self-pay | Admitting: Medical

## 2021-09-29 NOTE — Telephone Encounter (Signed)
Requesting: ativan Contract:09/26/2021 UDS:09/19/2021 Last Visit:09/19/2021 Next Visit:01/19/2022 Last Refill:08/01/21  Please Advise

## 2021-10-17 ENCOUNTER — Other Ambulatory Visit: Payer: Self-pay | Admitting: Medical

## 2021-10-24 ENCOUNTER — Other Ambulatory Visit: Payer: Self-pay | Admitting: Medical

## 2021-10-31 ENCOUNTER — Other Ambulatory Visit: Payer: Self-pay | Admitting: Medical

## 2021-10-31 NOTE — Telephone Encounter (Addendum)
Requesting: ativan Contract:09/26/2021 UDS:09/19/21 Last Visit:09/19/21 Next Visit:01/19/22 Last Refill:09/29/21  Please Advise.  Rx refill sent to pharmacy.  Mackie Pai, PA-C

## 2021-11-10 NOTE — Progress Notes (Deleted)
Synopsis: Follow up for COPD  Subjective:   PATIENT ID: Trevor Mack GENDER: male DOB: 01/26/1956, MRN: [id number]  HPI  No chief complaint on file.   Trevor Mack is a 65 year old male, daily smoker with history of COPD who returns to pulmonary clinic for follow up of COPD.  His breathing is about at baseline currently. He noticed improvement after the prolonged steroid taper and azithromycin after last visit. He notices improvement with the trelegy ellipta inhaler which he has not had recently. He continues to use albuterol inhaler as needed with relief.   He has been treated frequently for COPD exacerbations. He continues to smoke 2 packs per day.   PFTs today show moderately severe obstruction with significant bronchodilator response, normal TLC and moderate diffusion defect.   He has completed an overnight oximetry test at [facility] but we do not have the report yet.   Past Medical History:  Diagnosis Date   ANXIETY 07/23/2008   COLONIC POLYPS, HX OF 03/26/2006   MULTIPLE FRAGMENTS OF TUBULAR ADENOMAS POLYPS   COPD (chronic obstructive pulmonary disease) ([place])    Diverticulosis    Family history of colon cancer    Father    GERD 07/23/2008   Hepatitis C    HYPERLIPIDEMIA 07/23/2008   HYPERTENSION 07/23/2008   TENDINITIS, LEFT THUMB 07/15/2010     Family History  Problem Relation Age of Onset   Colon cancer Father 60   Hypertension Father    Diabetes Father    Heart attack Father    Colon polyps Neg Hx    Esophageal cancer Neg Hx    Rectal cancer Neg Hx    Stomach cancer Neg Hx      Social History   Socioeconomic History   Marital status: Widowed    Spouse name: Not on file   Number of children: 0   Years of education: Not on file   Highest education level: Not on file  Occupational History    Employer: [employer]  Tobacco Use   Smoking status: Every Day    Packs/day: 1.50    Years: 35.00    Pack years: 52.50    Types: Cigarettes    Smokeless tobacco: Never   Tobacco comments:    Started smoking at age 67.  Currently smoking 1 1/2 ppd.  Vaping Use   Vaping Use: Never used  Substance and Sexual Activity   Alcohol use: No   Drug use: No   Sexual activity: Not on file  Other Topics Concern   Not on file  Social History Narrative   Not on file   Social Determinants of Health   Financial Resource Strain: Low Risk    Difficulty of Paying Living Expenses: Not hard at all  Food Insecurity: No Food Insecurity   Worried About Charity fundraiser in the Last Year: Never true   [place] in the Last Year: Never true  Transportation Needs: No Transportation Needs   Lack of Transportation (Medical): No   Lack of Transportation (Non-Medical): No  Physical Activity: Inactive   Days of Exercise per Week: 0 days   Minutes of Exercise per Session: 0 min  Stress: No Stress Concern Present   Feeling of Stress : Not at all  Social Connections: Socially Isolated   Frequency of Communication with Friends and Family: More than three times a week   Frequency of Social Gatherings with Friends and Family: More than three times a week  Attends Religious Services: Never   Active Member of Clubs or Organizations: No   Attends Archivist Meetings: Never   Marital Status: Widowed  Human resources officer Violence: Not At Risk   Fear of Current or Ex-Partner: No   Emotionally Abused: No   Physically Abused: No   Sexually Abused: No     No Known Allergies   Outpatient Medications Prior to Visit  Medication Sig Dispense Refill   albuterol (PROVENTIL) (2.5 MG/3ML) 0.083% nebulizer solution Take 3 mLs (2.5 mg total) by nebulization every 6 (six) hours as needed for wheezing or shortness of breath. Dx code: J44.9 75 mL 11   albuterol (VENTOLIN HFA) 108 (90 Base) MCG/ACT inhaler INHALE 1 TO 2 PUFFS BY MOUTH EVERY 6 HOURS AS NEEDED 8 g 2   amLODipine (NORVASC) 5 MG tablet Take 1 tablet by mouth once daily 90 tablet 0    atorvastatin (LIPITOR) 40 MG tablet Take 1 tablet by mouth once daily 90 tablet 0   citalopram (CELEXA) 40 MG tablet Take 1 tablet by mouth once daily 90 tablet 0   Fluticasone-Umeclidin-Vilant (TRELEGY ELLIPTA) 100-62.5-25 MCG/INH AEPB Inhale 1 puff by mouth once daily 60 each 5   ibuprofen (ADVIL) 200 MG tablet Take 400 mg by mouth every 6 (six) hours as needed.     ketoconazole (NIZORAL) 2 % cream Apply 1 application topically daily. 15 g 0   LORazepam (ATIVAN) 0.5 MG tablet Take 1 tablet by mouth twice daily 60 tablet 0   losartan (COZAAR) 50 MG tablet Take 1 tablet by mouth once daily 90 tablet 0   omeprazole (PRILOSEC) 40 MG capsule Take 1 capsule (40 mg total) by mouth daily. 90 capsule 3   tamsulosin (FLOMAX) 0.4 MG CAPS capsule Take 1 capsule (0.4 mg total) by mouth daily. 90 capsule 3   No facility-administered medications prior to visit.    Review of Systems  Constitutional:  Negative for chills, fever and weight loss.  HENT:  Negative for congestion, sinus pain and sore throat.   Eyes: Negative.   Respiratory:  Positive for cough, sputum production, shortness of breath and wheezing. Negative for hemoptysis.   Cardiovascular:  Negative for chest pain and leg swelling.  Gastrointestinal:  Negative for abdominal pain, heartburn and nausea.  Genitourinary: Negative.   Musculoskeletal: Negative.   Neurological:  Negative for dizziness, weakness and headaches.  Endo/Heme/Allergies: Negative.   Psychiatric/Behavioral: Negative.     Objective:   There were no vitals filed for this visit.    Physical Exam Constitutional:      General: He is not in acute distress.    Appearance: He is obese. He is not ill-appearing.  HENT:     Head: Normocephalic and atraumatic.     Nose: Nose normal.     Mouth/Throat:     Mouth: Mucous membranes are moist.     Pharynx: Oropharynx is clear.  Eyes:     General: No scleral icterus.    Conjunctiva/sclera: Conjunctivae normal.   Cardiovascular:     Pulses: Normal pulses.     Heart sounds: Normal heart sounds. No murmur heard. Pulmonary:     Effort: No accessory muscle usage or prolonged expiration.     Breath sounds: Decreased air movement present. Decreased breath sounds and rhonchi present. No wheezing or rales.  Abdominal:     General: Bowel sounds are normal.     Palpations: Abdomen is soft.  Musculoskeletal:     Cervical back: Neck supple.  Right lower leg: No edema.     Left lower leg: No edema.  Skin:    Capillary Refill: Capillary refill takes less than 2 seconds.  Neurological:     General: No focal deficit present.     Mental Status: He is alert.     Gait: Gait normal.  Psychiatric:        Mood and Affect: Mood normal.        Behavior: Behavior normal.        Thought Content: Thought content normal.        Judgment: Judgment normal.   CBC    Component Value Date/Time   WBC 11.3 (H) 11/08/2020 1131   RBC 5.52 11/08/2020 1131   HGB 15.5 11/08/2020 1131   HCT 46.5 11/08/2020 1131   PLT 188.0 11/08/2020 1131   MCV 84.1 11/08/2020 1131   MCHC 33.3 11/08/2020 1131   RDW 15.3 11/08/2020 1131   LYMPHSABS 2.2 11/08/2020 1131   MONOABS 0.6 11/08/2020 1131   EOSABS 0.1 11/08/2020 1131   BASOSABS 0.1 11/08/2020 1131   BMP Latest Ref Rng & Units 02/17/2021 11/08/2020 05/26/2020  Glucose 70 - 99 mg/dL 93 76 93  BUN 6 - 23 mg/dL 12 13 19   Creatinine 0.40 - 1.50 mg/dL 0.99 1.06 1.02  Sodium 135 - 145 mEq/L 139 138 137  Potassium 3.5 - 5.1 mEq/L 3.9 3.9 4.1  Chloride 96 - 112 mEq/L 101 100 101  CO2 19 - 32 mEq/L 30 29 29   Calcium 8.4 - 10.5 mg/dL 8.8 8.8 8.8     Chest imaging: CT Chest 01/19/2021 Mediastinum/Nodes: No mediastinal lymphadenopathy. No evidence for gross hilar lymphadenopathy although assessment is limited by the lack of intravenous contrast on today's study. Tiny hiatal hernia. The esophagus has normal imaging features. There is no axillary lymphadenopathy.    Lungs/Pleura: Centrilobular emphsyema noted. Scattered solid and non solid bilateral pulmonary nodules are identified. No overtly suspicious nodule or mass. No focal airspace consolidation. No pleural effusion.  CXR 10/2018 There is no edema or consolidation. The heart size and pulmonary vascularity are normal. No adenopathy. No evident bone lesions.  PFT: PFT Results Latest Ref Rng & Units 08/05/2021 04/12/2017  FVC-Pre L 2.88 2.43  FVC-Predicted Pre % 64 52  FVC-Post L 3.12 2.85  FVC-Predicted Post % 69 61  Pre FEV1/FVC % % 55 62  Post FEV1/FCV % % 56 61  FEV1-Pre L 1.57 1.49  FEV1-Predicted Pre % 46 42  FEV1-Post L 1.75 1.75  DLCO uncorrected ml/min/mmHg 11.54 12.29  DLCO UNC% % 44 39  DLCO corrected ml/min/mmHg 11.54 11.82  DLCO COR %Predicted % 44 38  DLVA Predicted % 59 61  TLC L 6.84 7.02  TLC % Predicted % 100 103  RV % Predicted % 155 214    PFT 2022: Moderate severe obstruction, significant bronchodilator response  Assessment & Plan:   No diagnosis found.  Discussion: Virginia Francisco is a 65 year old male, daily smoker with history of COPD who returns to pulmonary clinic for follow up.   His breathing appears at baseline but he continues to have significant cough with sputum production daily along with rhonchi on exam today.   He is to continue on trelegy ellipta 1 puff daily and albuterol inhaler as needed.   We will start him on azithromycin prophylaxis therapy for his frequent COPD exacerbations. We will check EKG today for QT monitoring,  Qtc is 470. He is to take azithromycin 500mg  three times per week.  Again, I have strongly encouraged him to reduce his smoking and to stop all together. He continues to smoke 2 packs per day. I encouraged him to use nicotine patches in order to cut back further but he continues to express reluctance in smoking cessation methods.   He is enrolled in our lung cancer screening program. Last scan was 01/19/21, lung-rads  2.  Awaiting overnight oximetry results from [facility].    Patient to follow up in 3 months.  Freda Jackson, MD [facility] Office: [phone]     Current Outpatient Medications:    albuterol (PROVENTIL) (2.5 MG/3ML) 0.083% nebulizer solution, Take 3 mLs (2.5 mg total) by nebulization every 6 (six) hours as needed for wheezing or shortness of breath. Dx code: J44.9, Disp: 75 mL, Rfl: 11   albuterol (VENTOLIN HFA) 108 (90 Base) MCG/ACT inhaler, INHALE 1 TO 2 PUFFS BY MOUTH EVERY 6 HOURS AS NEEDED, Disp: 8 g, Rfl: 2   amLODipine (NORVASC) 5 MG tablet, Take 1 tablet by mouth once daily, Disp: 90 tablet, Rfl: 0   atorvastatin (LIPITOR) 40 MG tablet, Take 1 tablet by mouth once daily, Disp: 90 tablet, Rfl: 0   citalopram (CELEXA) 40 MG tablet, Take 1 tablet by mouth once daily, Disp: 90 tablet, Rfl: 0   Fluticasone-Umeclidin-Vilant (TRELEGY ELLIPTA) 100-62.5-25 MCG/INH AEPB, Inhale 1 puff by mouth once daily, Disp: 60 each, Rfl: 5   ibuprofen (ADVIL) 200 MG tablet, Take 400 mg by mouth every 6 (six) hours as needed., Disp: , Rfl:    ketoconazole (NIZORAL) 2 % cream, Apply 1 application topically daily., Disp: 15 g, Rfl: 0   LORazepam (ATIVAN) 0.5 MG tablet, Take 1 tablet by mouth twice daily, Disp: 60 tablet, Rfl: 0   losartan (COZAAR) 50 MG tablet, Take 1 tablet by mouth once daily, Disp: 90 tablet, Rfl: 0   omeprazole (PRILOSEC) 40 MG capsule, Take 1 capsule (40 mg total) by mouth daily., Disp: 90 capsule, Rfl: 3   tamsulosin (FLOMAX) 0.4 MG CAPS capsule, Take 1 capsule (0.4 mg total) by mouth daily., Disp: 90 capsule, Rfl: 3

## 2021-11-11 ENCOUNTER — Ambulatory Visit: Payer: PPO | Admitting: Pulmonary Disease

## 2021-11-28 ENCOUNTER — Other Ambulatory Visit: Payer: Self-pay | Admitting: Medical

## 2021-12-05 ENCOUNTER — Other Ambulatory Visit: Payer: Self-pay | Admitting: Medical

## 2021-12-05 NOTE — Telephone Encounter (Addendum)
Requesting: ativan Contract:09/26/21 UDS:09/19/21 Last Visit:09/19/21 Next Visit:01/19/22 Last Refill:10/31/21  Please Advise   I refilled pt ativan. Have him follow up with me  in January.  Thanks,

## 2022-01-03 ENCOUNTER — Other Ambulatory Visit: Payer: Self-pay | Admitting: Medical

## 2022-01-03 NOTE — Telephone Encounter (Addendum)
Requesting: lorazepam 0.5mg   Contract:  09/19/2021 UDS: 09/19/2021 Last Visit: 09/19/2021 Next Visit: 01/19/2022 Last Refill: 12/05/2021 #60 and 0RF  Please Advise  Rx refill sent to pt pharmacy.   Mackie Pai, PA-C

## 2022-01-19 ENCOUNTER — Ambulatory Visit: Payer: PPO | Admitting: Medical

## 2022-01-23 ENCOUNTER — Other Ambulatory Visit: Payer: Self-pay | Admitting: Medical

## 2022-01-26 ENCOUNTER — Ambulatory Visit: Payer: PPO | Admitting: Medical

## 2022-02-03 ENCOUNTER — Other Ambulatory Visit: Payer: Self-pay | Admitting: Medical

## 2022-02-03 NOTE — Telephone Encounter (Addendum)
Requesting: ativan Contract:09/19/22 UDS:09/19/22 Last Visit:09/19/22 Next Visit:02/06/22 Last Refill:01/03/22  Please Advise   Rx refill sent to pt pharmacy.  Mackie Pai, PA-C

## 2022-02-06 ENCOUNTER — Ambulatory Visit: Payer: Medicare PPO | Admitting: Medical

## 2022-02-06 VITALS — BP 130/78 | HR 70 | Temp 98.5°F | Resp 16 | Ht 69.0 in | Wt 207.0 lb

## 2022-02-06 DIAGNOSIS — F17200 Nicotine dependence, unspecified, uncomplicated: Secondary | ICD-10-CM

## 2022-02-06 DIAGNOSIS — I10 Essential (primary) hypertension: Secondary | ICD-10-CM | POA: Diagnosis not present

## 2022-02-06 DIAGNOSIS — R972 Elevated prostate specific antigen [PSA]: Secondary | ICD-10-CM | POA: Diagnosis not present

## 2022-02-06 DIAGNOSIS — J441 Chronic obstructive pulmonary disease with (acute) exacerbation: Secondary | ICD-10-CM | POA: Diagnosis not present

## 2022-02-06 DIAGNOSIS — F3289 Other specified depressive episodes: Secondary | ICD-10-CM

## 2022-02-06 DIAGNOSIS — E785 Hyperlipidemia, unspecified: Secondary | ICD-10-CM

## 2022-02-06 DIAGNOSIS — F419 Anxiety disorder, unspecified: Secondary | ICD-10-CM | POA: Diagnosis not present

## 2022-02-06 LAB — PSA: PSA: 4.81 ng/mL — ABNORMAL HIGH (ref 0.10–4.00)

## 2022-02-06 NOTE — Progress Notes (Addendum)
Subjective:    Patient ID: Trevor Mack, male    DOB: 09/24/1956, 66 y.o.   MRN: [id number]  HPI Pt anxiety is controlled with ativan. uds and contract up to date. [place] drug data base reviewed. Pt not  due for refill of ativan yet. Will update uds and contract today as he will need to do so in November.   Some mild depression. He is celexa and states mood is not bad.   Pt has copd. He is still on trelegy and sees pulmonologist. "Chronic obstructive lung disease with acute on chronic bronchitis and exacerbation gold stage C   Pt thinks he missed his novemnber appt with pulmonologist.   Htn- pt bp well controlled today. He is on losartan 50 mg daily and amlodipine 5 mg daily.   High cholesterol on atorvastatin.  Pt has elevated prostate protein. I referred to urologist. He states did go to appt and was roomed. Then the urologist never came in to room. Pt did get explanation. Staff told MD was not in office so pt left. Pt still frequently urinating about every 2-3 hours. Pt waited about one hour at urolgist before he left.     Review of Systems  Constitutional:  Negative for chills, fatigue and fever.  HENT:  Negative for congestion and drooling.   Respiratory:  Negative for cough, chest tightness, shortness of breath and wheezing.   Cardiovascular:  Negative for chest pain and palpitations.  Gastrointestinal:  Negative for abdominal pain, blood in stool and nausea.  Genitourinary:  Positive for frequency. Negative for dysuria and flank pain.  Musculoskeletal:  Negative for back pain, myalgias and neck stiffness.  Skin:  Negative for rash.  Neurological:  Negative for dizziness and headaches.  Hematological:  Negative for adenopathy. Does not bruise/bleed easily.  Psychiatric/Behavioral:  Negative for behavioral problems, decreased concentration, dysphoric mood and hallucinations. The patient is not nervous/anxious.        Mood and anxiety controlled.    Past Medical History:   Diagnosis Date   ANXIETY 07/23/2008   COLONIC POLYPS, HX OF 03/26/2006   MULTIPLE FRAGMENTS OF TUBULAR ADENOMAS POLYPS   COPD (chronic obstructive pulmonary disease) ([place])    Diverticulosis    Family history of colon cancer    Father    GERD 07/23/2008   Hepatitis C    HYPERLIPIDEMIA 07/23/2008   HYPERTENSION 07/23/2008   TENDINITIS, LEFT THUMB 07/15/2010     Social History   Socioeconomic History   Marital status: Widowed    Spouse name: Not on file   Number of children: 0   Years of education: Not on file   Highest education level: Not on file  Occupational History    Employer: [employer]  Tobacco Use   Smoking status: Every Day    Packs/day: 1.50    Years: 35.00    Pack years: 52.50    Types: Cigarettes   Smokeless tobacco: Never   Tobacco comments:    Started smoking at age 58.  Currently smoking 1 1/2 ppd.  Vaping Use   Vaping Use: Never used  Substance and Sexual Activity   Alcohol use: No   Drug use: No   Sexual activity: Not on file  Other Topics Concern   Not on file  Social History Narrative   Not on file   Social Determinants of Health   Financial Resource Strain: Low Risk    Difficulty of Paying Living Expenses: Not hard at all  Food Insecurity:  No Food Insecurity   Worried About Charity fundraiser in the Last Year: Never true   Ran Out of Food in the Last Year: Never true  Transportation Needs: No Transportation Needs   Lack of Transportation (Medical): No   Lack of Transportation (Non-Medical): No  Physical Activity: Inactive   Days of Exercise per Week: 0 days   Minutes of Exercise per Session: 0 min  Stress: No Stress Concern Present   Feeling of Stress : Not at all  Social Connections: Socially Isolated   Frequency of Communication with Friends and Family: More than three times a week   Frequency of Social Gatherings with Friends and Family: More than three times a week   Attends Religious Services: Never   Corporate treasurer or Organizations: No   Attends Archivist Meetings: Never   Marital Status: Widowed  Intimate Partner Violence: Not At Risk   Fear of Current or Ex-Partner: No   Emotionally Abused: No   Physically Abused: No   Sexually Abused: No    Past Surgical History:  Procedure Laterality Date   COLONOSCOPY  11/27/2012   LUMBAR LAMINECTOMY      Family History  Problem Relation Age of Onset   Colon cancer Father 8   Hypertension Father    Diabetes Father    Heart attack Father    Colon polyps Neg Hx    Esophageal cancer Neg Hx    Rectal cancer Neg Hx    Stomach cancer Neg Hx     No Known Allergies  Current Outpatient Medications on File Prior to Visit  Medication Sig Dispense Refill   albuterol (PROVENTIL) (2.5 MG/3ML) 0.083% nebulizer solution Take 3 mLs (2.5 mg total) by nebulization every 6 (six) hours as needed for wheezing or shortness of breath. Dx code: J44.9 75 mL 11   albuterol (VENTOLIN HFA) 108 (90 Base) MCG/ACT inhaler INHALE 1 TO 2 PUFFS BY MOUTH EVERY 6 HOURS AS NEEDED 8 g 2   amLODipine (NORVASC) 5 MG tablet Take 1 tablet by mouth once daily 90 tablet 0   atorvastatin (LIPITOR) 40 MG tablet Take 1 tablet by mouth once daily 90 tablet 0   citalopram (CELEXA) 40 MG tablet Take 1 tablet by mouth once daily 90 tablet 0   Fluticasone-Umeclidin-Vilant (TRELEGY ELLIPTA) 100-62.5-25 MCG/INH AEPB Inhale 1 puff by mouth once daily 60 each 5   ibuprofen (ADVIL) 200 MG tablet Take 400 mg by mouth every 6 (six) hours as needed.     ketoconazole (NIZORAL) 2 % cream Apply 1 application topically daily. 15 g 0   LORazepam (ATIVAN) 0.5 MG tablet Take 1 tablet by mouth twice daily 60 tablet 0   losartan (COZAAR) 50 MG tablet Take 1 tablet by mouth once daily 90 tablet 0   omeprazole (PRILOSEC) 40 MG capsule Take 1 capsule (40 mg total) by mouth daily. 90 capsule 3   tamsulosin (FLOMAX) 0.4 MG CAPS capsule Take 1 capsule (0.4 mg total) by mouth daily. 90 capsule 3   No  current facility-administered medications on file prior to visit.    BP 130/78 (BP Location: Right Arm, Patient Position: Sitting, Cuff Size: Normal)    Pulse 70    Temp 98.5 F (36.9 C) (Oral)    Resp 16    Ht 5\' 9"  (1.753 m)    Wt 207 lb (93.9 kg)    SpO2 95%    BMI 30.57 kg/m       Objective:  Physical Exam  General Mental Status- Alert. General Appearance- Not in acute distress.   Skin General: Color- Normal Color. Moisture- Normal Moisture.  Neck Carotid Arteries- Normal color. Moisture- Normal Moisture. No carotid bruits. No JVD.  Chest and Lung Exam Auscultation: Breath Sounds:-Normal.  Cardiovascular Auscultation:Rythm- Regular. Murmurs & Other Heart Sounds:Auscultation of the heart reveals- No Murmurs.  Abdomen Inspection:-Inspeection Normal. Palpation/Percussion:Note:No mass. Palpation and Percussion of the abdomen reveal- Non Tender, Non Distended + BS, no rebound or guarding.   Neurologic Cranial Nerve exam:- CN III-XII intact(No nystagmus), symmetric smile. Strength:- 5/5 equal and symmetric strength both upper and lower extremities.       Assessment & Plan:   Patient Instructions  Anxiety and depression. More anxiety and presently both controlled. Continue celexa and ativan. Up to date on contract and uds.   Copd stable. Continue trelegy and please call your pulmonologist to make follow up appt.  Hyperlipidemia. Get cmp and lipid panel today. Continue atorvastatin.  Htn- bp controlled. Continue losartan 50 mg daily and amlodipine 5 mg daily.   Elevated psa. Will repeat that level today. And will very likely refer you again to urologist as you last value was elevated.   Counseled on quitting smoking.    Follow up date to be determined after lab work.     Mackie Pai, PA-C   Note sent to supervising DO to review since prescribing controlled med.

## 2022-02-06 NOTE — Patient Instructions (Addendum)
Anxiety and depression. More anxiety and presently both controlled. Continue celexa and ativan. Up to date on contract and uds.   Copd stable. Continue trelegy and please call your pulmonologist to make follow up appt.  Hyperlipidemia. Get cmp and lipid panel today. Continue atorvastatin.  Htn- bp controlled. Continue losartan 50 mg daily and amlodipine 5 mg daily.   Elevated psa. Will repeat that level today. And will very likely refer you again to urologist as you last value was elevated.   Counseled on quitting smoking.    Follow up date to be determined after lab work.

## 2022-02-07 LAB — COMPREHENSIVE METABOLIC PANEL
ALT: 18 U/L (ref 0–53)
AST: 15 U/L (ref 0–37)
Albumin: 4.5 g/dL (ref 3.5–5.2)
Alkaline Phosphatase: 125 U/L — ABNORMAL HIGH (ref 39–117)
BUN: 11 mg/dL (ref 6–23)
CO2: 30 mEq/L (ref 19–32)
Calcium: 9.1 mg/dL (ref 8.4–10.5)
Chloride: 102 mEq/L (ref 96–112)
Creatinine, Ser: 1.13 mg/dL (ref 0.40–1.50)
GFR: 68.55 mL/min (ref >60.00)
Glucose, Bld: 79 mg/dL (ref 70–99)
Potassium: 3.7 mEq/L (ref 3.5–5.1)
Sodium: 140 mEq/L (ref 135–145)
Total Bilirubin: 0.4 mg/dL (ref 0.2–1.2)
Total Protein: 7.2 g/dL (ref 6.0–8.3)

## 2022-02-07 LAB — LIPID PANEL
Cholesterol: 133 mg/dL (ref 0–200)
HDL: 28.9 mg/dL — ABNORMAL LOW (ref >39.00)
LDL Cholesterol: 75 mg/dL (ref 0–99)
NonHDL: 104.44
Total CHOL/HDL Ratio: 5
Triglycerides: 147 mg/dL (ref 0.0–149.0)
VLDL: 29.4 mg/dL (ref 0.0–40.0)

## 2022-02-09 NOTE — Addendum Note (Signed)
Addended by: Anabel Halon on: 02/09/2022 04:07 PM   Modules accepted: Orders

## 2022-02-20 ENCOUNTER — Other Ambulatory Visit: Payer: Self-pay | Admitting: Pulmonary Disease

## 2022-02-22 ENCOUNTER — Other Ambulatory Visit: Payer: Self-pay

## 2022-02-22 ENCOUNTER — Encounter: Payer: Self-pay | Admitting: Pulmonary Disease

## 2022-02-22 ENCOUNTER — Ambulatory Visit: Payer: Medicare PPO | Admitting: Pulmonary Disease

## 2022-02-22 VITALS — BP 124/82 | HR 98 | Ht 69.0 in | Wt 211.2 lb

## 2022-02-22 DIAGNOSIS — J411 Mucopurulent chronic bronchitis: Secondary | ICD-10-CM

## 2022-02-22 DIAGNOSIS — F17210 Nicotine dependence, cigarettes, uncomplicated: Secondary | ICD-10-CM | POA: Diagnosis not present

## 2022-02-22 NOTE — Patient Instructions (Addendum)
We will check a simple walk to day to see if you qualify for oxygen.  ? ?Continue trelegy ellipta 1 puff daily. ? ?Continue albuterol as needed 1-2 puffs every 4-6 hours.  ? ?Recommend smoking cessation with nicotine patches 21mg  daily and mini nicotine lozenges as needed.  ? ?We will reach out to our lung cancer screening team in regards to your next screening scan. ? ?Follow up in 6 months. ?

## 2022-02-22 NOTE — Progress Notes (Signed)
Synopsis: Follow up for COPD  Subjective:   PATIENT ID: Trevor Mack GENDER: male DOB: 02-28-1956, MRN: [id number]  HPI  Chief Complaint  Patient presents with   Follow-up    6+ month f/u for COPD. States he has been doing well since last visit.    Trevor Mack is a 66 year old male, daily smoker with history of COPD who returns to pulmonary clinic for follow up of COPD.  His breathing is doing ok. He remains on trelegy ellipta 100, 1 puff daily and is using albuterol 4-6 times per day. He continues to smoke nearly 2 packs per day. He did not tolerate azithromycin therapy as it upset his stomach.  PFTs show moderately severe obstruction with significant bronchodilator response, normal TLC and moderate diffusion defect.   He has completed an overnight oximetry test at [facility] but we do not have the report yet.   Past Medical History:  Diagnosis Date   ANXIETY 07/23/2008   COLONIC POLYPS, HX OF 03/26/2006   MULTIPLE FRAGMENTS OF TUBULAR ADENOMAS POLYPS   COPD (chronic obstructive pulmonary disease) ([place])    Diverticulosis    Family history of colon cancer    Father    GERD 07/23/2008   Hepatitis C    HYPERLIPIDEMIA 07/23/2008   HYPERTENSION 07/23/2008   TENDINITIS, LEFT THUMB 07/15/2010     Family History  Problem Relation Age of Onset   Colon cancer Father 57   Hypertension Father    Diabetes Father    Heart attack Father    Colon polyps Neg Hx    Esophageal cancer Neg Hx    Rectal cancer Neg Hx    Stomach cancer Neg Hx      Social History   Socioeconomic History   Marital status: Widowed    Spouse name: Not on file   Number of children: 0   Years of education: Not on file   Highest education level: Not on file  Occupational History    Employer: [employer]  Tobacco Use   Smoking status: Every Day    Packs/day: 1.50    Years: 35.00    Pack years: 52.50    Types: Cigarettes   Smokeless tobacco: Never   Tobacco comments:    Started smoking at  age 76.  Currently smoking 1 1/2 ppd.  Vaping Use   Vaping Use: Never used  Substance and Sexual Activity   Alcohol use: No   Drug use: No   Sexual activity: Not on file  Other Topics Concern   Not on file  Social History Narrative   Not on file   Social Determinants of Health   Financial Resource Strain: Low Risk    Difficulty of Paying Living Expenses: Not hard at all  Food Insecurity: No Food Insecurity   Worried About Charity fundraiser in the Last Year: Never true   [place] in the Last Year: Never true  Transportation Needs: No Transportation Needs   Lack of Transportation (Medical): No   Lack of Transportation (Non-Medical): No  Physical Activity: Inactive   Days of Exercise per Week: 0 days   Minutes of Exercise per Session: 0 min  Stress: No Stress Concern Present   Feeling of Stress : Not at all  Social Connections: Socially Isolated   Frequency of Communication with Friends and Family: More than three times a week   Frequency of Social Gatherings with Friends and Family: More than three times a week  Attends Religious Services: Never   Active Member of Clubs or Organizations: No   Attends Archivist Meetings: Never   Marital Status: Widowed  Human resources officer Violence: Not At Risk   Fear of Current or Ex-Partner: No   Emotionally Abused: No   Physically Abused: No   Sexually Abused: No     No Known Allergies   Outpatient Medications Prior to Visit  Medication Sig Dispense Refill   albuterol (PROVENTIL) (2.5 MG/3ML) 0.083% nebulizer solution Take 3 mLs (2.5 mg total) by nebulization every 6 (six) hours as needed for wheezing or shortness of breath. Dx code: J44.9 75 mL 11   albuterol (VENTOLIN HFA) 108 (90 Base) MCG/ACT inhaler INHALE 1 TO 2 PUFFS BY MOUTH EVERY 6 HOURS AS NEEDED 8 g 2   amLODipine (NORVASC) 5 MG tablet Take 1 tablet by mouth once daily 90 tablet 0   atorvastatin (LIPITOR) 40 MG tablet Take 1 tablet by mouth once daily 90  tablet 0   citalopram (CELEXA) 40 MG tablet Take 1 tablet by mouth once daily 90 tablet 0   ibuprofen (ADVIL) 200 MG tablet Take 400 mg by mouth every 6 (six) hours as needed.     ketoconazole (NIZORAL) 2 % cream Apply 1 application topically daily. 15 g 0   LORazepam (ATIVAN) 0.5 MG tablet Take 1 tablet by mouth twice daily 60 tablet 0   losartan (COZAAR) 50 MG tablet Take 1 tablet by mouth once daily 90 tablet 0   omeprazole (PRILOSEC) 40 MG capsule Take 1 capsule (40 mg total) by mouth daily. 90 capsule 3   tamsulosin (FLOMAX) 0.4 MG CAPS capsule Take 1 capsule (0.4 mg total) by mouth daily. 90 capsule 3   TRELEGY ELLIPTA 100-62.5-25 MCG/ACT AEPB INHALE 1 PUFF ONCE DAILY 60 each 5   No facility-administered medications prior to visit.    Review of Systems  Constitutional:  Negative for chills, fever and weight loss.  HENT:  Negative for congestion, sinus pain and sore throat.   Eyes: Negative.   Respiratory:  Positive for cough, sputum production, shortness of breath and wheezing. Negative for hemoptysis.   Cardiovascular:  Negative for chest pain and leg swelling.  Gastrointestinal:  Negative for abdominal pain, heartburn and nausea.  Genitourinary: Negative.   Musculoskeletal: Negative.   Neurological:  Negative for dizziness, weakness and headaches.  Endo/Heme/Allergies: Negative.   Psychiatric/Behavioral: Negative.     Objective:   Vitals:   02/22/22 1115  BP: 124/82  Pulse: 98  SpO2: 97%  Weight: 211 lb 3.2 oz (95.8 kg)  Height: 5\' 9"  (1.753 m)      Physical Exam Constitutional:      General: He is not in acute distress.    Appearance: He is obese. He is not ill-appearing.  HENT:     Head: Normocephalic and atraumatic.  Eyes:     General: No scleral icterus.    Conjunctiva/sclera: Conjunctivae normal.  Cardiovascular:     Pulses: Normal pulses.     Heart sounds: Normal heart sounds. No murmur heard. Pulmonary:     Effort: No accessory muscle usage or  prolonged expiration.     Breath sounds: Decreased air movement present. Decreased breath sounds present. No wheezing, rhonchi or rales.  Musculoskeletal:     Cervical back: Neck supple.     Right lower leg: No edema.     Left lower leg: No edema.  Skin:    Capillary Refill: Capillary refill takes less than 2 seconds.  Neurological:     General: No focal deficit present.     Mental Status: He is alert.     Gait: Gait normal.  Psychiatric:        Mood and Affect: Mood normal.        Behavior: Behavior normal.        Thought Content: Thought content normal.        Judgment: Judgment normal.   CBC    Component Value Date/Time   WBC 11.3 (H) 11/08/2020 1131   RBC 5.52 11/08/2020 1131   HGB 15.5 11/08/2020 1131   HCT 46.5 11/08/2020 1131   PLT 188.0 11/08/2020 1131   MCV 84.1 11/08/2020 1131   MCHC 33.3 11/08/2020 1131   RDW 15.3 11/08/2020 1131   LYMPHSABS 2.2 11/08/2020 1131   MONOABS 0.6 11/08/2020 1131   EOSABS 0.1 11/08/2020 1131   BASOSABS 0.1 11/08/2020 1131   BMP Latest Ref Rng & Units 02/06/2022 02/17/2021 11/08/2020  Glucose 70 - 99 mg/dL 79 93 76  BUN 6 - 23 mg/dL 11 12 13   Creatinine 0.40 - 1.50 mg/dL 1.13 0.99 1.06  Sodium 135 - 145 mEq/L 140 139 138  Potassium 3.5 - 5.1 mEq/L 3.7 3.9 3.9  Chloride 96 - 112 mEq/L 102 101 100  CO2 19 - 32 mEq/L 30 30 29   Calcium 8.4 - 10.5 mg/dL 9.1 8.8 8.8   Chest imaging: CT Chest 01/19/2021 Mediastinum/Nodes: No mediastinal lymphadenopathy. No evidence for gross hilar lymphadenopathy although assessment is limited by the lack of intravenous contrast on today's study. Tiny hiatal hernia. The esophagus has normal imaging features. There is no axillary lymphadenopathy.   Lungs/Pleura: Centrilobular emphsyema noted. Scattered solid and non solid bilateral pulmonary nodules are identified. No overtly suspicious nodule or mass. No focal airspace consolidation. No pleural effusion.  CXR 10/2018 There is no edema or  consolidation. The heart size and pulmonary vascularity are normal. No adenopathy. No evident bone lesions.  PFT: PFT Results Latest Ref Rng & Units 08/05/2021 04/12/2017  FVC-Pre L 2.88 2.43  FVC-Predicted Pre % 64 52  FVC-Post L 3.12 2.85  FVC-Predicted Post % 69 61  Pre FEV1/FVC % % 55 62  Post FEV1/FCV % % 56 61  FEV1-Pre L 1.57 1.49  FEV1-Predicted Pre % 46 42  FEV1-Post L 1.75 1.75  DLCO uncorrected ml/min/mmHg 11.54 12.29  DLCO UNC% % 44 39  DLCO corrected ml/min/mmHg 11.54 11.82  DLCO COR %Predicted % 44 38  DLVA Predicted % 59 61  TLC L 6.84 7.02  TLC % Predicted % 100 103  RV % Predicted % 155 214    PFT 2022: Moderate severe obstruction, significant bronchodilator response  Assessment & Plan:   Mucopurulent chronic bronchitis (HCC)  Cigarette smoker - Plan: CANCELED: Ambulatory Referral for Lung Cancer Scre  Discussion: Trevor Mack is a 66 year old male, daily smoker with history of COPD who returns to pulmonary clinic for follow up.   His breathing appears at baseline but he continues to have significant cough with sputum production daily and significant exertional dyspnea.   We will check a simple walk today and determine if he qualifies for oxygen. We will request his ONO results from [facility].   He is to continue on trelegy ellipta 1 puff daily and albuterol inhaler as needed. Refills sent to pharmacy.   Patient to follow up in 6 months.  Trevor Jackson, MD [facility] Office: [phone]     Current Outpatient Medications:  albuterol (PROVENTIL) (2.5 MG/3ML) 0.083% nebulizer solution, Take 3 mLs (2.5 mg total) by nebulization every 6 (six) hours as needed for wheezing or shortness of breath. Dx code: J44.9, Disp: 75 mL, Rfl: 11   albuterol (VENTOLIN HFA) 108 (90 Base) MCG/ACT inhaler, INHALE 1 TO 2 PUFFS BY MOUTH EVERY 6 HOURS AS NEEDED, Disp: 8 g, Rfl: 2   amLODipine (NORVASC) 5 MG tablet, Take 1 tablet by mouth once daily,  Disp: 90 tablet, Rfl: 0   atorvastatin (LIPITOR) 40 MG tablet, Take 1 tablet by mouth once daily, Disp: 90 tablet, Rfl: 0   citalopram (CELEXA) 40 MG tablet, Take 1 tablet by mouth once daily, Disp: 90 tablet, Rfl: 0   ibuprofen (ADVIL) 200 MG tablet, Take 400 mg by mouth every 6 (six) hours as needed., Disp: , Rfl:    ketoconazole (NIZORAL) 2 % cream, Apply 1 application topically daily., Disp: 15 g, Rfl: 0   LORazepam (ATIVAN) 0.5 MG tablet, Take 1 tablet by mouth twice daily, Disp: 60 tablet, Rfl: 0   losartan (COZAAR) 50 MG tablet, Take 1 tablet by mouth once daily, Disp: 90 tablet, Rfl: 0   omeprazole (PRILOSEC) 40 MG capsule, Take 1 capsule (40 mg total) by mouth daily., Disp: 90 capsule, Rfl: 3   tamsulosin (FLOMAX) 0.4 MG CAPS capsule, Take 1 capsule (0.4 mg total) by mouth daily., Disp: 90 capsule, Rfl: 3   TRELEGY ELLIPTA 100-62.5-25 MCG/ACT AEPB, INHALE 1 PUFF ONCE DAILY, Disp: 60 each, Rfl: 5

## 2022-02-23 ENCOUNTER — Telehealth: Payer: Self-pay | Admitting: Acute Care

## 2022-02-23 NOTE — Telephone Encounter (Signed)
Left message for pt to call to schedule f/u lung screening CT scan.  ?

## 2022-02-28 ENCOUNTER — Other Ambulatory Visit: Payer: Self-pay | Admitting: Medical

## 2022-03-06 ENCOUNTER — Other Ambulatory Visit: Payer: Self-pay | Admitting: Medical

## 2022-03-06 NOTE — Telephone Encounter (Addendum)
Requesting: lorazepam 0.'5mg'$   ?Contract: 09/19/2021 ?UDS: 09/19/2021 ?Last Visit: 02/06/2022 ?Next Visit: None ?Last Refill: 02/03/2022 #60 and 0RF ? ?Please Advise ? ?Rx refill sent to pharmacy. ? ?Mackie Pai, PA-C  ? ?

## 2022-03-27 ENCOUNTER — Other Ambulatory Visit: Payer: Self-pay | Admitting: Pulmonary Disease

## 2022-04-05 ENCOUNTER — Other Ambulatory Visit: Payer: Self-pay | Admitting: Medical

## 2022-04-05 NOTE — Telephone Encounter (Addendum)
Requesting: Ativan ?Contract: 09/19/21 ?UDS: 09/19/21 ?Last Visit: 02/06/22 ?Next Visit: none ?Last Refill: 03/06/2022 ? ?Please Advise ? ?Rx refill sent. ? ?Mackie Pai, PA-C  ?

## 2022-04-23 ENCOUNTER — Other Ambulatory Visit: Payer: Self-pay | Admitting: Medical

## 2022-04-24 ENCOUNTER — Other Ambulatory Visit: Payer: Self-pay | Admitting: Medical

## 2022-05-04 ENCOUNTER — Other Ambulatory Visit: Payer: Self-pay | Admitting: Medical

## 2022-05-04 NOTE — Telephone Encounter (Addendum)
Requesting: lorazepam 0.'5mg'$   ?Contract: 09/19/21 ?UDS: 09/19/21 ?Last Visit: 02/06/22 ?Next Visit: None ?Last Refill: 04/07/22 #60 and 0RF ? ?Please Advise ? ?Rx refilled today. ? ?Mackie Pai, PA-C  ? ?

## 2022-05-10 ENCOUNTER — Encounter: Payer: Self-pay | Admitting: Internal Medicine

## 2022-06-02 ENCOUNTER — Other Ambulatory Visit: Payer: Self-pay | Admitting: Medical

## 2022-06-05 ENCOUNTER — Other Ambulatory Visit: Payer: Self-pay | Admitting: Medical

## 2022-06-05 NOTE — Telephone Encounter (Addendum)
Requesting: lorazepam 0.'5mg'$   Contract: 09/19/21 UDS: 09/19/21 Last Visit: 02/06/22 Next Visit: 06/08/22 Last Refill: 05/04/22 #60 and 0RF  Please Advise  Rx refill sent to pharmacy.

## 2022-06-08 ENCOUNTER — Ambulatory Visit (INDEPENDENT_AMBULATORY_CARE_PROVIDER_SITE_OTHER): Payer: Medicare Other | Admitting: Medical

## 2022-06-08 VITALS — BP 120/70 | HR 100 | Resp 18 | Ht 69.0 in | Wt 204.8 lb

## 2022-06-08 DIAGNOSIS — E785 Hyperlipidemia, unspecified: Secondary | ICD-10-CM | POA: Diagnosis not present

## 2022-06-08 DIAGNOSIS — I10 Essential (primary) hypertension: Secondary | ICD-10-CM | POA: Diagnosis not present

## 2022-06-08 DIAGNOSIS — F419 Anxiety disorder, unspecified: Secondary | ICD-10-CM | POA: Diagnosis not present

## 2022-06-08 DIAGNOSIS — R972 Elevated prostate specific antigen [PSA]: Secondary | ICD-10-CM

## 2022-06-08 DIAGNOSIS — J441 Chronic obstructive pulmonary disease with (acute) exacerbation: Secondary | ICD-10-CM

## 2022-06-08 DIAGNOSIS — F3289 Other specified depressive episodes: Secondary | ICD-10-CM

## 2022-06-08 LAB — COMPREHENSIVE METABOLIC PANEL
ALT: 18 U/L (ref 0–53)
AST: 13 U/L (ref 0–37)
Albumin: 4.1 g/dL (ref 3.5–5.2)
Alkaline Phosphatase: 117 U/L (ref 39–117)
BUN: 14 mg/dL (ref 6–23)
CO2: 29 mEq/L (ref 19–32)
Calcium: 8.7 mg/dL (ref 8.4–10.5)
Chloride: 103 mEq/L (ref 96–112)
Creatinine, Ser: 1.05 mg/dL (ref 0.40–1.50)
GFR: 74.69 mL/min (ref >60.00)
Glucose, Bld: 84 mg/dL (ref 70–99)
Potassium: 3.9 mEq/L (ref 3.5–5.1)
Sodium: 141 mEq/L (ref 135–145)
Total Bilirubin: 0.8 mg/dL (ref 0.2–1.2)
Total Protein: 6.9 g/dL (ref 6.0–8.3)

## 2022-06-08 LAB — PSA: PSA: 4.79 ng/mL — ABNORMAL HIGH (ref 0.10–4.00)

## 2022-06-08 NOTE — Progress Notes (Signed)
Subjective:    Patient ID: Trevor Mack, male    DOB: [birth year], 66 y.o.   MRN: [id number]  HPI  Pt in for follow up.   Pt anxiety is controlled with ativan. uds and contract up to date. [place] drug data base reviewed. Pt not  due for refill of ativan yet(recent refilled). Contract and uds up to date.  Some mild depression. He is celexa and states mood is not bad.     Pt has copd. He is still on trelegy and sees pulmonologist. "Chronic obstructive lung disease with acute on chronic bronchitis and exacerbation gold stage C    Saw pulmonologist in March.    Htn- pt bp well controlled today. He is on losartan 50 mg daily and amlodipine 5 mg daily.    High cholesterol on atorvastatin.   Last visit note below in "   "Pt has elevated prostate protein. I referred to urologist. He states did go to appt and was roomed. Then the urologist never came in to room. Pt did get explanation. Staff told MD was not in office so pt left. Pt still frequently urinating about every 2-3 hours. Pt waited about one hour at urolgist before he left."  Pt states never got rescheduled. Will repeat psa today.   Review of Systems  Constitutional:  Negative for chills, fatigue and fever.  HENT:  Negative for congestion, ear pain, facial swelling, sinus pressure and sinus pain.   Respiratory:  Negative for cough, chest tightness, shortness of breath and wheezing.   Cardiovascular:  Negative for chest pain and palpitations.  Gastrointestinal:  Negative for abdominal pain and anal bleeding.  Genitourinary:  Negative for dysuria, flank pain and frequency.  Musculoskeletal:  Negative for back pain, joint swelling and myalgias.  Neurological:  Negative for dizziness, syncope, weakness, light-headedness, numbness and headaches.  Hematological:  Negative for adenopathy. Does not bruise/bleed easily.  Psychiatric/Behavioral:  Negative for behavioral problems and hallucinations. The patient is not nervous/anxious.         Controlled with ativan.    Past Medical History:  Diagnosis Date   ANXIETY 07/23/2008   COLONIC POLYPS, HX OF 03/26/2006   MULTIPLE FRAGMENTS OF TUBULAR ADENOMAS POLYPS   COPD (chronic obstructive pulmonary disease) ([place])    Diverticulosis    Family history of colon cancer    Father    GERD 07/23/2008   Hepatitis C    HYPERLIPIDEMIA 07/23/2008   HYPERTENSION 07/23/2008   TENDINITIS, LEFT THUMB 07/15/2010     Social History   Socioeconomic History   Marital status: Widowed    Spouse name: Not on file   Number of children: 0   Years of education: Not on file   Highest education level: Not on file  Occupational History    Employer: [employer]  Tobacco Use   Smoking status: Every Day    Packs/day: 1.50    Years: 35.00    Total pack years: 52.50    Types: Cigarettes   Smokeless tobacco: Never   Tobacco comments:    Started smoking at age 45.  Currently smoking 1 1/2 ppd.  Vaping Use   Vaping Use: Never used  Substance and Sexual Activity   Alcohol use: No   Drug use: No   Sexual activity: Not on file  Other Topics Concern   Not on file  Social History Narrative   Not on file   Social Determinants of Health   Financial Resource Strain: Low Risk  (08/24/2021)  Overall Financial Resource Strain (CARDIA)    Difficulty of Paying Living Expenses: Not hard at all  Food Insecurity: No Food Insecurity (08/24/2021)   Hunger Vital Sign    Worried About Running Out of Food in the Last Year: Never true    Ran Out of Food in the Last Year: Never true  Transportation Needs: No Transportation Needs (08/24/2021)   PRAPARE - Hydrologist (Medical): No    Lack of Transportation (Non-Medical): No  Physical Activity: Inactive (08/24/2021)   Exercise Vital Sign    Days of Exercise per Week: 0 days    Minutes of Exercise per Session: 0 min  Stress: No Stress Concern Present (08/24/2021)   [place]    Feeling of Stress : Not at all  Social Connections: Socially Isolated (08/24/2021)   Social Connection and Isolation Panel [NHANES]    Frequency of Communication with Friends and Family: More than three times a week    Frequency of Social Gatherings with Friends and Family: More than three times a week    Attends Religious Services: Never    Marine scientist or Organizations: No    Attends Archivist Meetings: Never    Marital Status: Widowed  Intimate Partner Violence: Not At Risk (08/24/2021)   Humiliation, Afraid, Rape, and Kick questionnaire    Fear of Current or Ex-Partner: No    Emotionally Abused: No    Physically Abused: No    Sexually Abused: No    Past Surgical History:  Procedure Laterality Date   COLONOSCOPY  11/27/2012   LUMBAR LAMINECTOMY      Family History  Problem Relation Age of Onset   Colon cancer Father 10   Hypertension Father    Diabetes Father    Heart attack Father    Colon polyps Neg Hx    Esophageal cancer Neg Hx    Rectal cancer Neg Hx    Stomach cancer Neg Hx     No Known Allergies  Current Outpatient Medications on File Prior to Visit  Medication Sig Dispense Refill   albuterol (PROVENTIL) (2.5 MG/3ML) 0.083% nebulizer solution Take 3 mLs (2.5 mg total) by nebulization every 6 (six) hours as needed for wheezing or shortness of breath. Dx code: J44.9 75 mL 11   albuterol (VENTOLIN HFA) 108 (90 Base) MCG/ACT inhaler INHALE 1 TO 2 PUFFS BY MOUTH EVERY 6 HOURS AS NEEDED 9 g 2   amLODipine (NORVASC) 5 MG tablet Take 1 tablet by mouth once daily 90 tablet 0   atorvastatin (LIPITOR) 40 MG tablet Take 1 tablet by mouth once daily 90 tablet 0   citalopram (CELEXA) 40 MG tablet Take 1 tablet by mouth once daily 90 tablet 0   ibuprofen (ADVIL) 200 MG tablet Take 400 mg by mouth every 6 (six) hours as needed.     ketoconazole (NIZORAL) 2 % cream Apply 1 application topically daily. 15 g 0   LORazepam (ATIVAN) 0.5  MG tablet Take 1 tablet by mouth twice daily 60 tablet 0   losartan (COZAAR) 50 MG tablet Take 1 tablet by mouth once daily 90 tablet 0   omeprazole (PRILOSEC) 40 MG capsule Take 1 capsule (40 mg total) by mouth daily. 90 capsule 3   tamsulosin (FLOMAX) 0.4 MG CAPS capsule Take 1 capsule (0.4 mg total) by mouth daily. 90 capsule 3   TRELEGY ELLIPTA 100-62.5-25 MCG/ACT AEPB INHALE 1 PUFF ONCE DAILY  60 each 5   No current facility-administered medications on file prior to visit.    BP 120/70   Pulse 100   Resp 18   Ht '5\' 9"'$  (1.753 m)   Wt 204 lb 12.8 oz (92.9 kg)   SpO2 95%   BMI 30.24 kg/m        Objective:   Physical Exam  General Mental Status- Alert. General Appearance- Not in acute distress.   Skin General: Color- Normal Color. Moisture- Normal Moisture.  Neck Carotid Arteries- Normal color. Moisture- Normal Moisture. No carotid bruits. No JVD.  Chest and Lung Exam Auscultation: Breath Sounds:-Normal.  Cardiovascular Auscultation:Rythm- Regular. Murmurs & Other Heart Sounds:Auscultation of the heart reveals- No Murmurs.  Abdomen Inspection:-Inspeection Normal. Palpation/Percussion:Note:No mass. Palpation and Percussion of the abdomen reveal- Non Tender, Non Distended + BS, no rebound or guarding.   Neurologic Cranial Nerve exam:- CN III-XII intact(No nystagmus), symmetric smile. Strength:- 5/5 equal and symmetric strength both upper and lower extremities.       Assessment & Plan:   Anxiety and depression. More anxiety and presently both controlled. Continue celexa and ativan. Up to date on contract and uds.    Copd stable. Continue trelegy and please call your pulmonologist to make follow up appt.   Hyperlipidemia. Get cmp and lipid panel today. Continue atorvastatin.   Htn- bp controlled. Continue losartan 50 mg daily and amlodipine 5 mg daily.    Elevated psa. Will repeat that level today. And will very likely refer you again to urologist as you last  value was elevated. Also referred you again in February not sure why they did not call??  Sent to [place] [phone]   Counseled on quitting smoking.   Follow up in 3 months or sooner if needed.  Mackie Pai, PA-C

## 2022-06-08 NOTE — Patient Instructions (Signed)
Anxiety and depression. More anxiety and presently both controlled. Continue celexa and ativan. Up to date on contract and uds.    Copd stable. Continue trelegy and please call your pulmonologist to make follow up appt.   Hyperlipidemia. Get cmp and lipid panel today. Continue atorvastatin.   Htn- bp controlled. Continue losartan 50 mg daily and amlodipine 5 mg daily.    Elevated psa. Will repeat that level today. And will very likely refer you again to urologist as you last value was elevated. Also referred you again in February not sure why they did not call??  Sent to [place] [phone]   Counseled on quitting smoking.   Follow up in 3 months or sooner if needed.

## 2022-06-08 NOTE — Addendum Note (Signed)
Addended by: Anabel Halon on: 06/08/2022 05:44 PM   Modules accepted: Orders

## 2022-06-26 ENCOUNTER — Telehealth: Payer: Self-pay | Admitting: Pulmonary Disease

## 2022-06-26 NOTE — Telephone Encounter (Signed)
Called and spoke with pt who stated he has been summoned for jury duty and wanted to know if JD could write a letter of excusal. Stated to pt that we would need that form so we could get a letter written for him and he verbalized understanding. Nothing further needed.

## 2022-07-06 ENCOUNTER — Other Ambulatory Visit: Payer: Self-pay | Admitting: Medical

## 2022-07-06 NOTE — Telephone Encounter (Addendum)
Requesting: ativan Contract:09/19/21 UDS:09/19/21 Last Visit:06/08/22 Next Visit:09/08/22 Last Refill:06/05/22  Please Advise   Rx refill sent.

## 2022-07-07 ENCOUNTER — Telehealth: Payer: Self-pay | Admitting: Pulmonary Disease

## 2022-07-07 NOTE — Telephone Encounter (Signed)
error 

## 2022-07-07 NOTE — Telephone Encounter (Signed)
Called and spoke with patient. He would like to have the letter mailed to him. Verified his address.   Nothing further needed at time of call.

## 2022-07-07 NOTE — Telephone Encounter (Signed)
Received the jury summons from patient. Spoke with Dr. Erin Fulling and he is ok with a letter excusing the patient from jury duty.   Letter will be typed, signed and given to patient.

## 2022-07-18 ENCOUNTER — Other Ambulatory Visit: Payer: Self-pay | Admitting: Medical

## 2022-07-30 IMAGING — CT CT CHEST LUNG CANCER SCREENING LOW DOSE W/O CM
2 series · 15 of 34 positions shown, 18 images · non-contrast
Comparison: None.

CLINICAL DATA: 64-year-old male with 94 pack-year history of
smoking. Lung cancer screening.

EXAM:
CT CHEST WITHOUT CONTRAST LOW-DOSE FOR LUNG CANCER SCREENING
TECHNIQUE: Multidetector CT imaging of the chest was performed following the
standard protocol without IV contrast.

[Series 3: lungs · axial · 0.70mm/px · z∈[-365,-82]mm · 12 of 333 slices shown, 15 images]
[im 25/333  mediastinal]
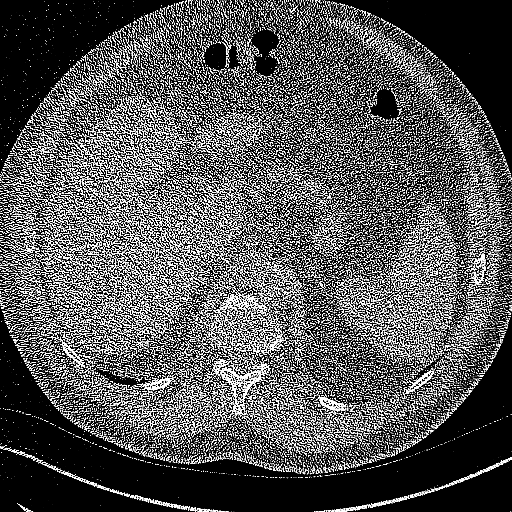
[im 25/333  lung]
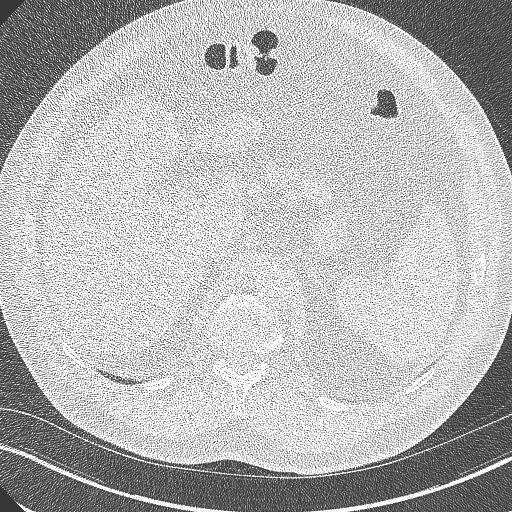
[im 50/333  lung]
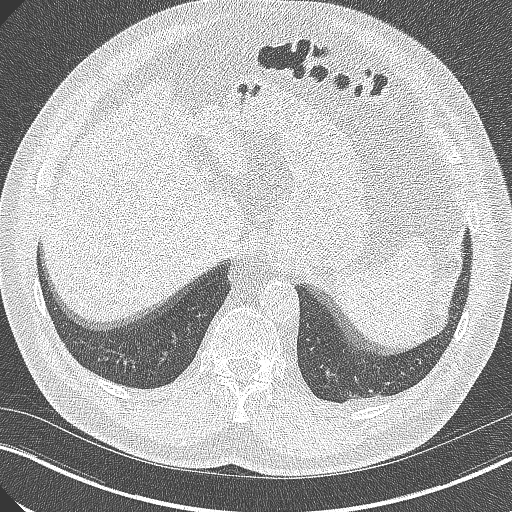
[im 74/333  lung]
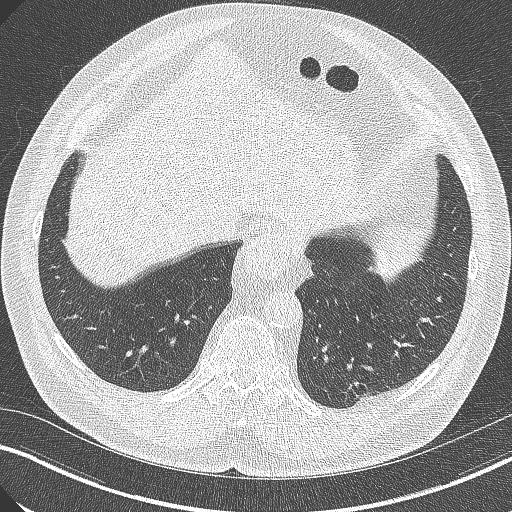
[im 99/333  lung]
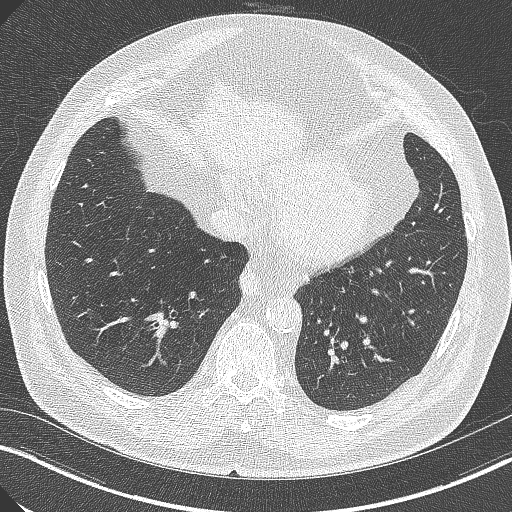
[im 123/333  mediastinal]
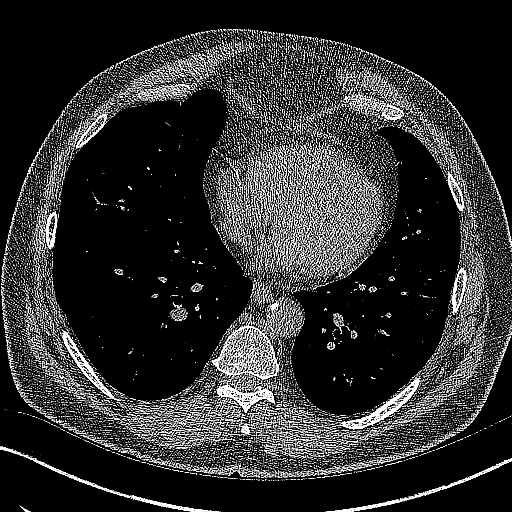
[im 123/333  lung]
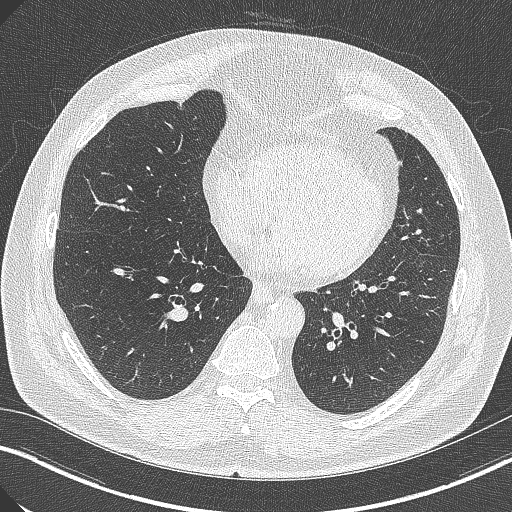
[im 148/333  lung]
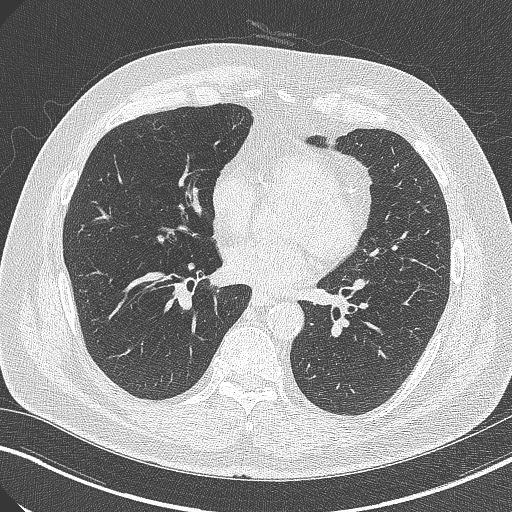
[im 185/333  lung]
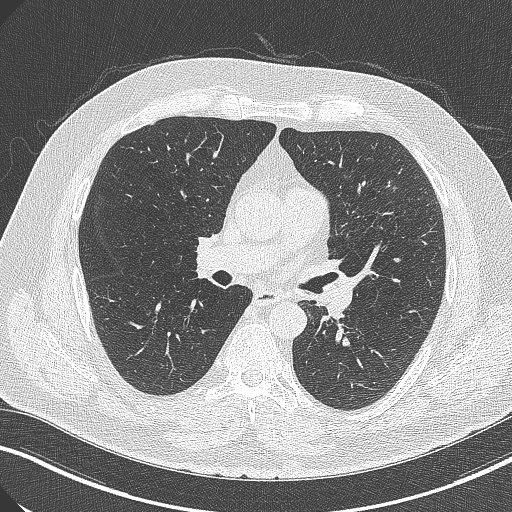
[im 210/333  lung]
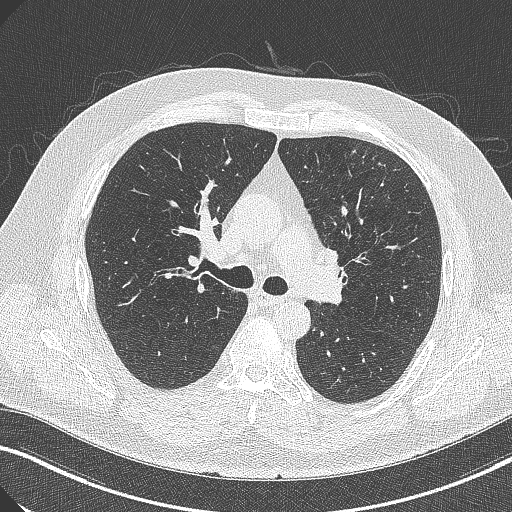
[im 234/333  mediastinal]
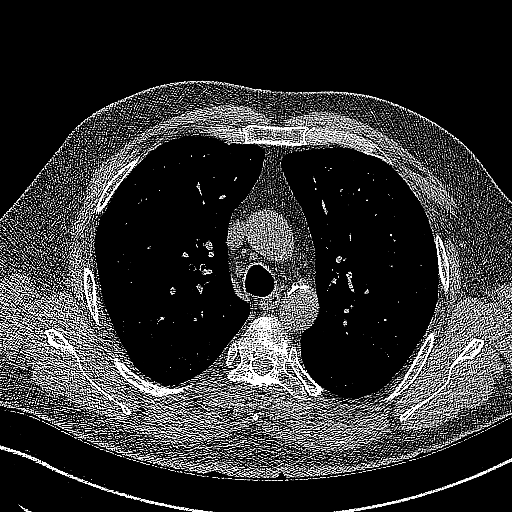
[im 234/333  lung]
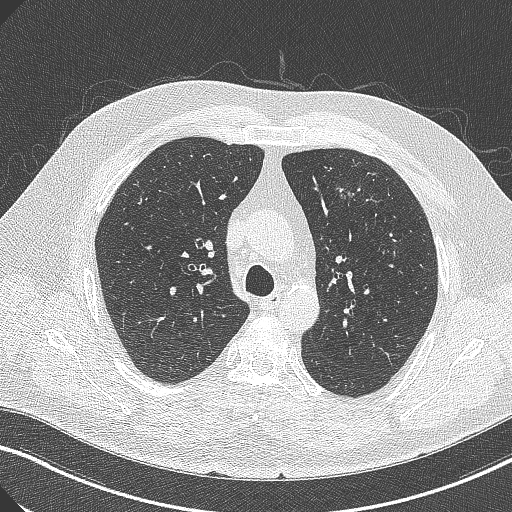
[im 259/333  lung]
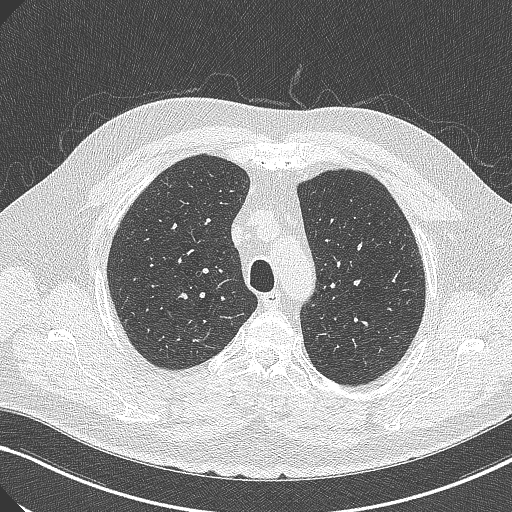
[im 283/333  lung]
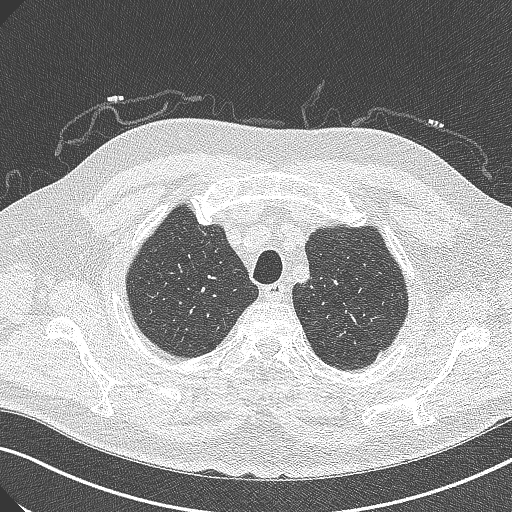
[im 308/333  lung]
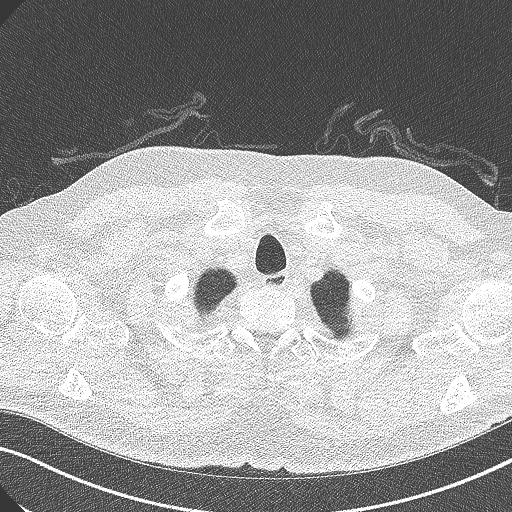

[Series 5: coronal · coronal · 0.68mm/px · 3 of 278 slices shown]
[im 56/278  lung]
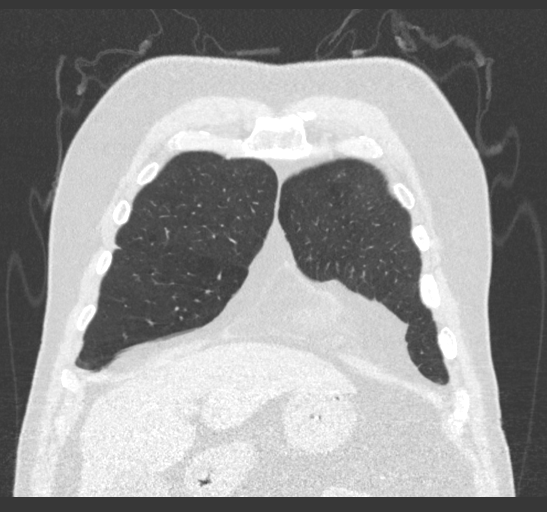
[im 111/278  lung]
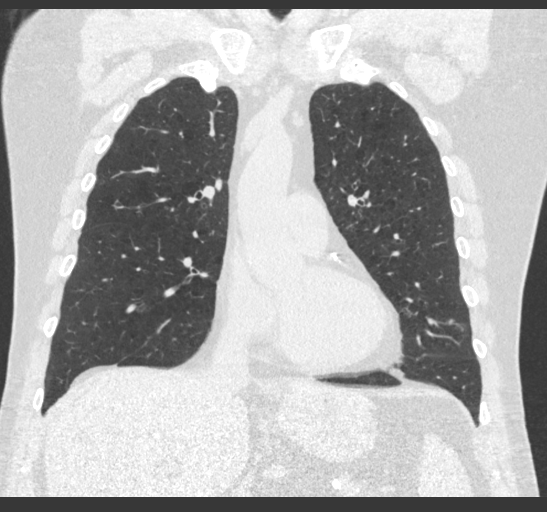
[im 167/278  lung]
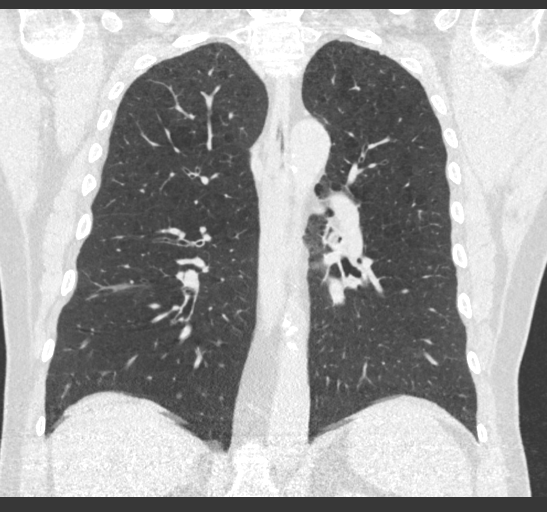

[15 of 34 positions shown; findings below may reference images not displayed]

FINDINGS: Cardiovascular: The heart size is normal. No substantial pericardial
effusion. Coronary artery calcification is evident. Atherosclerotic
calcification is noted in the wall of the thoracic aorta.

Mediastinum/Nodes: No mediastinal lymphadenopathy. No evidence for
gross hilar lymphadenopathy although assessment is limited by the
lack of intravenous contrast on today's study. Tiny hiatal hernia.
The esophagus has normal imaging features. There is no axillary
lymphadenopathy.

Lungs/Pleura: Centrilobular emphsyema noted. Scattered solid and non
solid bilateral pulmonary nodules are identified. No overtly
suspicious nodule or mass. No focal airspace consolidation. No
pleural effusion.

Upper Abdomen: 2.8 cm left adrenal nodule has low attenuation
consistent with adenoma. This is stable since MRI lumbar spine
02/20/2006.

Musculoskeletal: No worrisome lytic or sclerotic osseous
abnormality.
IMPRESSION: 1. Lung-RADS 2, benign appearance or behavior. Continue annual
screening with low-dose chest CT without contrast in 12 months.
2. 2.8 cm benign left adrenal adenoma.
3. Emphysema (IG1VL-H74.G) and Aortic Atherosclerosis (IG1VL-170.0)

## 2022-08-10 ENCOUNTER — Other Ambulatory Visit: Payer: Self-pay | Admitting: Medical

## 2022-08-10 NOTE — Telephone Encounter (Addendum)
Requesting:ativan 0.5 mg  Contract:unknown UDS:09/19/21 Last Visit:06/08/21 Next Visit:09/08/21 Last Refill:07/06/21  Please Advise   Rx ativan sent to pt pharmacy.  Mackie Pai, PA-C

## 2022-08-22 ENCOUNTER — Ambulatory Visit (INDEPENDENT_AMBULATORY_CARE_PROVIDER_SITE_OTHER): Payer: Medicare Other | Admitting: Pulmonary Disease

## 2022-08-22 ENCOUNTER — Encounter: Payer: Self-pay | Admitting: Pulmonary Disease

## 2022-08-22 VITALS — BP 116/58 | HR 91 | Temp 98.3°F | Ht 69.0 in | Wt 201.8 lb

## 2022-08-22 DIAGNOSIS — F17210 Nicotine dependence, cigarettes, uncomplicated: Secondary | ICD-10-CM

## 2022-08-22 DIAGNOSIS — J411 Mucopurulent chronic bronchitis: Secondary | ICD-10-CM

## 2022-08-22 NOTE — Patient Instructions (Addendum)
Continue trelegy ellipta 1 puff daily.   Continue albuterol as needed 1-2 puffs every 4-6 hours.    Recommend smoking cessation with nicotine patches '21mg'$  daily and mini nicotine lozenges as needed.    We will reach out to our lung cancer screening team in regards to your next screening scan.   Follow up in 6 months.

## 2022-08-22 NOTE — Progress Notes (Signed)
Synopsis: Follow up for COPD  Subjective:   PATIENT ID: Trevor Mack GENDER: male DOB: 17-Sep-1956, MRN: [id number]  HPI  Chief Complaint  Patient presents with   Follow-up    Breathing is unchanged. He is smoking 1.5 ppd. He is using his albuterol inhaler 2-3 x per day. Rarely uses neb.    Trevor Mack is a 66 year old male, daily smoker with history of COPD who returns to pulmonary clinic for follow up of COPD.  He has been doing ok since last visit. Reports his breathing has been stable. He is smoking 1.5 packs per day.  OV 02/22/22 His breathing is doing ok. He remains on trelegy ellipta 100, 1 puff daily and is using albuterol 4-6 times per day. He continues to smoke nearly 2 packs per day. He did not tolerate azithromycin therapy as it upset his stomach.  PFTs show moderately severe obstruction with significant bronchodilator response, normal TLC and moderate diffusion defect.   He has completed an overnight oximetry test at [facility] but we do not have the report yet.   Past Medical History:  Diagnosis Date   ANXIETY 07/23/2008   COLONIC POLYPS, HX OF 03/26/2006   MULTIPLE FRAGMENTS OF TUBULAR ADENOMAS POLYPS   COPD (chronic obstructive pulmonary disease) ([place])    Diverticulosis    Family history of colon cancer    Father    GERD 07/23/2008   Hepatitis C    HYPERLIPIDEMIA 07/23/2008   HYPERTENSION 07/23/2008   TENDINITIS, LEFT THUMB 07/15/2010     Family History  Problem Relation Age of Onset   Colon cancer Father 86   Hypertension Father    Diabetes Father    Heart attack Father    Colon polyps Neg Hx    Esophageal cancer Neg Hx    Rectal cancer Neg Hx    Stomach cancer Neg Hx      Social History   Socioeconomic History   Marital status: Widowed    Spouse name: Not on file   Number of children: 0   Years of education: Not on file   Highest education level: Not on file  Occupational History    Employer: [employer]  Tobacco Use   Smoking  status: Every Day    Packs/day: 1.50    Years: 49.00    Total pack years: 73.50    Types: Cigarettes   Smokeless tobacco: Never  Vaping Use   Vaping Use: Never used  Substance and Sexual Activity   Alcohol use: No   Drug use: No   Sexual activity: Not on file  Other Topics Concern   Not on file  Social History Narrative   Not on file   Social Determinants of Health   Financial Resource Strain: Low Risk  (08/24/2021)   Overall Financial Resource Strain (CARDIA)    Difficulty of Paying Living Expenses: Not hard at all  Food Insecurity: No Food Insecurity (08/24/2021)   Hunger Vital Sign    Worried About Running Out of Food in the Last Year: Never true    Ran Out of Food in the Last Year: Never true  Transportation Needs: No Transportation Needs (08/24/2021)   PRAPARE - Hydrologist (Medical): No    Lack of Transportation (Non-Medical): No  Physical Activity: Inactive (08/24/2021)   Exercise Vital Sign    Days of Exercise per Week: 0 days    Minutes of Exercise per Session: 0 min  Stress: No Stress  Concern Present (08/24/2021)   [place]    Feeling of Stress : Not at all  Social Connections: Socially Isolated (08/24/2021)   Social Connection and Isolation Panel [NHANES]    Frequency of Communication with Friends and Family: More than three times a week    Frequency of Social Gatherings with Friends and Family: More than three times a week    Attends Religious Services: Never    Marine scientist or Organizations: No    Attends Archivist Meetings: Never    Marital Status: Widowed  Intimate Partner Violence: Not At Risk (08/24/2021)   Humiliation, Afraid, Rape, and Kick questionnaire    Fear of Current or Ex-Partner: No    Emotionally Abused: No    Physically Abused: No    Sexually Abused: No     No Known Allergies   Outpatient Medications Prior to Visit   Medication Sig Dispense Refill   albuterol (PROVENTIL) (2.5 MG/3ML) 0.083% nebulizer solution Take 3 mLs (2.5 mg total) by nebulization every 6 (six) hours as needed for wheezing or shortness of breath. Dx code: J44.9 75 mL 11   albuterol (VENTOLIN HFA) 108 (90 Base) MCG/ACT inhaler INHALE 1 TO 2 PUFFS BY MOUTH EVERY 6 HOURS AS NEEDED 9 g 2   amLODipine (NORVASC) 5 MG tablet Take 1 tablet by mouth once daily 90 tablet 0   atorvastatin (LIPITOR) 40 MG tablet Take 1 tablet by mouth once daily 90 tablet 0   citalopram (CELEXA) 40 MG tablet Take 1 tablet by mouth once daily 90 tablet 0   ibuprofen (ADVIL) 200 MG tablet Take 400 mg by mouth every 6 (six) hours as needed.     ketoconazole (NIZORAL) 2 % cream Apply 1 application topically daily. 15 g 0   LORazepam (ATIVAN) 0.5 MG tablet Take 1 tablet by mouth twice daily 60 tablet 0   losartan (COZAAR) 50 MG tablet Take 1 tablet by mouth once daily 90 tablet 0   omeprazole (PRILOSEC) 40 MG capsule Take 1 capsule (40 mg total) by mouth daily. 90 capsule 3   tamsulosin (FLOMAX) 0.4 MG CAPS capsule Take 1 capsule (0.4 mg total) by mouth daily. 90 capsule 3   TRELEGY ELLIPTA 100-62.5-25 MCG/ACT AEPB INHALE 1 PUFF ONCE DAILY 60 each 5   No facility-administered medications prior to visit.    Review of Systems  Constitutional:  Negative for chills, fever and weight loss.  HENT:  Negative for congestion, sinus pain and sore throat.   Eyes: Negative.   Respiratory:  Positive for cough, sputum production, shortness of breath and wheezing. Negative for hemoptysis.   Cardiovascular:  Negative for chest pain and leg swelling.  Gastrointestinal:  Negative for abdominal pain, heartburn and nausea.  Genitourinary: Negative.   Musculoskeletal: Negative.   Neurological:  Negative for dizziness, weakness and headaches.  Endo/Heme/Allergies: Negative.   Psychiatric/Behavioral: Negative.      Objective:   Vitals:   08/22/22 0933  BP: (!) 116/58  Pulse: 91   Temp: 98.3 F (36.8 C)  TempSrc: Oral  SpO2: 95%  Weight: 201 lb 12.8 oz (91.5 kg)  Height: '5\' 9"'$  (1.753 m)      Physical Exam Constitutional:      General: He is not in acute distress.    Appearance: He is obese. He is not ill-appearing.  HENT:     Head: Normocephalic and atraumatic.  Cardiovascular:     Pulses: Normal pulses.  Heart sounds: Normal heart sounds. No murmur heard. Pulmonary:     Effort: No accessory muscle usage or prolonged expiration.     Breath sounds: Decreased air movement present. Decreased breath sounds present. No wheezing, rhonchi or rales.  Musculoskeletal:     Right lower leg: No edema.     Left lower leg: No edema.  Skin:    Capillary Refill: Capillary refill takes less than 2 seconds.  Neurological:     General: No focal deficit present.     Mental Status: He is alert.     Gait: Gait normal.  Psychiatric:        Mood and Affect: Mood normal.        Behavior: Behavior normal.        Thought Content: Thought content normal.        Judgment: Judgment normal.    CBC    Component Value Date/Time   WBC 11.3 (H) 11/08/2020 1131   RBC 5.52 11/08/2020 1131   HGB 15.5 11/08/2020 1131   HCT 46.5 11/08/2020 1131   PLT 188.0 11/08/2020 1131   MCV 84.1 11/08/2020 1131   MCHC 33.3 11/08/2020 1131   RDW 15.3 11/08/2020 1131   LYMPHSABS 2.2 11/08/2020 1131   MONOABS 0.6 11/08/2020 1131   EOSABS 0.1 11/08/2020 1131   BASOSABS 0.1 11/08/2020 1131      Latest Ref Rng & Units 06/08/2022    8:21 AM 02/06/2022    9:02 AM 02/17/2021    8:22 AM  BMP  Glucose 70 - 99 mg/dL 84  79  93   BUN 6 - 23 mg/dL '14  11  12   '$ Creatinine 0.40 - 1.50 mg/dL 1.05  1.13  0.99   Sodium 135 - 145 mEq/L 141  140  139   Potassium 3.5 - 5.1 mEq/L 3.9  3.7  3.9   Chloride 96 - 112 mEq/L 103  102  101   CO2 19 - 32 mEq/L '29  30  30   '$ Calcium 8.4 - 10.5 mg/dL 8.7  9.1  8.8    Chest imaging: CT Chest 01/19/2021 Mediastinum/Nodes: No mediastinal lymphadenopathy. No  evidence for gross hilar lymphadenopathy although assessment is limited by the lack of intravenous contrast on today's study. Tiny hiatal hernia. The esophagus has normal imaging features. There is no axillary lymphadenopathy.   Lungs/Pleura: Centrilobular emphsyema noted. Scattered solid and non solid bilateral pulmonary nodules are identified. No overtly suspicious nodule or mass. No focal airspace consolidation. No pleural effusion.  CXR 10/2018 There is no edema or consolidation. The heart size and pulmonary vascularity are normal. No adenopathy. No evident bone lesions.  PFT:    Latest Ref Rng & Units 08/05/2021    8:30 AM 04/12/2017    2:47 PM  PFT Results  FVC-Pre L 2.88  2.43   FVC-Predicted Pre % 64  52   FVC-Post L 3.12  2.85   FVC-Predicted Post % 69  61   Pre FEV1/FVC % % 55  62   Post FEV1/FCV % % 56  61   FEV1-Pre L 1.57  1.49   FEV1-Predicted Pre % 46  42   FEV1-Post L 1.75  1.75   DLCO uncorrected ml/min/mmHg 11.54  12.29   DLCO UNC% % 44  39   DLCO corrected ml/min/mmHg 11.54  11.82   DLCO COR %Predicted % 44  38   DLVA Predicted % 59  61   TLC L 6.84  7.02   TLC % Predicted %  100  103   RV % Predicted % 155  214     PFT 2022: Moderate severe obstruction, significant bronchodilator response  Assessment & Plan:   Mucopurulent chronic bronchitis (HCC)  Cigarette smoker  Discussion: Trevor Mack is a 66 year old male, daily smoker with history of COPD who returns to pulmonary clinic for follow up.   His breathing appears at baseline but he continues to have significant cough with sputum production daily and significant exertional dyspnea.   He is to continue on trelegy ellipta 1 puff daily and albuterol inhaler as needed.   Patient to follow up in 6 months.  Freda Jackson, MD [facility] Office: [phone]     Current Outpatient Medications:    albuterol (PROVENTIL) (2.5 MG/3ML) 0.083% nebulizer solution, Take 3 mLs  (2.5 mg total) by nebulization every 6 (six) hours as needed for wheezing or shortness of breath. Dx code: J44.9, Disp: 75 mL, Rfl: 11   albuterol (VENTOLIN HFA) 108 (90 Base) MCG/ACT inhaler, INHALE 1 TO 2 PUFFS BY MOUTH EVERY 6 HOURS AS NEEDED, Disp: 9 g, Rfl: 2   amLODipine (NORVASC) 5 MG tablet, Take 1 tablet by mouth once daily, Disp: 90 tablet, Rfl: 0   atorvastatin (LIPITOR) 40 MG tablet, Take 1 tablet by mouth once daily, Disp: 90 tablet, Rfl: 0   citalopram (CELEXA) 40 MG tablet, Take 1 tablet by mouth once daily, Disp: 90 tablet, Rfl: 0   ibuprofen (ADVIL) 200 MG tablet, Take 400 mg by mouth every 6 (six) hours as needed., Disp: , Rfl:    ketoconazole (NIZORAL) 2 % cream, Apply 1 application topically daily., Disp: 15 g, Rfl: 0   LORazepam (ATIVAN) 0.5 MG tablet, Take 1 tablet by mouth twice daily, Disp: 60 tablet, Rfl: 0   losartan (COZAAR) 50 MG tablet, Take 1 tablet by mouth once daily, Disp: 90 tablet, Rfl: 0   omeprazole (PRILOSEC) 40 MG capsule, Take 1 capsule (40 mg total) by mouth daily., Disp: 90 capsule, Rfl: 3   tamsulosin (FLOMAX) 0.4 MG CAPS capsule, Take 1 capsule (0.4 mg total) by mouth daily., Disp: 90 capsule, Rfl: 3   TRELEGY ELLIPTA 100-62.5-25 MCG/ACT AEPB, INHALE 1 PUFF ONCE DAILY, Disp: 60 each, Rfl: 5

## 2022-08-28 ENCOUNTER — Other Ambulatory Visit: Payer: Self-pay | Admitting: Pulmonary Disease

## 2022-08-30 ENCOUNTER — Ambulatory Visit (INDEPENDENT_AMBULATORY_CARE_PROVIDER_SITE_OTHER): Payer: Medicare Other | Admitting: *Deleted

## 2022-08-30 DIAGNOSIS — Z0000 Encounter for general adult medical examination without abnormal findings: Secondary | ICD-10-CM

## 2022-08-30 NOTE — Patient Instructions (Signed)
Mr. Trevor Mack , Thank you for taking time to come for your Medicare Wellness Visit. I appreciate your ongoing commitment to your health goals. Please review the following plan we discussed and let me know if I can assist you in the future.   These are the goals we discussed:  Goals      Patient Stated     Cut back on salt intake        This is a list of the screening recommended for you and due dates:  Health Maintenance  Topic Date Due   HIV Screening  Never done   Zoster (Shingles) Vaccine (1 of 2) Never done   COVID-19 Vaccine (3 - Moderna risk series) 05/04/2020   Colon Cancer Screening  02/07/2022   Flu Shot  07/25/2022   Pneumonia Vaccine (3 - PPSV23 or PCV20) 03/06/2023   Tetanus Vaccine  05/17/2030   Hepatitis C Screening: USPSTF Recommendation to screen - Ages 18-79 yo.  Completed   HPV Vaccine  Aged Out       Next appointment: Follow up in one year for your annual wellness visit.   Preventive Care 65 Years and Older, Male Preventive care refers to lifestyle choices and visits with your health care provider that can promote health and wellness. What does preventive care include? A yearly physical exam. This is also called an annual well check. Dental exams once or twice a year. Routine eye exams. Ask your health care provider how often you should have your eyes checked. Personal lifestyle choices, including: Daily care of your teeth and gums. Regular physical activity. Eating a healthy diet. Avoiding tobacco and drug use. Limiting alcohol use. Practicing safe sex. Taking low doses of aspirin every day. Taking vitamin and mineral supplements as recommended by your health care provider. What happens during an annual well check? The services and screenings done by your health care provider during your annual well check will depend on your age, overall health, lifestyle risk factors, and family history of disease. Counseling  Your health care provider may ask you  questions about your: Alcohol use. Tobacco use. Drug use. Emotional well-being. Home and relationship well-being. Sexual activity. Eating habits. History of falls. Memory and ability to understand (cognition). Work and work Statistician. Screening  You may have the following tests or measurements: Height, weight, and BMI. Blood pressure. Lipid and cholesterol levels. These may be checked every 5 years, or more frequently if you are over 65 years old. Skin check. Lung cancer screening. You may have this screening every year starting at age 68 if you have a 30-pack-year history of smoking and currently smoke or have quit within the past 15 years. Fecal occult blood test (FOBT) of the stool. You may have this test every year starting at age 32. Flexible sigmoidoscopy or colonoscopy. You may have a sigmoidoscopy every 5 years or a colonoscopy every 10 years starting at age 77. Prostate cancer screening. Recommendations will vary depending on your family history and other risks. Hepatitis C blood test. Hepatitis B blood test. Sexually transmitted disease (STD) testing. Diabetes screening. This is done by checking your blood sugar (glucose) after you have not eaten for a while (fasting). You may have this done every 1-3 years. Abdominal aortic aneurysm (AAA) screening. You may need this if you are a current or former smoker. Osteoporosis. You may be screened starting at age 43 if you are at high risk. Talk with your health care provider about your test results, treatment options, and  if necessary, the need for more tests. Vaccines  Your health care provider may recommend certain vaccines, such as: Influenza vaccine. This is recommended every year. Tetanus, diphtheria, and acellular pertussis (Tdap, Td) vaccine. You may need a Td booster every 10 years. Zoster vaccine. You may need this after age 71. Pneumococcal 13-valent conjugate (PCV13) vaccine. One dose is recommended after age  80. Pneumococcal polysaccharide (PPSV23) vaccine. One dose is recommended after age 2. Talk to your health care provider about which screenings and vaccines you need and how often you need them. This information is not intended to replace advice given to you by your health care provider. Make sure you discuss any questions you have with your health care provider. Document Released: 01/07/2016 Document Revised: 08/30/2016 Document Reviewed: 10/12/2015 Elsevier Interactive Patient Education  2017 [place] Prevention in the Home Falls can cause injuries. They can happen to people of all ages. There are many things you can do to make your home safe and to help prevent falls. What can I do on the outside of my home? Regularly fix the edges of walkways and driveways and fix any cracks. Remove anything that might make you trip as you walk through a door, such as a raised step or threshold. Trim any bushes or trees on the path to your home. Use bright outdoor lighting. Clear any walking paths of anything that might make someone trip, such as rocks or tools. Regularly check to see if handrails are loose or broken. Make sure that both sides of any steps have handrails. Any raised decks and porches should have guardrails on the edges. Have any leaves, snow, or ice cleared regularly. Use sand or salt on walking paths during winter. Clean up any spills in your garage right away. This includes oil or grease spills. What can I do in the bathroom? Use night lights. Install grab bars by the toilet and in the tub and shower. Do not use towel bars as grab bars. Use non-skid mats or decals in the tub or shower. If you need to sit down in the shower, use a plastic, non-slip stool. Keep the floor dry. Clean up any water that spills on the floor as soon as it happens. Remove soap buildup in the tub or shower regularly. Attach bath mats securely with double-sided non-slip rug tape. Do not have throw  rugs and other things on the floor that can make you trip. What can I do in the bedroom? Use night lights. Make sure that you have a light by your bed that is easy to reach. Do not use any sheets or blankets that are too big for your bed. They should not hang down onto the floor. Have a firm chair that has side arms. You can use this for support while you get dressed. Do not have throw rugs and other things on the floor that can make you trip. What can I do in the kitchen? Clean up any spills right away. Avoid walking on wet floors. Keep items that you use a lot in easy-to-reach places. If you need to reach something above you, use a strong step stool that has a grab bar. Keep electrical cords out of the way. Do not use floor polish or wax that makes floors slippery. If you must use wax, use non-skid floor wax. Do not have throw rugs and other things on the floor that can make you trip. What can I do with my stairs? Do not leave any  items on the stairs. Make sure that there are handrails on both sides of the stairs and use them. Fix handrails that are broken or loose. Make sure that handrails are as long as the stairways. Check any carpeting to make sure that it is firmly attached to the stairs. Fix any carpet that is loose or worn. Avoid having throw rugs at the top or bottom of the stairs. If you do have throw rugs, attach them to the floor with carpet tape. Make sure that you have a light switch at the top of the stairs and the bottom of the stairs. If you do not have them, ask someone to add them for you. What else can I do to help prevent falls? Wear shoes that: Do not have high heels. Have rubber bottoms. Are comfortable and fit you well. Are closed at the toe. Do not wear sandals. If you use a stepladder: Make sure that it is fully opened. Do not climb a closed stepladder. Make sure that both sides of the stepladder are locked into place. Ask someone to hold it for you, if  possible. Clearly mark and make sure that you can see: Any grab bars or handrails. First and last steps. Where the edge of each step is. Use tools that help you move around (mobility aids) if they are needed. These include: Canes. Walkers. Scooters. Crutches. Turn on the lights when you go into a dark area. Replace any light bulbs as soon as they burn out. Set up your furniture so you have a clear path. Avoid moving your furniture around. If any of your floors are uneven, fix them. If there are any pets around you, be aware of where they are. Review your medicines with your doctor. Some medicines can make you feel dizzy. This can increase your chance of falling. Ask your doctor what other things that you can do to help prevent falls. This information is not intended to replace advice given to you by your health care provider. Make sure you discuss any questions you have with your health care provider. Document Released: 10/07/2009 Document Revised: 05/18/2016 Document Reviewed: 01/15/2015 Elsevier Interactive Patient Education  2017 [employer].

## 2022-08-30 NOTE — Progress Notes (Addendum)
Subjective:   Trevor Mack is a 66 y.o. male who presents for Medicare Annual/Subsequent preventive examination. I connected with  [name] on 08/30/22 by a audio enabled telemedicine application and verified that I am speaking with the correct person using two identifiers.  Patient Location: Home  Provider Location: Office/Clinic  I discussed the limitations of evaluation and management by telemedicine. The patient expressed understanding and agreed to proceed.    Review of Systems    Defer to PCP Cardiac Risk Factors include: advanced age (>6mn, >>4women);sedentary lifestyle;male gender;hypertension;smoking/ tobacco exposure     Objective:    There were no vitals filed for this visit. There is no height or weight on file to calculate BMI.     08/30/2022    9:01 AM 08/24/2021    8:25 AM 01/01/2018    4:24 AM  Advanced Directives  Does Patient Have a Medical Advance Directive? Yes Yes Yes  Type of AParamedicof AChr[place]Living will [place] Terrace[place] will Living will  Does patient want to make changes to medical advance directive? No - Patient declined  No - Patient declined  Copy of [place] Chart? No - copy requested No - copy requested     Current Medications (verified) Outpatient Encounter Medications as of 08/30/2022  Medication Sig   albuterol (PROVENTIL) (2.5 MG/3ML) 0.083% nebulizer solution Take 3 mLs (2.5 mg total) by nebulization every 6 (six) hours as needed for wheezing or shortness of breath. Dx code: J44.9   albuterol (VENTOLIN HFA) 108 (90 Base) MCG/ACT inhaler INHALE 1 TO 2 PUFFS BY MOUTH EVERY 6 HOURS AS NEEDED   amLODipine (NORVASC) 5 MG tablet Take 1 tablet by mouth once daily   atorvastatin (LIPITOR) 40 MG tablet Take 1 tablet by mouth once daily   citalopram (CELEXA) 40 MG tablet Take 1 tablet by mouth once daily   ibuprofen (ADVIL) 200 MG tablet Take 400 mg by mouth every 6 (six)  hours as needed.   ketoconazole (NIZORAL) 2 % cream Apply 1 application topically daily.   LORazepam (ATIVAN) 0.5 MG tablet Take 1 tablet by mouth twice daily   losartan (COZAAR) 50 MG tablet Take 1 tablet by mouth once daily   omeprazole (PRILOSEC) 40 MG capsule Take 1 capsule (40 mg total) by mouth daily.   tamsulosin (FLOMAX) 0.4 MG CAPS capsule Take 1 capsule (0.4 mg total) by mouth daily.   TRELEGY ELLIPTA 100-62.5-25 MCG/ACT AEPB INHALE 1 PUFF ONCE DAILY   No facility-administered encounter medications on file as of 08/30/2022.    Allergies (verified) Patient has no known allergies.   History: Past Medical History:  Diagnosis Date   ANXIETY 07/23/2008   COLONIC POLYPS, HX OF 03/26/2006   MULTIPLE FRAGMENTS OF TUBULAR ADENOMAS POLYPS   [place] obstructive [place]    Diverticulosis    Family history of colon cancer    Father    GERD 07/23/2008   Hepatitis C    HYPERLIPIDEMIA 07/23/2008   HYPERTENSION 07/23/2008   TENDINITIS, LEFT THUMB 07/15/2010   Past Surgical History:  Procedure Laterality Date   COLONOSCOPY  11/27/2012   LUMBAR LAMINECTOMY     Family History  Problem Relation Age of Onset   Colon cancer Father 662  Hypertension Father    Diabetes Father    Heart attack Father    Colon polyps Neg Hx    Esophageal cancer Neg Hx    Rectal cancer Neg  Hx    Stomach cancer Neg Hx    Social History   Socioeconomic History   Marital status: Widowed    Spouse name: Not on file   Number of children: 0   Years of education: Not on file   Highest education level: Not on file  Occupational History    Employer: [employer]  Tobacco Use   Smoking status: Every Day    Packs/day: 1.50    Years: 49.00    Total pack years: 73.50    Types: Cigarettes   Smokeless tobacco: Never  Vaping Use   Vaping Use: Never used  Substance and Sexual Activity   Alcohol use: No   Drug use: No   Sexual activity: Not on file  Other Topics Concern   Not  on file  Social History Narrative   Not on file   Social Determinants of Health   Financial Resource Strain: Low Risk  (08/24/2021)   Overall Financial Resource Strain (CARDIA)    Difficulty of Paying Living Expenses: Not hard at all  Food Insecurity: No Food Insecurity (08/24/2021)   Hunger Vital Sign    Worried About Running Out of Food in the Last Year: Never true    Ran Out of Food in the Last Year: Never true  Transportation Needs: No Transportation Needs (08/24/2021)   PRAPARE - Hydrologist (Medical): No    Lack of Transportation (Non-Medical): No  Physical Activity: Inactive (08/24/2021)   Exercise Vital Sign    Days of Exercise per Week: 0 days    Minutes of Exercise per Session: 0 min  Stress: No Stress Concern Present (08/24/2021)   [place]    Feeling of Stress : Not at all  Social Connections: Socially Isolated (08/24/2021)   Social Connection and Isolation Panel [NHANES]    Frequency of Communication with Friends and Family: More than three times a week    Frequency of Social Gatherings with Friends and Family: More than three times a week    Attends Religious Services: Never    Marine scientist or Organizations: No    Attends Archivist Meetings: Never    Marital Status: Widowed    Tobacco Counseling Ready to quit: Not Answered Counseling given: Not Answered   Clinical Intake:  Pre-visit preparation completed: Yes  Pain : No/denies pain     Diabetes: No  How often do you need to have someone help you when you read instructions, pamphlets, or other written materials from your doctor or pharmacy?: 1 - Never  Diabetic? No  Interpreter Needed?: No  Information entered by :: Beatris Ship, [place]   Activities of Daily Living    08/30/2022    9:05 AM  In your present state of health, do you have any difficulty performing the following activities:   Hearing? 0  Vision? 1  Comment left eye blurry  Difficulty concentrating or making decisions? 0  Walking or climbing stairs? 0  Dressing or bathing? 0  Doing errands, shopping? 0  Preparing Food and eating ? N  Using the Toilet? N  In the past six months, have you accidently leaked urine? N  Do you have problems with loss of bowel control? Y  Comment depends on diet  Managing your Medications? N  Managing your Finances? N  Housekeeping or managing your Housekeeping? Y  Comment someone comes to clean his house    Patient Care  Team: Elise Benne as PCP - General (Internal Medicine) Elsie Stain, MD as Attending Physician (Pulmonary Disease)  Indicate any recent Medical Services you may have received from other than [facility] providers in the past year (date may be approximate).     Assessment:   This is a routine wellness examination for Jaman.  Hearing/Vision screen No results found.  Dietary issues and exercise activities discussed: Current Exercise Habits: The patient does not participate in regular exercise at present, Exercise limited by: respiratory conditions(s)   Goals Addressed             This Visit's Progress    Patient Stated   On track    Cut back on salt intake       Depression Screen    08/30/2022    9:02 AM 08/24/2021    8:27 AM 05/17/2021    8:06 AM 01/12/2020    3:26 PM 05/23/2018    9:14 AM 05/09/2018    8:55 AM 07/02/2015    1:10 PM  PHQ 2/9 Scores  PHQ - 2 Score 0 0 0 0 0 0 0  PHQ- 9 Score    0       Fall Risk    08/30/2022    9:02 AM 08/24/2021    8:26 AM 05/23/2018    9:14 AM 05/09/2018    8:55 AM  Fall Risk   Falls in the past year? 0 0 No No  Number falls in past yr: 0 0    Injury with Fall? 0 0    Risk for fall due to : No Fall Risks     Follow up Falls evaluation completed Falls prevention discussed      FALL RISK PREVENTION PERTAINING TO THE HOME:  Any stairs in or around the home? Yes  If so, are there any  without handrails? No  Home free of loose throw rugs in walkways, pet beds, electrical cords, etc? Yes  Adequate lighting in your home to reduce risk of falls? Yes   ASSISTIVE DEVICES UTILIZED TO PREVENT FALLS:  Life alert? Yes  Use of a cane, walker or w/c? No  Grab bars in the bathroom? No  Shower chair or bench in shower? No  Elevated toilet seat or a handicapped toilet? No    Cognitive Function:        08/30/2022    9:12 AM 08/24/2021    8:33 AM  6CIT Screen  What Year? 0 points 0 points  What month? 0 points 0 points  What time? 0 points 0 points  Count back from 20 2 points 0 points  Months in reverse 4 points 4 points  Repeat phrase 6 points 0 points  Total Score 12 points 4 points    Immunizations Immunization History  Administered Date(s) Administered   Fluad Quad(high Dose 65+) 09/06/2020, 09/19/2021   Hepatitis A, Adult 05/23/2018   Hepatitis B, adult 05/23/2018   Influenza Whole 10/23/2012   Influenza,inj,Quad PF,6+ Mos 10/05/2017, 09/12/2018, 10/08/2019, 09/23/2020   Influenza-Unspecified 09/05/2016   Moderna Sars-Covid-2 Vaccination 03/09/2020, 04/06/2020   Pneumococcal Conjugate-13 10/30/2017   Pneumococcal Polysaccharide-23 03/05/2018   Tdap 11/13/2018, 05/17/2020    TDAP status: Up to date  Flu Vaccine status: Up to date  Pneumococcal vaccine status: Up to date  Covid-19 vaccine status: Information provided on how to obtain vaccines.   Qualifies for Shingles Vaccine? Yes   Zostavax completed No   Shingrix Completed?: No.    Education has been provided regarding  the importance of this vaccine. Patient has been advised to call insurance company to determine out of pocket expense if they have not yet received this vaccine. Advised may also receive vaccine at local pharmacy or Health Dept. Verbalized acceptance and understanding.  Screening Tests Health Maintenance  Topic Date Due   HIV Screening  Never done   Zoster Vaccines- Shingrix (1 of 2)  Never done   COVID-19 Vaccine (3 - Moderna risk series) 05/04/2020   COLONOSCOPY (Pts 45-35yr Insurance coverage will need to be confirmed)  02/07/2022   INFLUENZA VACCINE  07/25/2022   Pneumonia Vaccine 65 Years old (3 - PPSV23 or PCV20) 03/06/2023   TETANUS/TDAP  05/17/2030   Hepatitis C Screening  Completed   HPV VACCINES  Aged Out    Health Maintenance  Health Maintenance Due  Topic Date Due   HIV Screening  Never done   Zoster Vaccines- Shingrix (1 of 2) Never done   COVID-19 Vaccine (3 - Moderna risk series) 05/04/2020   COLONOSCOPY (Pts 45-49yrInsurance coverage will need to be confirmed)  02/07/2022   INFLUENZA VACCINE  07/25/2022    Colorectal cancer screening: Type of screening: Colonoscopy. Completed 02/07/21. Repeat every 1 years  Lung Cancer Screening: (Low Dose CT Chest recommended if Age 15100-80ears, 30 pack-year currently smoking OR have quit w/in 15years.) does qualify.   Lung Cancer Screening Referral: Pulmonology manages  Additional Screening:  Hepatitis C Screening: does qualify; Completed 05/23/18  Vision Screening: Recommended annual ophthalmology exams for early detection of glaucoma and other disorders of the eye. Is the patient up to date with their annual eye exam?  No  Who is the provider or what is the name of the office in which the patient attends annual eye exams? Pt doesn't remember name of eye docto, only had one visit with him If pt is not established with a provider, would they like to be referred to a provider to establish care? No .   Dental Screening: Recommended annual dental exams for proper oral hygiene  Community Resource Referral / Chronic Care Management: CRR required this visit?  No   CCM required this visit?  No      Plan:     I have personally reviewed and noted the following in the patient's chart:   Medical and social history Use of alcohol, tobacco or illicit drugs  Current medications and supplements including  opioid prescriptions. Patient is not currently taking opioid prescriptions. Functional ability and status Nutritional status Physical activity Advanced directives List of other physicians Hospitalizations, surgeries, and ER visits in previous 12 months Vitals Screenings to include cognitive, depression, and falls Referrals and appointments  In addition, I have reviewed and discussed with patient certain preventive protocols, quality metrics, and best practice recommendations. A written personalized care plan for preventive services as well as general preventive health recommendations were provided to patient.     BeBeatris ShipCMMartha 08/30/2022   Nurse Notes: None   Review and Agree with assessment & plan of CMA  EdGeneral MotorsPA-C

## 2022-09-08 ENCOUNTER — Ambulatory Visit (INDEPENDENT_AMBULATORY_CARE_PROVIDER_SITE_OTHER): Payer: Medicare Other | Admitting: Medical

## 2022-09-08 VITALS — BP 120/66 | HR 96 | Temp 98.0°F | Resp 18 | Ht 69.0 in | Wt 199.0 lb

## 2022-09-08 DIAGNOSIS — Z79899 Other long term (current) drug therapy: Secondary | ICD-10-CM

## 2022-09-08 DIAGNOSIS — E785 Hyperlipidemia, unspecified: Secondary | ICD-10-CM | POA: Diagnosis not present

## 2022-09-08 DIAGNOSIS — I10 Essential (primary) hypertension: Secondary | ICD-10-CM

## 2022-09-08 DIAGNOSIS — F17200 Nicotine dependence, unspecified, uncomplicated: Secondary | ICD-10-CM

## 2022-09-08 DIAGNOSIS — Z23 Encounter for immunization: Secondary | ICD-10-CM | POA: Diagnosis not present

## 2022-09-08 DIAGNOSIS — F419 Anxiety disorder, unspecified: Secondary | ICD-10-CM

## 2022-09-08 DIAGNOSIS — J441 Chronic obstructive pulmonary disease with (acute) exacerbation: Secondary | ICD-10-CM | POA: Diagnosis not present

## 2022-09-08 DIAGNOSIS — R5383 Other fatigue: Secondary | ICD-10-CM

## 2022-09-08 LAB — TSH: TSH: 2.11 u[IU]/mL (ref 0.35–5.50)

## 2022-09-08 LAB — T4, FREE: Free T4: 0.74 ng/dL (ref 0.60–1.60)

## 2022-09-08 LAB — CBC WITH DIFFERENTIAL/PLATELET
Basophils Absolute: 0.1 10*3/uL (ref 0.0–0.1)
Basophils Relative: 0.7 % (ref 0.0–3.0)
Eosinophils Absolute: 0.2 10*3/uL (ref 0.0–0.7)
Eosinophils Relative: 1.5 % (ref 0.0–5.0)
HCT: 46.3 % (ref 39.0–52.0)
Hemoglobin: 15.5 g/dL (ref 13.0–17.0)
Lymphocytes Relative: 29.3 % (ref 12.0–46.0)
Lymphs Abs: 3 10*3/uL (ref 0.7–4.0)
MCHC: 33.3 g/dL (ref 30.0–36.0)
MCV: 84.4 fl (ref 78.0–100.0)
Monocytes Absolute: 0.6 10*3/uL (ref 0.1–1.0)
Monocytes Relative: 5.7 % (ref 3.0–12.0)
Neutro Abs: 6.4 10*3/uL (ref 1.4–7.7)
Neutrophils Relative %: 62.8 % (ref 43.0–77.0)
Platelets: 177 10*3/uL (ref 150.0–400.0)
RBC: 5.49 Mil/uL (ref 4.22–5.81)
RDW: 14.7 % (ref 11.5–15.5)
WBC: 10.3 10*3/uL (ref 4.0–10.5)

## 2022-09-08 LAB — COMPREHENSIVE METABOLIC PANEL
ALT: 15 U/L (ref 0–53)
AST: 12 U/L (ref 0–37)
Albumin: 4.3 g/dL (ref 3.5–5.2)
Alkaline Phosphatase: 130 U/L — ABNORMAL HIGH (ref 39–117)
BUN: 19 mg/dL (ref 6–23)
CO2: 29 mEq/L (ref 19–32)
Calcium: 9 mg/dL (ref 8.4–10.5)
Chloride: 104 mEq/L (ref 96–112)
Creatinine, Ser: 1.1 mg/dL (ref 0.40–1.50)
GFR: 70.51 mL/min (ref >60.00)
Glucose, Bld: 74 mg/dL (ref 70–99)
Potassium: 4.2 mEq/L (ref 3.5–5.1)
Sodium: 142 mEq/L (ref 135–145)
Total Bilirubin: 0.5 mg/dL (ref 0.2–1.2)
Total Protein: 7.4 g/dL (ref 6.0–8.3)

## 2022-09-08 LAB — VITAMIN B12: Vitamin B-12: 164 pg/mL — ABNORMAL LOW (ref 211–911)

## 2022-09-08 MED ORDER — LORAZEPAM 0.5 MG PO TABS: 0.5000 mg | ORAL_TABLET | Freq: Two times a day (BID) | ORAL | 2 refills | Status: DC

## 2022-09-08 NOTE — Patient Instructions (Signed)
Anxiety and depression. More anxiety and presently both controlled. Continue celexa and ativan. Up  date contract and uds today. Refill ativan today   Copd stable. Continue trelegy and please call your pulmonologist to make follow up appt.   Hyperlipidemia. Get cmp and lipid panel today. Continue atorvastatin.   Htn- bp controlled. Continue losartan 50 mg daily and amlodipine 5 mg daily.    Elevated psa. Continue with tx per urologist.  For fatigue gettng cbc, tsh, t4 and b12 level.  Flu vaccine today.  Follow up in 3 month or sooner if needed.

## 2022-09-08 NOTE — Progress Notes (Signed)
Subjective:    Patient ID: Trevor Mack, male    DOB: 07-01-1956, 66 y.o.   MRN: [id number]  HPI   Pt anxiety is controlled with ativan. uds and contract up to date but about to need renewal by end of this month..  Contract and uds up to date.   Some mild depression. He is celexa and states mood is not bad.     Pt has copd. He is still on trelegy and sees pulmonologist. "Chronic obstructive lung disease with acute on chronic bronchitis and exacerbation gold stage C    Saw pulmonologist in March.    Htn- pt bp well controlled today. He is on losartan 50 mg daily and amlodipine 5 mg daily.    High cholesterol on atorvastatin.   Pt has seen urologist since last visit. Pt states told has bph and given tab for that.    Review of Systems  Constitutional:  Positive for fatigue. Negative for chills and fever.  HENT:  Negative for congestion.   Respiratory:  Negative for cough, chest tightness, shortness of breath and wheezing.   Cardiovascular:  Negative for chest pain and palpitations.  Gastrointestinal:  Negative for abdominal pain.  Musculoskeletal:  Negative for back pain and myalgias.  Skin:  Negative for rash.  Neurological:  Negative for dizziness, seizures, syncope, weakness and light-headedness.  Hematological:  Negative for adenopathy. Does not bruise/bleed easily.  Psychiatric/Behavioral:  Negative for behavioral problems, dysphoric mood and hallucinations.     Past Medical History:  Diagnosis Date   ANXIETY 07/23/2008   COLONIC POLYPS, HX OF 03/26/2006   MULTIPLE FRAGMENTS OF TUBULAR ADENOMAS POLYPS   COPD (chronic obstructive pulmonary disease) ([place])    Diverticulosis    Family history of colon cancer    Father    GERD 07/23/2008   Hepatitis C    HYPERLIPIDEMIA 07/23/2008   HYPERTENSION 07/23/2008   TENDINITIS, LEFT THUMB 07/15/2010     Social History   Socioeconomic History   Marital status: Widowed    Spouse name: Not on file   Number of children: 0    Years of education: Not on file   Highest education level: Not on file  Occupational History    Employer: [employer]  Tobacco Use   Smoking status: Every Day    Packs/day: 1.50    Years: 49.00    Total pack years: 73.50    Types: Cigarettes   Smokeless tobacco: Never  Vaping Use   Vaping Use: Never used  Substance and Sexual Activity   Alcohol use: No   Drug use: No   Sexual activity: Not on file  Other Topics Concern   Not on file  Social History Narrative   Not on file   Social Determinants of Health   Financial Resource Strain: Low Risk  (08/24/2021)   Overall Financial Resource Strain (CARDIA)    Difficulty of Paying Living Expenses: Not hard at all  Food Insecurity: No Food Insecurity (08/24/2021)   Hunger Vital Sign    Worried About Running Out of Food in the Last Year: Never true    Ran Out of Food in the Last Year: Never true  Transportation Needs: No Transportation Needs (08/24/2021)   PRAPARE - Hydrologist (Medical): No    Lack of Transportation (Non-Medical): No  Physical Activity: Inactive (08/24/2021)   Exercise Vital Sign    Days of Exercise per Week: 0 days    Minutes of Exercise per  Session: 0 min  Stress: No Stress Concern Present (08/24/2021)   [place]    Feeling of Stress : Not at all  Social Connections: Socially Isolated (08/24/2021)   Social Connection and Isolation Panel [NHANES]    Frequency of Communication with Friends and Family: More than three times a week    Frequency of Social Gatherings with Friends and Family: More than three times a week    Attends Religious Services: Never    Marine scientist or Organizations: No    Attends Archivist Meetings: Never    Marital Status: Widowed  Intimate Partner Violence: Not At Risk (08/24/2021)   Humiliation, Afraid, Rape, and Kick questionnaire    Fear of Current or Ex-Partner:  No    Emotionally Abused: No    Physically Abused: No    Sexually Abused: No    Past Surgical History:  Procedure Laterality Date   COLONOSCOPY  11/27/2012   LUMBAR LAMINECTOMY      Family History  Problem Relation Age of Onset   Colon cancer Father 53   Hypertension Father    Diabetes Father    Heart attack Father    Colon polyps Neg Hx    Esophageal cancer Neg Hx    Rectal cancer Neg Hx    Stomach cancer Neg Hx     No Known Allergies  Current Outpatient Medications on File Prior to Visit  Medication Sig Dispense Refill   albuterol (PROVENTIL) (2.5 MG/3ML) 0.083% nebulizer solution Take 3 mLs (2.5 mg total) by nebulization every 6 (six) hours as needed for wheezing or shortness of breath. Dx code: J44.9 75 mL 11   albuterol (VENTOLIN HFA) 108 (90 Base) MCG/ACT inhaler INHALE 1 TO 2 PUFFS BY MOUTH EVERY 6 HOURS AS NEEDED 9 g 2   amLODipine (NORVASC) 5 MG tablet Take 1 tablet by mouth once daily 90 tablet 0   atorvastatin (LIPITOR) 40 MG tablet Take 1 tablet by mouth once daily 90 tablet 0   citalopram (CELEXA) 40 MG tablet Take 1 tablet by mouth once daily 90 tablet 0   ibuprofen (ADVIL) 200 MG tablet Take 400 mg by mouth every 6 (six) hours as needed.     ketoconazole (NIZORAL) 2 % cream Apply 1 application topically daily. 15 g 0   LORazepam (ATIVAN) 0.5 MG tablet Take 1 tablet by mouth twice daily 60 tablet 0   losartan (COZAAR) 50 MG tablet Take 1 tablet by mouth once daily 90 tablet 0   omeprazole (PRILOSEC) 40 MG capsule Take 1 capsule (40 mg total) by mouth daily. 90 capsule 3   tamsulosin (FLOMAX) 0.4 MG CAPS capsule Take 1 capsule (0.4 mg total) by mouth daily. 90 capsule 3   TRELEGY ELLIPTA 100-62.5-25 MCG/ACT AEPB INHALE 1 PUFF ONCE DAILY 60 each 4   No current facility-administered medications on file prior to visit.    BP 120/66   Pulse 96   Temp 98 F (36.7 C)   Resp 18   Ht '5\' 9"'$  (1.753 m)   Wt 199 lb (90.3 kg)   SpO2 95%   BMI 29.39 kg/m         Objective:   Physical Exam  General Mental Status- Alert. General Appearance- Not in acute distress.   Skin General: Color- Normal Color. Moisture- Normal Moisture.  Neck Carotid Arteries- Normal color. Moisture- Normal Moisture. No carotid bruits. No JVD.  Chest and Lung Exam Equal and symmetric  but some shallow and faint wheeze.(Baseline condition/see copd)   Cardiovascular Auscultation:Rythm- Regular. Murmurs & Other Heart Sounds:Auscultation of the heart reveals- No Murmurs.  Abdomen Inspection:-Inspeection Normal. Palpation/Percussion:Note:No mass. Palpation and Percussion of the abdomen reveal- Non Tender, Non Distended + BS, no rebound or guarding.   Neurologic Cranial Nerve exam:- CN III-XII intact(No nystagmus), symmetric smile. Strength:- 5/5 equal and symmetric strength both upper and lower extremities.       Assessment & Plan:   Anxiety and depression. More anxiety and presently both controlled. Continue celexa and ativan. Up  date contract and uds today. Refill ativan today   Copd stable. Continue trelegy and please call your pulmonologist to make follow up appt.   Hyperlipidemia. Get cmp and lipid panel today. Continue atorvastatin.   Htn- bp controlled. Continue losartan 50 mg daily and amlodipine 5 mg daily.    Elevated psa. Continue with tx per urologist.  For fatigue gettng cbc, tsh, t4 and b12 level.  Flu vaccine today.  Follow up in 3 month or sooner if needed.  Mackie Pai, PA-C

## 2022-09-12 LAB — DRUG MONITORING PANEL 376104, URINE
Alphahydroxyalprazolam: NEGATIVE ng/mL (ref <25)
Alphahydroxymidazolam: NEGATIVE ng/mL (ref <50)
Alphahydroxytriazolam: NEGATIVE ng/mL (ref <50)
Aminoclonazepam: NEGATIVE ng/mL (ref <25)
Amphetamines: NEGATIVE ng/mL (ref <500)
Barbiturates: NEGATIVE ng/mL (ref <300)
Benzodiazepines: POSITIVE ng/mL — AB (ref <100)
Cocaine Metabolite: NEGATIVE ng/mL (ref <150)
Desmethyltramadol: NEGATIVE ng/mL (ref <100)
Hydroxyethylflurazepam: NEGATIVE ng/mL (ref <50)
Lorazepam: 698 ng/mL — ABNORMAL HIGH (ref <50)
Nordiazepam: NEGATIVE ng/mL (ref <50)
Opiates: NEGATIVE ng/mL (ref <100)
Oxazepam: NEGATIVE ng/mL (ref <50)
Oxycodone: NEGATIVE ng/mL (ref <100)
Temazepam: NEGATIVE ng/mL (ref <50)
Tramadol: NEGATIVE ng/mL (ref <100)

## 2022-09-12 LAB — DM TEMPLATE

## 2022-09-13 ENCOUNTER — Telehealth: Payer: Self-pay | Admitting: Medical

## 2022-09-13 MED ORDER — LORAZEPAM 0.5 MG PO TABS: 0.5000 mg | ORAL_TABLET | Freq: Two times a day (BID) | ORAL | 2 refills | Status: DC

## 2022-09-13 NOTE — Telephone Encounter (Signed)
Pt called stating that he needed to have his lorazepam rerouted to the following pharmacy due to [facility] being on backorder. May need to call pharmacy to verify their ability to fill it. Pt would like a call back when info is available regarding this matter:  [facility] [address], [place], [place] [address] P: [phone]

## 2022-09-13 NOTE — Telephone Encounter (Addendum)
Medication has been on back [facility] at [facility]  Medication cancelled at [facility] uploaded   New rx resent to pharmacy.  Mackie Pai, PA-C

## 2022-09-14 NOTE — Telephone Encounter (Signed)
Pt.notified

## 2022-09-29 ENCOUNTER — Other Ambulatory Visit: Payer: Self-pay | Admitting: *Deleted

## 2022-09-29 MED ORDER — TRELEGY ELLIPTA 100-62.5-25 MCG/ACT IN AEPB: 1.0000 | INHALATION_SPRAY | Freq: Every day | RESPIRATORY_TRACT | 4 refills | Status: DC

## 2022-09-29 MED ORDER — ALBUTEROL SULFATE HFA 108 (90 BASE) MCG/ACT IN AERS: 1.0000 | INHALATION_SPRAY | Freq: Four times a day (QID) | RESPIRATORY_TRACT | 5 refills | Status: DC | PRN

## 2022-10-13 ENCOUNTER — Encounter: Payer: Self-pay | Admitting: *Deleted

## 2022-10-19 ENCOUNTER — Telehealth: Payer: Self-pay | Admitting: Medical

## 2022-10-19 MED ORDER — CITALOPRAM HYDROBROMIDE 40 MG PO TABS: 40.0000 mg | ORAL_TABLET | Freq: Every day | ORAL | 0 refills | Status: DC

## 2022-10-19 NOTE — Telephone Encounter (Signed)
Medication: citalopram (CELEXA) 40 MG tablet   Has the patient contacted their pharmacy? No.  Preferred Pharmacy (with phone number or street name):  [facility][id number] - [place], [place] - [address], [place] [address] Phone: [phone]  Fax: [phone]

## 2022-10-19 NOTE — Telephone Encounter (Signed)
Rx sent 

## 2022-10-20 MED ORDER — CITALOPRAM HYDROBROMIDE 40 MG PO TABS: 40.0000 mg | ORAL_TABLET | Freq: Every day | ORAL | 0 refills | Status: DC

## 2022-10-20 NOTE — Addendum Note (Signed)
Addended by: Jeronimo Greaves on: 10/20/2022 01:37 PM   Modules accepted: Orders

## 2022-10-20 NOTE — Telephone Encounter (Signed)
RX RSENT

## 2022-10-20 NOTE — Telephone Encounter (Signed)
[facility].   [facility][id number] - [place], [place] - [address] AT [place] [address], [place] [address] Phone: [phone]  Fax: [phone]

## 2022-10-24 ENCOUNTER — Telehealth: Payer: Self-pay | Admitting: Medical

## 2022-10-24 MED ORDER — ATORVASTATIN CALCIUM 40 MG PO TABS: 40.0000 mg | ORAL_TABLET | Freq: Every day | ORAL | 0 refills | Status: DC

## 2022-10-24 NOTE — Telephone Encounter (Signed)
Rx sent 

## 2022-10-24 NOTE — Telephone Encounter (Signed)
Pt would also like to have the [facility] removed and the following pharmacy used as his default going forward:  Medication:   atorvastatin (LIPITOR) 40 MG tablet [[id number]]   Has the patient contacted their pharmacy? No. (If no, request that the patient contact the pharmacy for the refill.) (If yes, when and what did the pharmacy advise?)  Preferred Pharmacy (with phone number or street name):   [facility] #[id number] - [place], [place] - [address] AT [place] [address] [place] [address], [place] [address] Phone: [phone]  Fax: [phone]    Agent: Please be advised that RX refills may take up to 3 business days. We ask that you follow-up with your pharmacy.

## 2022-10-25 MED ORDER — ATORVASTATIN CALCIUM 40 MG PO TABS: 40.0000 mg | ORAL_TABLET | Freq: Every day | ORAL | 0 refills | Status: DC

## 2022-10-25 NOTE — Telephone Encounter (Signed)
Script cancelled at [facility] Pt notifed

## 2022-10-25 NOTE — Telephone Encounter (Signed)
Rx sent to [facility]

## 2022-10-25 NOTE — Telephone Encounter (Signed)
Patient states [facility] will not let him get his medication because [facility] has the prescriptions as well. He needs the prescription that was [facility] to [facility] to be canceled. Please advise.

## 2022-10-25 NOTE — Addendum Note (Signed)
Addended by: Jeronimo Greaves on: 10/25/2022 10:21 AM   Modules accepted: Orders

## 2022-10-25 NOTE — Telephone Encounter (Signed)
Please change pharmacy from [facility] to [facility]. Patient is no longer using [facility]. Please update record to reflect [facility].   Please cancel prescription at [facility] and send refill for Atorvastatin to:     [facility] #[id number] - [place], [place] - [address] AT [place] [address], [place] [address] Phone: [phone]  Fax: [phone]

## 2022-10-25 NOTE — Addendum Note (Signed)
Addended by: Jeronimo Greaves on: 10/25/2022 11:32 AM   Modules accepted: Orders

## 2022-10-26 ENCOUNTER — Telehealth: Payer: Self-pay

## 2022-10-26 MED ORDER — ATORVASTATIN CALCIUM 40 MG PO TABS: 40.0000 mg | ORAL_TABLET | Freq: Every day | ORAL | 0 refills | Status: DC

## 2022-10-26 NOTE — Telephone Encounter (Signed)
[facility] #[id number] - [place], [place] - [address] AT [place] [address], [place] [address] Phone: [phone]  Fax: [phone]

## 2022-10-26 NOTE — Telephone Encounter (Signed)
Caller Name Jamail Cullers Caller Phone Number [phone] Patient Name Trevor Mack Patient DOB 14-Mar-1956 Call Type Message Only Information Provided Reason for Call Medication Question / Request Initial Comment Caller is requesting rx refill. Caller states rx was sent to wrong location. He spoke with the pharmacy and was told rx is not at the location. Additional Comment Office hours provided. Disp. Time Disposition Final User 10/26/2022 12:30:07 PM General Information Provided Yes Whitman Hero Call Closed By: Whitman Hero Transaction Date/Time: 10/26/2022 12:24:18 PM (ET)

## 2022-10-26 NOTE — Telephone Encounter (Signed)
Rx sent 

## 2022-10-26 NOTE — Telephone Encounter (Signed)
Pt states pharmacy told him today they do not have rx. Please advise.

## 2022-10-26 NOTE — Addendum Note (Signed)
Addended by: Jeronimo Greaves on: 10/26/2022 02:00 PM   Modules accepted: Orders

## 2022-10-27 NOTE — Telephone Encounter (Signed)
See other telephone notes.

## 2022-11-29 ENCOUNTER — Telehealth: Payer: Self-pay | Admitting: Medical

## 2022-11-29 MED ORDER — LOSARTAN POTASSIUM 50 MG PO TABS: 50.0000 mg | ORAL_TABLET | Freq: Every day | ORAL | 1 refills | Status: DC

## 2022-11-29 MED ORDER — AMLODIPINE BESYLATE 5 MG PO TABS: 5.0000 mg | ORAL_TABLET | Freq: Every day | ORAL | 1 refills | Status: DC

## 2022-11-29 NOTE — Telephone Encounter (Signed)
Left message on machine that rxs have been sent into [facility]

## 2022-11-29 NOTE — Telephone Encounter (Signed)
Prescription Request  11/29/2022  Is this a "Controlled Substance" medicine? Yes  LOV: 09/08/2022  What is the name of the medication or equipment?  1.losartan (COZAAR) 50 MG tablet  2.amLODipine (NORVASC) 5 MG tablet   Have you contacted your pharmacy to request a refill? No   Which pharmacy would you like this sent to?  [facility] #[id number] - [place], [place] - [address] AT [place] [address], [place] [address] Phone: [phone]  Fax: [phone]   Patient notified that their request is being sent to the clinical staff for review and that they should receive a response within 2 business days.   Please advise at Mobile [phone] (mobile)

## 2022-12-08 ENCOUNTER — Encounter: Payer: Self-pay | Admitting: Medical

## 2022-12-08 ENCOUNTER — Ambulatory Visit (INDEPENDENT_AMBULATORY_CARE_PROVIDER_SITE_OTHER): Payer: Medicare Other | Admitting: Medical

## 2022-12-08 VITALS — BP 120/59 | HR 84 | Temp 98.1°F | Resp 16 | Ht 69.0 in | Wt 203.2 lb

## 2022-12-08 DIAGNOSIS — I10 Essential (primary) hypertension: Secondary | ICD-10-CM

## 2022-12-08 DIAGNOSIS — F419 Anxiety disorder, unspecified: Secondary | ICD-10-CM

## 2022-12-08 DIAGNOSIS — E538 Deficiency of other specified B group vitamins: Secondary | ICD-10-CM | POA: Diagnosis not present

## 2022-12-08 DIAGNOSIS — J441 Chronic obstructive pulmonary disease with (acute) exacerbation: Secondary | ICD-10-CM | POA: Diagnosis not present

## 2022-12-08 DIAGNOSIS — R972 Elevated prostate specific antigen [PSA]: Secondary | ICD-10-CM

## 2022-12-08 DIAGNOSIS — E785 Hyperlipidemia, unspecified: Secondary | ICD-10-CM

## 2022-12-08 MED ORDER — CYANOCOBALAMIN 1000 MCG/ML IJ SOLN
1000.0000 ug | Freq: Once | INTRAMUSCULAR | Status: AC
  Administered 2022-12-08: 1000 ug via INTRAMUSCULAR

## 2022-12-08 NOTE — Patient Instructions (Addendum)
Anxiety and depression. More anxiety and presently both controlled. Continue celexa and ativan. Up  date contract and uds today. Refill ativan today   Copd stable. Continue trelegy and please call keep upcoming appointment.   Hyperlipidemia. Get cmp and lipid panel today. Continue atorvastatin.   Htn- bp controlled. Continue losartan 50 mg daily and amlodipine 5 mg daily.    Elevated psa. Continue with tx per urologist.   For low b12 this is likely cause of your fatigue.  Recommend getting set up for weekly b12 vitamin injection over next 4 weeks followed by monthly injection for 5 months. Then repeat b12 level in 6 months. Hopefully you will see improvement in your energy as b12 increased.   Discussed and advised RSV vaccine  Follow up in 3 months or sooner if needed.

## 2022-12-08 NOTE — Addendum Note (Signed)
Addended by: Sena Hitch on: 12/08/2022 08:34 AM   Modules accepted: Orders

## 2022-12-08 NOTE — Progress Notes (Signed)
Subjective:    Patient ID: Trevor Mack, male    DOB: 08/30/1956, 66 y.o.   MRN: [id number]  HPI  Pt states no changes. If takes med feeling well.  Only complaint is fatigue which was worked up last time. He had low b12. Advised to come in but just now in 3 months later.  Pt anxiety is controlled with ativan. uds and contract up to date but about to need renewal by end of this month..  Contract and uds up to date.   Some mild depression. He is celexa and states mood is not bad.    Next appt will be in January. Pt has copd. He is still on trelegy and sees pulmonologist. "Chronic obstructive lung disease with acute on chronic bronchitis and exacerbation gold stage C    Saw pulmonologist in March.    Htn- pt bp well controlled today. He is on losartan 50 mg daily and amlodipine 5 mg daily.    High cholesterol on atorvastatin.     Pt has seen urologist since last visit. Pt states told has bph and given tab for that.   Review of Systems  Constitutional:  Positive for fatigue. Negative for chills.  HENT:  Negative for congestion.   Respiratory:  Negative for cough, chest tightness, wheezing and stridor.   Cardiovascular:  Negative for chest pain and palpitations.  Gastrointestinal:  Negative for abdominal pain.  Genitourinary:  Negative for dysuria.  Musculoskeletal:  Negative for back pain.  Skin:  Negative for rash.  Neurological:  Negative for dizziness, seizures, facial asymmetry and light-headedness.  Hematological:  Negative for adenopathy. Does not bruise/bleed easily.  Psychiatric/Behavioral:  Negative for behavioral problems and confusion. The patient is not nervous/anxious.     Past Medical History:  Diagnosis Date   ANXIETY 07/23/2008   COLONIC POLYPS, HX OF 03/26/2006   MULTIPLE FRAGMENTS OF TUBULAR ADENOMAS POLYPS   COPD (chronic obstructive pulmonary disease) ([place])    Diverticulosis    Family history of colon cancer    Father    GERD 07/23/2008   Hepatitis  C    HYPERLIPIDEMIA 07/23/2008   HYPERTENSION 07/23/2008   TENDINITIS, LEFT THUMB 07/15/2010     Social History   Socioeconomic History   Marital status: Widowed    Spouse name: Not on file   Number of children: 0   Years of education: Not on file   Highest education level: Not on file  Occupational History    Employer: [employer]  Tobacco Use   Smoking status: Every Day    Packs/day: 1.50    Years: 49.00    Total pack years: 73.50    Types: Cigarettes   Smokeless tobacco: Never  Vaping Use   Vaping Use: Never used  Substance and Sexual Activity   Alcohol use: No   Drug use: No   Sexual activity: Not on file  Other Topics Concern   Not on file  Social History Narrative   Not on file   Social Determinants of Health   Financial Resource Strain: Low Risk  (08/24/2021)   Overall Financial Resource Strain (CARDIA)    Difficulty of Paying Living Expenses: Not hard at all  Food Insecurity: No Food Insecurity (08/24/2021)   Hunger Vital Sign    Worried About Running Out of Food in the Last Year: Never true    Ran Out of Food in the Last Year: Never true  Transportation Needs: No Transportation Needs (08/24/2021)   PRAPARE -  Hydrologist (Medical): No    Lack of Transportation (Non-Medical): No  Physical Activity: Inactive (08/24/2021)   Exercise Vital Sign    Days of Exercise per Week: 0 days    Minutes of Exercise per Session: 0 min  Stress: No Stress Concern Present (08/24/2021)   [place]    Feeling of Stress : Not at all  Social Connections: Socially Isolated (08/24/2021)   Social Connection and Isolation Panel [NHANES]    Frequency of Communication with Friends and Family: More than three times a week    Frequency of Social Gatherings with Friends and Family: More than three times a week    Attends Religious Services: Never    Marine scientist or  Organizations: No    Attends Archivist Meetings: Never    Marital Status: Widowed  Intimate Partner Violence: Not At Risk (08/24/2021)   Humiliation, Afraid, Rape, and Kick questionnaire    Fear of Current or Ex-Partner: No    Emotionally Abused: No    Physically Abused: No    Sexually Abused: No    Past Surgical History:  Procedure Laterality Date   COLONOSCOPY  11/27/2012   LUMBAR LAMINECTOMY      Family History  Problem Relation Age of Onset   Colon cancer Father 36   Hypertension Father    Diabetes Father    Heart attack Father    Colon polyps Neg Hx    Esophageal cancer Neg Hx    Rectal cancer Neg Hx    Stomach cancer Neg Hx     No Known Allergies  Current Outpatient Medications on File Prior to Visit  Medication Sig Dispense Refill   albuterol (PROVENTIL) (2.5 MG/3ML) 0.083% nebulizer solution Take 3 mLs (2.5 mg total) by nebulization every 6 (six) hours as needed for wheezing or shortness of breath. Dx code: J44.9 75 mL 11   albuterol (VENTOLIN HFA) 108 (90 Base) MCG/ACT inhaler Inhale 1-2 puffs into the lungs every 6 (six) hours as needed for wheezing or shortness of breath. 9 g 5   amLODipine (NORVASC) 5 MG tablet Take 1 tablet (5 mg total) by mouth daily. 90 tablet 1   atorvastatin (LIPITOR) 40 MG tablet Take 1 tablet (40 mg total) by mouth daily. 90 tablet 0   citalopram (CELEXA) 40 MG tablet Take 1 tablet (40 mg total) by mouth daily. 90 tablet 0   Fluticasone-Umeclidin-Vilant (TRELEGY ELLIPTA) 100-62.5-25 MCG/ACT AEPB Inhale 1 puff into the lungs daily. 60 each 4   ibuprofen (ADVIL) 200 MG tablet Take 400 mg by mouth every 6 (six) hours as needed.     ketoconazole (NIZORAL) 2 % cream Apply 1 application topically daily. 15 g 0   LORazepam (ATIVAN) 0.5 MG tablet Take 1 tablet (0.5 mg total) by mouth 2 (two) times daily. 60 tablet 2   losartan (COZAAR) 50 MG tablet Take 1 tablet (50 mg total) by mouth daily. 90 tablet 1   omeprazole (PRILOSEC) 40 MG  capsule Take 1 capsule (40 mg total) by mouth daily. 90 capsule 3   tamsulosin (FLOMAX) 0.4 MG CAPS capsule Take 1 capsule (0.4 mg total) by mouth daily. 90 capsule 3   No current facility-administered medications on file prior to visit.    BP (!) 120/59 (BP Location: Right Arm, Patient Position: Sitting, Cuff Size: Large)   Pulse 84   Temp 98.1 F (36.7 C) (Oral)   Resp 16  Ht '5\' 9"'$  (1.753 m)   Wt 203 lb 3.2 oz (92.2 kg)   SpO2 95%   BMI 30.01 kg/m        Objective:   Physical Exam  General Mental Status- Alert. General Appearance- Not in acute distress.    Skin General: Color- Normal Color. Moisture- Normal Moisture.   Neck Carotid Arteries- Normal color. Moisture- Normal Moisture. No carotid bruits. No JVD.   Chest and Lung Exam Equal and symmetric but some shallow and faint wheeze.(Baseline condition/see copd)     Cardiovascular Auscultation:Rythm- Regular. Murmurs & Other Heart Sounds:Auscultation of the heart reveals- No Murmurs.   Abdomen Inspection:-Inspeection Normal. Palpation/Percussion:Note:No mass. Palpation and Percussion of the abdomen reveal- Non Tender, Non Distended + BS, no rebound or guarding.     Neurologic Cranial Nerve exam:- CN III-XII intact(No nystagmus), symmetric smile. Strength:- 5/5 equal and symmetric strength both upper and lower extremities.       Assessment & Plan:   Patient Instructions  Anxiety and depression. More anxiety and presently both controlled. Continue celexa and ativan. Up  date contract and uds today. Refill ativan today   Copd stable. Continue trelegy and please call keep upcoming appointment.   Hyperlipidemia. Get cmp and lipid panel today. Continue atorvastatin.   Htn- bp controlled. Continue losartan 50 mg daily and amlodipine 5 mg daily.    Elevated psa. Continue with tx per urologist.   For low b12 this is likely cause of your fatigue.  Recommend getting set up for weekly b12 vitamin injection over  next 4 weeks followed by monthly injection for 5 months. Then repeat b12 level in 6 months. Hopefully you will see improvement in your energy as b12 increased.   Discussed and advised RSV vaccine  Follow up in 3 months or sooner if needed.   Mackie Pai, PA-C

## 2022-12-11 ENCOUNTER — Other Ambulatory Visit: Payer: Self-pay | Admitting: Medical

## 2022-12-11 NOTE — Telephone Encounter (Signed)
Requesting: lorazepam 0.'5mg'$   Contract: 09/08/22 UDS: 09/08/22 Last Visit: 12/08/22 Next Visit: 03/05/23 Last Refill: 09/13/22 #60 and 0RF  Please Advise

## 2022-12-12 NOTE — Telephone Encounter (Signed)
Rx refill sent to pt pharmacy.  Jericho Cieslik, PA-C  

## 2022-12-14 ENCOUNTER — Ambulatory Visit: Payer: Medicare Other

## 2022-12-14 NOTE — Progress Notes (Deleted)
Trevor Mack is a 66 y.o. male presents to the office today for 1/4 weekly B12 injection per physician's orders. Original order: 12/08/22: "For low b12 this is likely cause of your fatigue. Recommend getting set up for weekly b12 vitamin injection over next 4 weeks followed by monthly injection for 5 months. Then repeat b12 level in 6 months."  Cyanocobalamin 1000 mg/ml administered today in ***  deltoid. Patient tolerated injection.  Patient due for follow up labs/provider appt: No.  Patient next injection due: 1 week, appt made {yes/no:20286}  [place], [place]

## 2022-12-21 ENCOUNTER — Ambulatory Visit (INDEPENDENT_AMBULATORY_CARE_PROVIDER_SITE_OTHER): Payer: Medicare Other | Admitting: *Deleted

## 2022-12-21 DIAGNOSIS — E538 Deficiency of other specified B group vitamins: Secondary | ICD-10-CM | POA: Diagnosis not present

## 2022-12-21 MED ORDER — CYANOCOBALAMIN 1000 MCG/ML IJ SOLN
1000.0000 ug | Freq: Once | INTRAMUSCULAR | Status: AC
  Administered 2022-12-21: 1000 ug via INTRAMUSCULAR

## 2022-12-21 NOTE — Progress Notes (Addendum)
Pt here for weekly B12 injection for 4 weeks per PCP.  B12 1071mg given IM, and pt tolerated injection well.  Next B12 injection scheduled for Dec 28, 2022.   EMackie Pai PA-C

## 2022-12-28 ENCOUNTER — Ambulatory Visit: Payer: Medicare Other

## 2023-01-04 ENCOUNTER — Ambulatory Visit (INDEPENDENT_AMBULATORY_CARE_PROVIDER_SITE_OTHER): Payer: PPO

## 2023-01-04 DIAGNOSIS — E538 Deficiency of other specified B group vitamins: Secondary | ICD-10-CM | POA: Diagnosis not present

## 2023-01-04 MED ORDER — CYANOCOBALAMIN 1000 MCG/ML IJ SOLN
1000.0000 ug | Freq: Once | INTRAMUSCULAR | Status: AC
  Administered 2023-01-04: 1000 ug via INTRAMUSCULAR

## 2023-01-04 NOTE — Progress Notes (Addendum)
ROMIE TAY is a 67 y.o. male presents to the office today for B12:  injections, per physician's orders. Original order: 09/08/2022 on labs to do b12 once a week for 4 weeks then monthly.  Cyanocobalamin (med), 1000 mg/ml (dose),  IM (route) was administered Left deltoid (location) today. Patient tolerated injection. Patient next injection due: in one week, appt made Yes for 01/11/23 for weekly B12 #4 of 4.   Jolene Provost Rennis Petty, PA-C

## 2023-01-11 ENCOUNTER — Ambulatory Visit (INDEPENDENT_AMBULATORY_CARE_PROVIDER_SITE_OTHER): Payer: PPO

## 2023-01-11 DIAGNOSIS — E538 Deficiency of other specified B group vitamins: Secondary | ICD-10-CM | POA: Diagnosis not present

## 2023-01-11 MED ORDER — CYANOCOBALAMIN 1000 MCG/ML IJ SOLN
1000.0000 ug | Freq: Once | INTRAMUSCULAR | Status: AC
  Administered 2023-01-11: 1000 ug via INTRAMUSCULAR

## 2023-01-11 NOTE — Progress Notes (Addendum)
Trevor Mack is a 67 y.o. male presents to the office today for B12 injections # 4 of 4 weekly, per physician's orders. Original order: : 09/08/2022 on labs to do b12 once a week for 4 weeks then monthly.  Cyanocobalamin (med), 1000 mg/ml (dose),  IM (route) was administered left deltoid (location) today.   Patient next injection due: in one month appt made Yes  Teresa Pelton, PA-C

## 2023-01-15 ENCOUNTER — Other Ambulatory Visit: Payer: Self-pay | Admitting: Medical

## 2023-01-17 DIAGNOSIS — H43393 Other vitreous opacities, bilateral: Secondary | ICD-10-CM | POA: Diagnosis not present

## 2023-01-17 DIAGNOSIS — H52203 Unspecified astigmatism, bilateral: Secondary | ICD-10-CM | POA: Diagnosis not present

## 2023-01-17 DIAGNOSIS — H2513 Age-related nuclear cataract, bilateral: Secondary | ICD-10-CM | POA: Diagnosis not present

## 2023-01-17 DIAGNOSIS — H524 Presbyopia: Secondary | ICD-10-CM | POA: Diagnosis not present

## 2023-01-17 DIAGNOSIS — H5213 Myopia, bilateral: Secondary | ICD-10-CM | POA: Diagnosis not present

## 2023-01-17 DIAGNOSIS — H25042 Posterior subcapsular polar age-related cataract, left eye: Secondary | ICD-10-CM | POA: Diagnosis not present

## 2023-01-17 DIAGNOSIS — H16223 Keratoconjunctivitis sicca, not specified as Sjogren's, bilateral: Secondary | ICD-10-CM | POA: Diagnosis not present

## 2023-01-22 DIAGNOSIS — H25042 Posterior subcapsular polar age-related cataract, left eye: Secondary | ICD-10-CM | POA: Diagnosis not present

## 2023-01-22 DIAGNOSIS — H524 Presbyopia: Secondary | ICD-10-CM | POA: Diagnosis not present

## 2023-01-22 DIAGNOSIS — H02051 Trichiasis without entropian right upper eyelid: Secondary | ICD-10-CM | POA: Diagnosis not present

## 2023-01-22 DIAGNOSIS — H2513 Age-related nuclear cataract, bilateral: Secondary | ICD-10-CM | POA: Diagnosis not present

## 2023-01-22 DIAGNOSIS — H52203 Unspecified astigmatism, bilateral: Secondary | ICD-10-CM | POA: Diagnosis not present

## 2023-01-22 DIAGNOSIS — H35033 Hypertensive retinopathy, bilateral: Secondary | ICD-10-CM | POA: Diagnosis not present

## 2023-01-24 ENCOUNTER — Other Ambulatory Visit: Payer: Self-pay | Admitting: Medical

## 2023-02-06 ENCOUNTER — Ambulatory Visit (INDEPENDENT_AMBULATORY_CARE_PROVIDER_SITE_OTHER): Payer: PPO | Admitting: *Deleted

## 2023-02-06 DIAGNOSIS — E538 Deficiency of other specified B group vitamins: Secondary | ICD-10-CM

## 2023-02-06 MED ORDER — CYANOCOBALAMIN 1000 MCG/ML IJ SOLN
1000.0000 ug | Freq: Once | INTRAMUSCULAR | Status: AC
  Administered 2023-02-06: 1000 ug via INTRAMUSCULAR

## 2023-02-06 NOTE — Progress Notes (Signed)
Pt here for monthly B12 injection per Mackie Pai, PA-C.  Pt states that he still does not have any energy.  He will discuss at next visit.  B12 1072mg given IM, and pt tolerated injection well.  Next B12 injection scheduled for 03/05/23, pt has appointment with PCP.

## 2023-02-18 ENCOUNTER — Other Ambulatory Visit: Payer: Self-pay | Admitting: Medical

## 2023-02-26 ENCOUNTER — Telehealth: Payer: Self-pay | Admitting: Pulmonary Disease

## 2023-02-26 NOTE — Telephone Encounter (Signed)
Pt calling due to his prescription for trelegy has expired, assisted pt with making 6 mon f/u for this Thursday March 7th at 8:45am. Pt is wanting to get a sample of trelegy in the meantime. Pls Advise.

## 2023-02-27 NOTE — Telephone Encounter (Signed)
Pt returning call

## 2023-02-27 NOTE — Telephone Encounter (Signed)
ATC patient. LVMTCB. 

## 2023-02-28 MED ORDER — TRELEGY ELLIPTA 100-62.5-25 MCG/ACT IN AEPB: 1.0000 | INHALATION_SPRAY | Freq: Every day | RESPIRATORY_TRACT | 4 refills | Status: DC

## 2023-02-28 NOTE — Telephone Encounter (Signed)
Called and spoke with pt. Rx for Trelegy has been sent to preferred pharmacy. Nothing further needed.

## 2023-03-01 ENCOUNTER — Ambulatory Visit (INDEPENDENT_AMBULATORY_CARE_PROVIDER_SITE_OTHER): Payer: PPO | Admitting: Pulmonary Disease

## 2023-03-01 ENCOUNTER — Encounter: Payer: Self-pay | Admitting: Pulmonary Disease

## 2023-03-01 VITALS — BP 122/74 | HR 80 | Ht 69.0 in | Wt 199.0 lb

## 2023-03-01 DIAGNOSIS — F17210 Nicotine dependence, cigarettes, uncomplicated: Secondary | ICD-10-CM

## 2023-03-01 DIAGNOSIS — J411 Mucopurulent chronic bronchitis: Secondary | ICD-10-CM

## 2023-03-01 DIAGNOSIS — J441 Chronic obstructive pulmonary disease with (acute) exacerbation: Secondary | ICD-10-CM

## 2023-03-01 MED ORDER — TRELEGY ELLIPTA 100-62.5-25 MCG/ACT IN AEPB: 1.0000 | INHALATION_SPRAY | Freq: Every day | RESPIRATORY_TRACT | 0 refills | Status: DC

## 2023-03-01 MED ORDER — TRELEGY ELLIPTA 100-62.5-25 MCG/ACT IN AEPB: 1.0000 | INHALATION_SPRAY | Freq: Every day | RESPIRATORY_TRACT | 11 refills | Status: DC

## 2023-03-01 MED ORDER — PREDNISONE 10 MG PO TABS: ORAL_TABLET | ORAL | 0 refills | Status: AC

## 2023-03-01 MED ORDER — AZITHROMYCIN 250 MG PO TABS: ORAL_TABLET | ORAL | 0 refills | Status: DC

## 2023-03-01 NOTE — Progress Notes (Signed)
Synopsis: Follow up for COPD  Subjective:   PATIENT ID: Trevor Mack GENDER: male DOB: [birth year], MRN: [id number]  HPI  Chief Complaint  Patient presents with   Follow-up    F/U on COPD. States he has been coughing and more SOB lately. Productive cough with clear phlegm. Still using Trelegy.    Trevor Mack is a 67 year old male, daily smoker with history of COPD who returns to pulmonary clinic for follow up of COPD.  He has increased shortness of breath over the past week with increase sputum production. He has ran out of trelegy ellipta 2 days ago. No fevers chills or sweats. He continues to smoke 1-1.5 packs per day.  OV 08/22/22 He has been doing ok since last visit. Reports his breathing has been stable. He is smoking 1.5 packs per day.  OV 02/22/22 His breathing is doing ok. He remains on trelegy ellipta 100, 1 puff daily and is using albuterol 4-6 times per day. He continues to smoke nearly 2 packs per day. He did not tolerate azithromycin therapy as it upset his stomach.  PFTs show moderately severe obstruction with significant bronchodilator response, normal TLC and moderate diffusion defect.   He has completed an overnight oximetry test at [facility] but we do not have the report yet.   Past Medical History:  Diagnosis Date   ANXIETY 07/23/2008   COLONIC POLYPS, HX OF 03/26/2006   MULTIPLE FRAGMENTS OF TUBULAR ADENOMAS POLYPS   COPD (chronic obstructive pulmonary disease) ([place])    Diverticulosis    Family history of colon cancer    Father    GERD 07/23/2008   Hepatitis C    HYPERLIPIDEMIA 07/23/2008   HYPERTENSION 07/23/2008   TENDINITIS, LEFT THUMB 07/15/2010     Family History  Problem Relation Age of Onset   Colon cancer Father 83   Hypertension Father    Diabetes Father    Heart attack Father    Colon polyps Neg Hx    Esophageal cancer Neg Hx    Rectal cancer Neg Hx    Stomach cancer Neg Hx      Social History   Socioeconomic History   Marital status:  Widowed    Spouse name: Not on file   Number of children: 0   Years of education: Not on file   Highest education level: Not on file  Occupational History    Employer: [employer]  Tobacco Use   Smoking status: Every Day    Packs/day: 1.50    Years: 49.00    Total pack years: 73.50    Types: Cigarettes   Smokeless tobacco: Never  Vaping Use   Vaping Use: Never used  Substance and Sexual Activity   Alcohol use: No   Drug use: No   Sexual activity: Not on file  Other Topics Concern   Not on file  Social History Narrative   Not on file   Social Determinants of Health   Financial Resource Strain: Low Risk  (08/24/2021)   Overall Financial Resource Strain (CARDIA)    Difficulty of Paying Living Expenses: Not hard at all  Food Insecurity: No Food Insecurity (08/24/2021)   Hunger Vital Sign    Worried About Running Out of Food in the Last Year: Never true    Ran Out of Food in the Last Year: Never true  Transportation Needs: No Transportation Needs (08/24/2021)   PRAPARE - Hydrologist (Medical): No  Lack of Transportation (Non-Medical): No  Physical Activity: Inactive (08/24/2021)   Exercise Vital Sign    Days of Exercise per Week: 0 days    Minutes of Exercise per Session: 0 min  Stress: No Stress Concern Present (08/24/2021)   [place]    Feeling of Stress : Not at all  Social Connections: Socially Isolated (08/24/2021)   Social Connection and Isolation Panel [NHANES]    Frequency of Communication with Friends and Family: More than three times a week    Frequency of Social Gatherings with Friends and Family: More than three times a week    Attends Religious Services: Never    Marine scientist or Organizations: No    Attends Archivist Meetings: Never    Marital Status: Widowed  Intimate Partner Violence: Not At Risk (08/24/2021)   Humiliation,  Afraid, Rape, and Kick questionnaire    Fear of Current or Ex-Partner: No    Emotionally Abused: No    Physically Abused: No    Sexually Abused: No     No Known Allergies   Outpatient Medications Prior to Visit  Medication Sig Dispense Refill   albuterol (PROVENTIL) (2.5 MG/3ML) 0.083% nebulizer solution Take 3 mLs (2.5 mg total) by nebulization every 6 (six) hours as needed for wheezing or shortness of breath. Dx code: J44.9 75 mL 11   albuterol (VENTOLIN HFA) 108 (90 Base) MCG/ACT inhaler Inhale 1-2 puffs into the lungs every 6 (six) hours as needed for wheezing or shortness of breath. 9 g 5   amLODipine (NORVASC) 5 MG tablet Take 1 tablet (5 mg total) by mouth daily. 90 tablet 1   atorvastatin (LIPITOR) 40 MG tablet TAKE 1 TABLET(40 MG) BY MOUTH DAILY 90 tablet 0   citalopram (CELEXA) 40 MG tablet TAKE 1 TABLET(40 MG) BY MOUTH DAILY 90 tablet 0   ibuprofen (ADVIL) 200 MG tablet Take 400 mg by mouth every 6 (six) hours as needed.     ketoconazole (NIZORAL) 2 % cream Apply 1 application topically daily. 15 g 0   LORazepam (ATIVAN) 0.5 MG tablet TAKE 1 TABLET(0.5 MG) BY MOUTH TWICE DAILY 60 tablet 2   losartan (COZAAR) 50 MG tablet Take 1 tablet (50 mg total) by mouth daily. 90 tablet 1   omeprazole (PRILOSEC) 40 MG capsule Take 1 capsule (40 mg total) by mouth daily. 90 capsule 3   tamsulosin (FLOMAX) 0.4 MG CAPS capsule Take 1 capsule (0.4 mg total) by mouth daily. 90 capsule 3   Fluticasone-Umeclidin-Vilant (TRELEGY ELLIPTA) 100-62.5-25 MCG/ACT AEPB Inhale 1 puff into the lungs daily. 60 each 4   No facility-administered medications prior to visit.    Review of Systems  Constitutional:  Negative for chills, fever and weight loss.  HENT:  Negative for congestion, sinus pain and sore throat.   Eyes: Negative.   Respiratory:  Positive for cough, sputum production, shortness of breath and wheezing. Negative for hemoptysis.   Cardiovascular:  Negative for chest pain and leg swelling.   Gastrointestinal:  Negative for abdominal pain, heartburn and nausea.  Genitourinary: Negative.   Musculoskeletal: Negative.   Neurological:  Negative for dizziness, weakness and headaches.  Endo/Heme/Allergies: Negative.   Psychiatric/Behavioral: Negative.     Objective:   Vitals:   03/01/23 0828  BP: 122/74  Pulse: 80  SpO2: 96%  Weight: 199 lb (90.3 kg)  Height: '5\' 9"'$  (1.753 m)   Physical Exam Constitutional:      General: He  is not in acute distress.    Appearance: He is obese. He is not ill-appearing.  HENT:     Head: Normocephalic and atraumatic.  Cardiovascular:     Pulses: Normal pulses.     Heart sounds: Normal heart sounds. No murmur heard. Pulmonary:     Effort: No accessory muscle usage or prolonged expiration.     Breath sounds: Decreased air movement present. Decreased breath sounds, wheezing and rhonchi present. No rales.  Musculoskeletal:     Right lower leg: No edema.     Left lower leg: No edema.  Skin:    Capillary Refill: Capillary refill takes less than 2 seconds.  Neurological:     General: No focal deficit present.     Mental Status: He is alert.     Gait: Gait normal.  Psychiatric:        Mood and Affect: Mood normal.        Behavior: Behavior normal.        Thought Content: Thought content normal.        Judgment: Judgment normal.    CBC    Component Value Date/Time   WBC 10.3 09/08/2022 0831   RBC 5.49 09/08/2022 0831   HGB 15.5 09/08/2022 0831   HCT 46.3 09/08/2022 0831   PLT 177.0 09/08/2022 0831   MCV 84.4 09/08/2022 0831   MCHC 33.3 09/08/2022 0831   RDW 14.7 09/08/2022 0831   LYMPHSABS 3.0 09/08/2022 0831   MONOABS 0.6 09/08/2022 0831   EOSABS 0.2 09/08/2022 0831   BASOSABS 0.1 09/08/2022 0831      Latest Ref Rng & Units 09/08/2022    8:31 AM 06/08/2022    8:21 AM 02/06/2022    9:02 AM  BMP  Glucose 70 - 99 mg/dL 74  84  79   BUN 6 - 23 mg/dL '19  14  11   '$ Creatinine 0.40 - 1.50 mg/dL 1.10  1.05  1.13   Sodium 135 -  145 mEq/L 142  141  140   Potassium 3.5 - 5.1 mEq/L 4.2  3.9  3.7   Chloride 96 - 112 mEq/L 104  103  102   CO2 19 - 32 mEq/L '29  29  30   '$ Calcium 8.4 - 10.5 mg/dL 9.0  8.7  9.1    Chest imaging: CT Chest 01/19/2021 Mediastinum/Nodes: No mediastinal lymphadenopathy. No evidence for gross hilar lymphadenopathy although assessment is limited by the lack of intravenous contrast on today's study. Tiny hiatal hernia. The esophagus has normal imaging features. There is no axillary lymphadenopathy.   Lungs/Pleura: Centrilobular emphsyema noted. Scattered solid and non solid bilateral pulmonary nodules are identified. No overtly suspicious nodule or mass. No focal airspace consolidation. No pleural effusion.  CXR 10/2018 There is no edema or consolidation. The heart size and pulmonary vascularity are normal. No adenopathy. No evident bone lesions.  PFT:    Latest Ref Rng & Units 08/05/2021    8:30 AM 04/12/2017    2:47 PM  PFT Results  FVC-Pre L 2.88  2.43   FVC-Predicted Pre % 64  52   FVC-Post L 3.12  2.85   FVC-Predicted Post % 69  61   Pre FEV1/FVC % % 55  62   Post FEV1/FCV % % 56  61   FEV1-Pre L 1.57  1.49   FEV1-Predicted Pre % 46  42   FEV1-Post L 1.75  1.75   DLCO uncorrected ml/min/mmHg 11.54  12.29   DLCO UNC% % 44  39   DLCO corrected ml/min/mmHg 11.54  11.82   DLCO COR %Predicted % 44  38   DLVA Predicted % 59  61   TLC L 6.84  7.02   TLC % Predicted % 100  103   RV % Predicted % 155  214     PFT 2022: Moderate severe obstruction, significant bronchodilator response  Assessment & Plan:   Mucopurulent chronic bronchitis (HCC) - Plan: Fluticasone-Umeclidin-Vilant (TRELEGY ELLIPTA) 100-62.5-25 MCG/ACT AEPB  Cigarette smoker  COPD with acute exacerbation (HCC) - Plan: predniSONE (DELTASONE) 10 MG tablet, azithromycin (ZITHROMAX) 250 MG tablet  Discussion: Timouthy Rittgers is a 67 year old male, daily smoker with history of COPD who returns to pulmonary clinic for  follow up.   He has COPD exacerbation at this time given increase in symptoms and wheezing/rhonchi on exam  He is to continue on trelegy ellipta 1 puff daily and albuterol inhaler as needed.   We will start him on steroid taper and Zpak.   He is to continue with lung cancer screening. He is not interested in quitting smoking.  Patient to follow up in 6 months.  Freda Jackson, MD [facility] Office: [phone]     Current Outpatient Medications:    albuterol (PROVENTIL) (2.5 MG/3ML) 0.083% nebulizer solution, Take 3 mLs (2.5 mg total) by nebulization every 6 (six) hours as needed for wheezing or shortness of breath. Dx code: J44.9, Disp: 75 mL, Rfl: 11   albuterol (VENTOLIN HFA) 108 (90 Base) MCG/ACT inhaler, Inhale 1-2 puffs into the lungs every 6 (six) hours as needed for wheezing or shortness of breath., Disp: 9 g, Rfl: 5   amLODipine (NORVASC) 5 MG tablet, Take 1 tablet (5 mg total) by mouth daily., Disp: 90 tablet, Rfl: 1   atorvastatin (LIPITOR) 40 MG tablet, TAKE 1 TABLET(40 MG) BY MOUTH DAILY, Disp: 90 tablet, Rfl: 0   azithromycin (ZITHROMAX) 250 MG tablet, Take as directed, Disp: 6 tablet, Rfl: 0   citalopram (CELEXA) 40 MG tablet, TAKE 1 TABLET(40 MG) BY MOUTH DAILY, Disp: 90 tablet, Rfl: 0   Fluticasone-Umeclidin-Vilant (TRELEGY ELLIPTA) 100-62.5-25 MCG/ACT AEPB, Inhale 1 puff into the lungs daily., Disp: 1 each, Rfl: 0   ibuprofen (ADVIL) 200 MG tablet, Take 400 mg by mouth every 6 (six) hours as needed., Disp: , Rfl:    ketoconazole (NIZORAL) 2 % cream, Apply 1 application topically daily., Disp: 15 g, Rfl: 0   LORazepam (ATIVAN) 0.5 MG tablet, TAKE 1 TABLET(0.5 MG) BY MOUTH TWICE DAILY, Disp: 60 tablet, Rfl: 2   losartan (COZAAR) 50 MG tablet, Take 1 tablet (50 mg total) by mouth daily., Disp: 90 tablet, Rfl: 1   omeprazole (PRILOSEC) 40 MG capsule, Take 1 capsule (40 mg total) by mouth daily., Disp: 90 capsule, Rfl: 3   predniSONE (DELTASONE)  10 MG tablet, Take 4 tablets (40 mg total) by mouth daily with breakfast for 3 days, THEN 3 tablets (30 mg total) daily with breakfast for 3 days, THEN 2 tablets (20 mg total) daily with breakfast for 3 days, THEN 1 tablet (10 mg total) daily with breakfast for 3 days., Disp: 30 tablet, Rfl: 0   tamsulosin (FLOMAX) 0.4 MG CAPS capsule, Take 1 capsule (0.4 mg total) by mouth daily., Disp: 90 capsule, Rfl: 3   Fluticasone-Umeclidin-Vilant (TRELEGY ELLIPTA) 100-62.5-25 MCG/ACT AEPB, Inhale 1 puff into the lungs daily., Disp: 60 each, Rfl: 11

## 2023-03-01 NOTE — Patient Instructions (Addendum)
Continue trelegy ellipta 1 puff daily - rinse mouth out after each use - we will give you a sample today until you can pick up your refill  Start prednisone taper  Start Zpak antibiotic  Our lung cancer screening team will be reaching out to you  Follow up in 6 months

## 2023-03-02 ENCOUNTER — Other Ambulatory Visit: Payer: Self-pay | Admitting: *Deleted

## 2023-03-02 DIAGNOSIS — Z122 Encounter for screening for malignant neoplasm of respiratory organs: Secondary | ICD-10-CM

## 2023-03-02 DIAGNOSIS — F17210 Nicotine dependence, cigarettes, uncomplicated: Secondary | ICD-10-CM

## 2023-03-02 DIAGNOSIS — Z87891 Personal history of nicotine dependence: Secondary | ICD-10-CM

## 2023-03-03 ENCOUNTER — Encounter: Payer: Self-pay | Admitting: Pulmonary Disease

## 2023-03-05 ENCOUNTER — Ambulatory Visit (INDEPENDENT_AMBULATORY_CARE_PROVIDER_SITE_OTHER): Payer: PPO | Admitting: Medical

## 2023-03-05 VITALS — BP 118/66 | HR 77 | Temp 98.0°F | Resp 16 | Ht 69.0 in | Wt 198.8 lb

## 2023-03-05 DIAGNOSIS — Z79899 Other long term (current) drug therapy: Secondary | ICD-10-CM

## 2023-03-05 DIAGNOSIS — J441 Chronic obstructive pulmonary disease with (acute) exacerbation: Secondary | ICD-10-CM | POA: Diagnosis not present

## 2023-03-05 DIAGNOSIS — E876 Hypokalemia: Secondary | ICD-10-CM | POA: Diagnosis not present

## 2023-03-05 DIAGNOSIS — F3289 Other specified depressive episodes: Secondary | ICD-10-CM

## 2023-03-05 DIAGNOSIS — E538 Deficiency of other specified B group vitamins: Secondary | ICD-10-CM

## 2023-03-05 DIAGNOSIS — Z86010 Personal history of colonic polyps: Secondary | ICD-10-CM

## 2023-03-05 DIAGNOSIS — Z1211 Encounter for screening for malignant neoplasm of colon: Secondary | ICD-10-CM | POA: Diagnosis not present

## 2023-03-05 DIAGNOSIS — F17200 Nicotine dependence, unspecified, uncomplicated: Secondary | ICD-10-CM | POA: Diagnosis not present

## 2023-03-05 DIAGNOSIS — F419 Anxiety disorder, unspecified: Secondary | ICD-10-CM

## 2023-03-05 DIAGNOSIS — I10 Essential (primary) hypertension: Secondary | ICD-10-CM

## 2023-03-05 DIAGNOSIS — E785 Hyperlipidemia, unspecified: Secondary | ICD-10-CM | POA: Diagnosis not present

## 2023-03-05 DIAGNOSIS — Z800 Family history of malignant neoplasm of digestive organs: Secondary | ICD-10-CM

## 2023-03-05 LAB — COMPREHENSIVE METABOLIC PANEL
ALT: 14 U/L (ref 0–53)
AST: 11 U/L (ref 0–37)
Albumin: 3.9 g/dL (ref 3.5–5.2)
Alkaline Phosphatase: 119 U/L — ABNORMAL HIGH (ref 39–117)
BUN: 14 mg/dL (ref 6–23)
CO2: 29 mEq/L (ref 19–32)
Calcium: 8.9 mg/dL (ref 8.4–10.5)
Chloride: 101 mEq/L (ref 96–112)
Creatinine, Ser: 1.13 mg/dL (ref 0.40–1.50)
GFR: 68.04 mL/min (ref >60.00)
Glucose, Bld: 96 mg/dL (ref 70–99)
Potassium: 3.1 mEq/L — ABNORMAL LOW (ref 3.5–5.1)
Sodium: 140 mEq/L (ref 135–145)
Total Bilirubin: 0.6 mg/dL (ref 0.2–1.2)
Total Protein: 6.9 g/dL (ref 6.0–8.3)

## 2023-03-05 LAB — VITAMIN B12: Vitamin B-12: >1500 pg/mL — ABNORMAL HIGH (ref 211–911)

## 2023-03-05 MED ORDER — CYANOCOBALAMIN 1000 MCG/ML IJ SOLN
1000.0000 ug | Freq: Once | INTRAMUSCULAR | Status: AC
  Administered 2023-03-05: 1000 ug via INTRAMUSCULAR

## 2023-03-05 MED ORDER — POTASSIUM CHLORIDE CRYS ER 10 MEQ PO TBCR: 10.0000 meq | EXTENDED_RELEASE_TABLET | Freq: Every day | ORAL | 0 refills | Status: AC

## 2023-03-05 NOTE — Patient Instructions (Addendum)
Fatigue and b12 deficiency. Get b12 injection today and lab.   For anxiety continue ativan. Up to date on contract and uds.  Stable /controlled. depression. Continue celexa  Htn- pt bp well controlled today. He is on losartan 50 mg daily and amlodipine 5 mg daily.    High cholesterol on atorvastatin.    Copd- continue trelegy and get ct chest next week. Try to quit smoking/  Referral for colononoscopy placed.  Recommend shingrix thru the pharmacy.  Follow up in 3 months or sooner if needed.

## 2023-03-05 NOTE — Addendum Note (Signed)
Addended by: Laure Kidney on: 03/05/2023 09:00 AM   Modules accepted: Orders

## 2023-03-05 NOTE — Addendum Note (Signed)
Addended by: Anabel Halon on: 03/05/2023 12:42 PM   Modules accepted: Orders

## 2023-03-05 NOTE — Progress Notes (Signed)
Subjective:    Patient ID: Trevor Mack, male    DOB: July 04, 1956, 67 y.o.   MRN: [id number]  HPI  Pt is still fatigued  fatigue despite getting b12 injection for b12 deficiency. He only got 4 weeks of b12 injections. Did not get monthly injections.   Pt anxiety is controlled with ativan. uds and contract up to date but about to need renewal by end of this month..  Contract and uds up to date.   Some mild depression. He is celexa and states mood is not bad.    Next appt will be in January. Pt has copd. He is still on trelegy and sees pulmonologist. "Chronic obstructive lung disease with acute on chronic bronchitis and exacerbation gold stage C    Saw pulmonologist in about one week ago. Recently given zpak and prednisone taper. He states getting ct chest on 03-14-2023.    Htn- pt bp well controlled today. He is on losartan 50 mg daily and amlodipine 5 mg daily.    High cholesterol on atorvastatin.     Pt has seen urologist since last visit. Pt states told has bph and given tab for that.  Pt may be due tof colonsocpy per epic.   He has not had shigrix vaccine.   Review of Systems  Constitutional:  Positive for fatigue. Negative for chills.  HENT:  Negative for congestion.   Respiratory:  Negative for cough, chest tightness, wheezing and stridor.   Cardiovascular:  Negative for chest pain and palpitations.  Gastrointestinal:  Negative for abdominal pain.  Genitourinary:  Negative for dysuria.  Musculoskeletal:  Negative for back pain.  Skin:  Negative for rash.  Neurological:  Negative for dizziness, seizures, facial asymmetry and light-headedness.  Hematological:  Negative for adenopathy. Does not bruise/bleed easily.  Psychiatric/Behavioral:  Negative for behavioral problems and confusion. The patient is not nervous/anxious.     Past Medical History:  Diagnosis Date   ANXIETY 07/23/2008   COLONIC POLYPS, HX OF 03/26/2006   MULTIPLE FRAGMENTS OF TUBULAR ADENOMAS POLYPS    COPD (chronic obstructive pulmonary disease) ([place])    Diverticulosis    Family history of colon cancer    Father    GERD 07/23/2008   Hepatitis C    HYPERLIPIDEMIA 07/23/2008   HYPERTENSION 07/23/2008   TENDINITIS, LEFT THUMB 07/15/2010     Social History   Socioeconomic History   Marital status: Widowed    Spouse name: Not on file   Number of children: 0   Years of education: Not on file   Highest education level: Not on file  Occupational History    Employer: [employer]  Tobacco Use   Smoking status: Every Day    Packs/day: 1.50    Years: 49.00    Total pack years: 73.50    Types: Cigarettes   Smokeless tobacco: Never  Vaping Use   Vaping Use: Never used  Substance and Sexual Activity   Alcohol use: No   Drug use: No   Sexual activity: Not on file  Other Topics Concern   Not on file  Social History Narrative   Not on file   Social Determinants of Health   Financial Resource Strain: Low Risk  (08/24/2021)   Overall Financial Resource Strain (CARDIA)    Difficulty of Paying Living Expenses: Not hard at all  Food Insecurity: No Food Insecurity (08/24/2021)   Hunger Vital Sign    Worried About Running Out of Food in the Last Year: Never  true    Ran Out of Food in the Last Year: Never true  Transportation Needs: No Transportation Needs (08/24/2021)   PRAPARE - Hydrologist (Medical): No    Lack of Transportation (Non-Medical): No  Physical Activity: Inactive (08/24/2021)   Exercise Vital Sign    Days of Exercise per Week: 0 days    Minutes of Exercise per Session: 0 min  Stress: No Stress Concern Present (08/24/2021)   [place]    Feeling of Stress : Not at all  Social Connections: Socially Isolated (08/24/2021)   Social Connection and Isolation Panel [NHANES]    Frequency of Communication with Friends and Family: More than three times a week    Frequency of  Social Gatherings with Friends and Family: More than three times a week    Attends Religious Services: Never    Marine scientist or Organizations: No    Attends Archivist Meetings: Never    Marital Status: Widowed  Intimate Partner Violence: Not At Risk (08/24/2021)   Humiliation, Afraid, Rape, and Kick questionnaire    Fear of Current or Ex-Partner: No    Emotionally Abused: No    Physically Abused: No    Sexually Abused: No    Past Surgical History:  Procedure Laterality Date   COLONOSCOPY  11/27/2012   LUMBAR LAMINECTOMY      Family History  Problem Relation Age of Onset   Colon cancer Father 62   Hypertension Father    Diabetes Father    Heart attack Father    Colon polyps Neg Hx    Esophageal cancer Neg Hx    Rectal cancer Neg Hx    Stomach cancer Neg Hx     No Known Allergies  Current Outpatient Medications on File Prior to Visit  Medication Sig Dispense Refill   albuterol (PROVENTIL) (2.5 MG/3ML) 0.083% nebulizer solution Take 3 mLs (2.5 mg total) by nebulization every 6 (six) hours as needed for wheezing or shortness of breath. Dx code: J44.9 75 mL 11   albuterol (VENTOLIN HFA) 108 (90 Base) MCG/ACT inhaler Inhale 1-2 puffs into the lungs every 6 (six) hours as needed for wheezing or shortness of breath. 9 g 5   amLODipine (NORVASC) 5 MG tablet Take 1 tablet (5 mg total) by mouth daily. 90 tablet 1   atorvastatin (LIPITOR) 40 MG tablet TAKE 1 TABLET(40 MG) BY MOUTH DAILY 90 tablet 0   azithromycin (ZITHROMAX) 250 MG tablet Take as directed 6 tablet 0   citalopram (CELEXA) 40 MG tablet TAKE 1 TABLET(40 MG) BY MOUTH DAILY 90 tablet 0   Fluticasone-Umeclidin-Vilant (TRELEGY ELLIPTA) 100-62.5-25 MCG/ACT AEPB Inhale 1 puff into the lungs daily. 60 each 11   Fluticasone-Umeclidin-Vilant (TRELEGY ELLIPTA) 100-62.5-25 MCG/ACT AEPB Inhale 1 puff into the lungs daily. 1 each 0   ibuprofen (ADVIL) 200 MG tablet Take 400 mg by mouth every 6 (six) hours as  needed.     ketoconazole (NIZORAL) 2 % cream Apply 1 application topically daily. 15 g 0   LORazepam (ATIVAN) 0.5 MG tablet TAKE 1 TABLET(0.5 MG) BY MOUTH TWICE DAILY 60 tablet 2   losartan (COZAAR) 50 MG tablet Take 1 tablet (50 mg total) by mouth daily. 90 tablet 1   omeprazole (PRILOSEC) 40 MG capsule Take 1 capsule (40 mg total) by mouth daily. 90 capsule 3   predniSONE (DELTASONE) 10 MG tablet Take 4 tablets (40 mg total) by mouth  daily with breakfast for 3 days, THEN 3 tablets (30 mg total) daily with breakfast for 3 days, THEN 2 tablets (20 mg total) daily with breakfast for 3 days, THEN 1 tablet (10 mg total) daily with breakfast for 3 days. 30 tablet 0   tamsulosin (FLOMAX) 0.4 MG CAPS capsule Take 1 capsule (0.4 mg total) by mouth daily. 90 capsule 3   No current facility-administered medications on file prior to visit.    BP 118/66 (BP Location: Left Arm, Patient Position: Sitting, Cuff Size: Normal)   Pulse 77   Temp 98 F (36.7 C)   Resp 16   Ht '5\' 9"'$  (1.753 m)   Wt 198 lb 12.8 oz (90.2 kg)   SpO2 95%   BMI 29.36 kg/m        Objective:   Physical Exam  General Mental Status- Alert. General Appearance- Not in acute distress.    Skin General: Color- Normal Color. Moisture- Normal Moisture.   Neck Carotid Arteries- Normal color. Moisture- Normal Moisture. No carotid bruits. No JVD.   Chest and Lung Exam Equal and symmetric but some shallow and faint wheeze.(Baseline condition/see copd)     Cardiovascular Auscultation:Rythm- Regular. Murmurs & Other Heart Sounds:Auscultation of the heart reveals- No Murmurs.   Abdomen Inspection:-Inspeection Normal. Palpation/Percussion:Note:No mass. Palpation and Percussion of the abdomen reveal- Non Tender, Non Distended + BS, no rebound or guarding.     Neurologic Cranial Nerve exam:- CN III-XII intact(No nystagmus), symmetric smile. Strength:- 5/5 equal and symmetric strength both upper and lower extremities.        Assessment & Plan:   Patient Instructions  Fatigue and b12 deficiency. Get b12 injection today and lab.   For anxiety continue ativan. Up to date on contract and uds.  Stable /controlled. depression. Continue celexa  Htn- pt bp well controlled today. He is on losartan 50 mg daily and amlodipine 5 mg daily.    High cholesterol on atorvastatin.    Copd- continue trelegy and get ct chest next week. Try to quit smoking/  Referral for colononoscopy placed.  Recommend shingrix thru the pharmacy.  Follow up in 3 months or sooner if needed.    Mackie Pai, PA-C

## 2023-03-06 ENCOUNTER — Telehealth: Payer: Self-pay | Admitting: Pulmonary Disease

## 2023-03-06 NOTE — Telephone Encounter (Signed)
Pt. Calling back nurse he said that he was returning a call

## 2023-03-08 NOTE — Telephone Encounter (Signed)
No encounter documented.

## 2023-03-13 DIAGNOSIS — H2512 Age-related nuclear cataract, left eye: Secondary | ICD-10-CM | POA: Diagnosis not present

## 2023-03-13 DIAGNOSIS — H5213 Myopia, bilateral: Secondary | ICD-10-CM | POA: Diagnosis not present

## 2023-03-13 DIAGNOSIS — H52223 Regular astigmatism, bilateral: Secondary | ICD-10-CM | POA: Diagnosis not present

## 2023-03-13 DIAGNOSIS — R251 Tremor, unspecified: Secondary | ICD-10-CM | POA: Diagnosis not present

## 2023-03-13 DIAGNOSIS — H25813 Combined forms of age-related cataract, bilateral: Secondary | ICD-10-CM | POA: Diagnosis not present

## 2023-03-13 DIAGNOSIS — J449 Chronic obstructive pulmonary disease, unspecified: Secondary | ICD-10-CM | POA: Diagnosis not present

## 2023-03-13 DIAGNOSIS — I8393 Asymptomatic varicose veins of bilateral lower extremities: Secondary | ICD-10-CM | POA: Diagnosis not present

## 2023-03-13 DIAGNOSIS — H2513 Age-related nuclear cataract, bilateral: Secondary | ICD-10-CM | POA: Diagnosis not present

## 2023-03-14 ENCOUNTER — Ambulatory Visit (HOSPITAL_BASED_OUTPATIENT_CLINIC_OR_DEPARTMENT_OTHER)
Admission: RE | Admit: 2023-03-14 | Discharge: 2023-03-14 | Disposition: A | Discharge status: Home / Self Care | Payer: PPO | Source: Ambulatory Visit | Attending: Acute Care | Admitting: Acute Care

## 2023-03-14 DIAGNOSIS — F17210 Nicotine dependence, cigarettes, uncomplicated: Secondary | ICD-10-CM | POA: Diagnosis not present

## 2023-03-14 DIAGNOSIS — Z87891 Personal history of nicotine dependence: Secondary | ICD-10-CM

## 2023-03-14 DIAGNOSIS — Z122 Encounter for screening for malignant neoplasm of respiratory organs: Secondary | ICD-10-CM | POA: Diagnosis not present

## 2023-03-16 ENCOUNTER — Other Ambulatory Visit: Payer: Self-pay | Admitting: Medical

## 2023-03-16 NOTE — Telephone Encounter (Signed)
Requesting: lorazepam 0.5mg   Contract: 09/08/22 UDS: 09/08/22 Last Visit: 03/05/23 Next Visit: 06/05/23 Last Refill: 12/12/22 #60 and 2RF   Please Advise

## 2023-03-18 NOTE — Telephone Encounter (Signed)
Rx refill sent to pharmacy. 

## 2023-03-19 ENCOUNTER — Other Ambulatory Visit: Payer: PPO

## 2023-03-19 ENCOUNTER — Other Ambulatory Visit: Payer: Self-pay

## 2023-03-19 DIAGNOSIS — Z87891 Personal history of nicotine dependence: Secondary | ICD-10-CM

## 2023-03-19 DIAGNOSIS — F17210 Nicotine dependence, cigarettes, uncomplicated: Secondary | ICD-10-CM

## 2023-03-22 ENCOUNTER — Other Ambulatory Visit: Payer: PPO

## 2023-04-05 ENCOUNTER — Ambulatory Visit: Payer: PPO

## 2023-04-15 ENCOUNTER — Other Ambulatory Visit: Payer: Self-pay | Admitting: Medical

## 2023-04-17 ENCOUNTER — Other Ambulatory Visit: Payer: Self-pay | Admitting: Medical

## 2023-05-27 ENCOUNTER — Other Ambulatory Visit: Payer: Self-pay | Admitting: Medical

## 2023-06-05 ENCOUNTER — Ambulatory Visit: Payer: PPO | Admitting: Medical

## 2023-06-15 ENCOUNTER — Other Ambulatory Visit: Payer: Self-pay | Admitting: Medical

## 2023-06-15 NOTE — Telephone Encounter (Signed)
Requesting: lorazepam 0.5mg   Contract: 09/08/22 UDS: 09/08/22 Last Visit: 03/05/23 Next Visit: None Last Refill: 03/18/23 #60 and 2RF  Please Advise

## 2023-06-16 NOTE — Telephone Encounter (Signed)
Rx refill sent. Follow up sept controlled med visit.

## 2023-06-26 ENCOUNTER — Telehealth: Payer: Self-pay | Admitting: Pulmonary Disease

## 2023-06-26 NOTE — Telephone Encounter (Signed)
Trevor Mack states patient needs Nebulizer solution called to pharmacy. Eye surgery scheduled 07/05/2023. Pharmacy is [facility]. Trevor Mack phone number is [phone].

## 2023-06-29 MED ORDER — ALBUTEROL SULFATE (2.5 MG/3ML) 0.083% IN NEBU: 2.5000 mg | INHALATION_SOLUTION | Freq: Four times a day (QID) | RESPIRATORY_TRACT | 11 refills | Status: DC | PRN

## 2023-06-29 NOTE — Telephone Encounter (Signed)
Rx has been sent to patients pharmacy. Nothing further needed.  

## 2023-07-02 ENCOUNTER — Ambulatory Visit (HOSPITAL_BASED_OUTPATIENT_CLINIC_OR_DEPARTMENT_OTHER)
Admission: RE | Admit: 2023-07-02 | Discharge: 2023-07-02 | Disposition: A | Discharge status: Home / Self Care | Payer: PPO | Source: Ambulatory Visit | Attending: Medical | Admitting: Medical

## 2023-07-02 ENCOUNTER — Ambulatory Visit (INDEPENDENT_AMBULATORY_CARE_PROVIDER_SITE_OTHER): Payer: PPO | Admitting: Medical

## 2023-07-02 ENCOUNTER — Encounter: Payer: Self-pay | Admitting: Medical

## 2023-07-02 VITALS — BP 126/67 | HR 90 | Temp 98.0°F | Resp 16 | Ht 69.0 in | Wt 194.0 lb

## 2023-07-02 DIAGNOSIS — R059 Cough, unspecified: Secondary | ICD-10-CM | POA: Diagnosis not present

## 2023-07-02 DIAGNOSIS — R058 Other specified cough: Secondary | ICD-10-CM | POA: Diagnosis not present

## 2023-07-02 DIAGNOSIS — J440 Chronic obstructive pulmonary disease with acute lower respiratory infection: Secondary | ICD-10-CM

## 2023-07-02 DIAGNOSIS — J209 Acute bronchitis, unspecified: Secondary | ICD-10-CM | POA: Insufficient documentation

## 2023-07-02 DIAGNOSIS — J441 Chronic obstructive pulmonary disease with (acute) exacerbation: Secondary | ICD-10-CM

## 2023-07-02 DIAGNOSIS — E538 Deficiency of other specified B group vitamins: Secondary | ICD-10-CM

## 2023-07-02 MED ORDER — BENZONATATE 100 MG PO CAPS: 100.0000 mg | ORAL_CAPSULE | Freq: Three times a day (TID) | ORAL | 0 refills | Status: DC | PRN

## 2023-07-02 MED ORDER — ALBUTEROL SULFATE (2.5 MG/3ML) 0.083% IN NEBU: 2.5000 mg | INHALATION_SOLUTION | Freq: Four times a day (QID) | RESPIRATORY_TRACT | 5 refills | Status: DC | PRN

## 2023-07-02 MED ORDER — AZITHROMYCIN 250 MG PO TABS: ORAL_TABLET | ORAL | 0 refills | Status: AC

## 2023-07-02 NOTE — Patient Instructions (Addendum)
COPD with acute exacerbation(bronchitis) with productive Cough at times Continue treelegy, refill abuterol neb solution, rx azithromycin antibiotic and benzonatate for cough.  -update me by Wednesday if feeling better. May need to rx taper medrol - CBC w/Diff - chest xray -call pulmonologist office for next follow up.    B12 deficiency Get lab and determine if need oral supplametation. - B12   For anxiety continue ativan. Up to date on contract and uds.   Stable /controlled. depression. Continue celexa   Htn- pt bp well controlled today. He is on losartan 50 mg daily and amlodipine 5 mg daily.    High cholesterol on atorvastatin.  Follow up 7 days or sooner if needed

## 2023-07-02 NOTE — Progress Notes (Signed)
Subjective:    Patient ID: Trevor Mack, male    DOB: August 25, 1956, 67 y.o.   MRN: [id number]  HPI Here for follow up. Last visit A/P   Fatigue and b12 deficiency. Got b12 injections and last check was elevated.     For anxiety continue ativan. Up to date on contract and uds.   Stable /controlled. depression. Continue celexa   Htn- pt bp well controlled today. He is on losartan 50 mg daily and amlodipine 5 mg daily.    High cholesterol on atorvastatin.  Copd- pt is on treelegy. Pt ran out of neb solution 2 months ago. Was using it twice a week. He is following by pulmonologist. He states needs to call for follow up appt. Pt states recently coughing up more mucus and more short of breath with activity.   Review of Systems  Constitutional:  Positive for fatigue. Negative for chills.  HENT:  Negative for congestion.   Respiratory:  Positive for cough and shortness of breath. Negative for chest tightness, wheezing and stridor.   Cardiovascular:  Negative for chest pain and palpitations.  Gastrointestinal:  Negative for abdominal pain.  Genitourinary:  Negative for dysuria.  Musculoskeletal:  Negative for back pain.  Skin:  Negative for rash.  Neurological:  Negative for dizziness, seizures, facial asymmetry and light-headedness.  Hematological:  Negative for adenopathy. Does not bruise/bleed easily.  Psychiatric/Behavioral:  Negative for behavioral problems and confusion. The patient is not nervous/anxious.     Past Medical History:  Diagnosis Date   ANXIETY 07/23/2008   COLONIC POLYPS, HX OF 03/26/2006   MULTIPLE FRAGMENTS OF TUBULAR ADENOMAS POLYPS   COPD (chronic obstructive pulmonary disease) (HCC)    Diverticulosis    Family history of colon cancer    Father    GERD 07/23/2008   Hepatitis C    HYPERLIPIDEMIA 07/23/2008   HYPERTENSION 07/23/2008   TENDINITIS, LEFT THUMB 07/15/2010     Social History   Socioeconomic History   Marital status: Widowed    Spouse name:  Not on file   Number of children: 0   Years of education: Not on file   Highest education level: Not on file  Occupational History    Employer: [employer]  Tobacco Use   Smoking status: Every Day    Packs/day: 1.50    Years: 49.00    Additional pack years: 0.00    Total pack years: 73.50    Types: Cigarettes   Smokeless tobacco: Never  Vaping Use   Vaping Use: Never used  Substance and Sexual Activity   Alcohol use: No   Drug use: No   Sexual activity: Not on file  Other Topics Concern   Not on file  Social History Narrative   Not on file   Social Determinants of Health   Financial Resource Strain: Low Risk  (08/24/2021)   Overall Financial Resource Strain (CARDIA)    Difficulty of Paying Living Expenses: Not hard at all  Food Insecurity: No Food Insecurity (08/24/2021)   Hunger Vital Sign    Worried About Running Out of Food in the Last Year: Never true    Ran Out of Food in the Last Year: Never true  Transportation Needs: No Transportation Needs (08/24/2021)   PRAPARE - Administrator, Civil Service (Medical): No    Lack of Transportation (Non-Medical): No  Physical Activity: Inactive (08/24/2021)   Exercise Vital Sign    Days of Exercise per Week: 0 days  Minutes of Exercise per Session: 0 min  Stress: No Stress Concern Present (08/24/2021)   [employer] of Occupational Health - Occupational Stress Questionnaire    Feeling of Stress : Not at all  Social Connections: Socially Isolated (08/24/2021)   Social Connection and Isolation Panel [NHANES]    Frequency of Communication with Friends and Family: More than three times a week    Frequency of Social Gatherings with Friends and Family: More than three times a week    Attends Religious Services: Never    Database administrator or Organizations: No    Attends Banker Meetings: Never    Marital Status: Widowed  Intimate Partner Violence: Not At Risk (08/24/2021)   Humiliation,  Afraid, Rape, and Kick questionnaire    Fear of Current or Ex-Partner: No    Emotionally Abused: No    Physically Abused: No    Sexually Abused: No    Past Surgical History:  Procedure Laterality Date   COLONOSCOPY  11/27/2012   LUMBAR LAMINECTOMY      Family History  Problem Relation Age of Onset   Colon cancer Father 53   Hypertension Father    Diabetes Father    Heart attack Father    Colon polyps Neg Hx    Esophageal cancer Neg Hx    Rectal cancer Neg Hx    Stomach cancer Neg Hx     No Known Allergies  Current Outpatient Medications on File Prior to Visit  Medication Sig Dispense Refill   albuterol (VENTOLIN HFA) 108 (90 Base) MCG/ACT inhaler Inhale 1-2 puffs into the lungs every 6 (six) hours as needed for wheezing or shortness of breath. 9 g 5   amLODipine (NORVASC) 5 MG tablet TAKE 1 TABLET(5 MG) BY MOUTH DAILY 90 tablet 1   atorvastatin (LIPITOR) 40 MG tablet TAKE 1 TABLET(40 MG) BY MOUTH DAILY 90 tablet 0   azithromycin (ZITHROMAX) 250 MG tablet Take as directed 6 tablet 0   citalopram (CELEXA) 40 MG tablet TAKE 1 TABLET(40 MG) BY MOUTH DAILY 90 tablet 0   Fluticasone-Umeclidin-Vilant (TRELEGY ELLIPTA) 100-62.5-25 MCG/ACT AEPB Inhale 1 puff into the lungs daily. 60 each 11   Fluticasone-Umeclidin-Vilant (TRELEGY ELLIPTA) 100-62.5-25 MCG/ACT AEPB Inhale 1 puff into the lungs daily. 1 each 0   ibuprofen (ADVIL) 200 MG tablet Take 400 mg by mouth every 6 (six) hours as needed.     ketoconazole (NIZORAL) 2 % cream Apply 1 application topically daily. 15 g 0   LORazepam (ATIVAN) 0.5 MG tablet TAKE 1 TABLET(0.5 MG) BY MOUTH TWICE DAILY 60 tablet 1   losartan (COZAAR) 50 MG tablet TAKE 1 TABLET(50 MG) BY MOUTH DAILY 90 tablet 1   omeprazole (PRILOSEC) 40 MG capsule Take 1 capsule (40 mg total) by mouth daily. 90 capsule 3   potassium chloride (KLOR-CON M) 10 MEQ tablet Take 1 tablet (10 mEq total) by mouth daily. 7 tablet 0   tamsulosin (FLOMAX) 0.4 MG CAPS capsule Take 1  capsule (0.4 mg total) by mouth daily. 90 capsule 3   No current facility-administered medications on file prior to visit.    BP 126/67 (BP Location: Left Arm, Patient Position: Sitting, Cuff Size: Normal)   Pulse 90   Temp 98 F (36.7 C) (Oral)   Resp 16   Ht 5\' 9"  (1.753 m)   Wt 194 lb (88 kg)   SpO2 95%   BMI 28.65 kg/m        Objective:   Physical Exam  General Mental Status- Alert. General Appearance- Not in acute distress.   Skin General: Color- Normal Color. Moisture- Normal Moisture.  Neck Carotid Arteries- Normal color. Moisture- Normal Moisture. No carotid bruits. No JVD.  Chest and Lung Exam Auscultation: Breath Sounds:-eve, unlabored but shallow bilaterally.  Cardiovascular Auscultation:Rythm- Regular. Murmurs & Other Heart Sounds:Auscultation of the heart reveals- No Murmurs.  Abdomen Inspection:-Inspeection Normal. Palpation/Percussion:Note:No mass. Palpation and Percussion of the abdomen reveal- Non Tender, Non Distended + BS, no rebound or guarding.    Neurologic Cranial Nerve exam:- CN III-XII intact(No nystagmus), symmetric smile. Drift Test:- No drift. Romberg Exam:- Negative.  Heal to Toe Gait exam:-Normal. Finger to Nose:- Normal/Intact Strength:- 5/5 equal and symmetric strength both upper and lower extremities.   Lower ext- no pedal edema. Negative homans signs.    Assessment & Plan:   Patient Instructions   COPD with acute exacerbation(bronchitis) with productive Cough at times Continue treelegy, refill abuterol neb solution, rx azithromycin antibiotic and benzonatate for cough.  -update me by Wednesday if feeling better. May need to rx taper medrol - CBC w/Diff - chest xray -call pulmonologist office for next follow up.    B12 deficiency Get lab and determine if need oral supplametation. - B12   For anxiety continue ativan. Up to date on contract and uds.   Stable /controlled. depression. Continue celexa   Htn- pt bp  well controlled today. He is on losartan 50 mg daily and amlodipine 5 mg daily.    High cholesterol on atorvastatin.  Follow up 7 days or sooner if needed

## 2023-07-03 LAB — CBC WITH DIFFERENTIAL/PLATELET
Basophils Absolute: 0.1 10*3/uL (ref 0.0–0.1)
Basophils Relative: 1.1 % (ref 0.0–3.0)
Eosinophils Absolute: 0.2 10*3/uL (ref 0.0–0.7)
Eosinophils Relative: 1.3 % (ref 0.0–5.0)
HCT: 47.3 % (ref 39.0–52.0)
Hemoglobin: 15.5 g/dL (ref 13.0–17.0)
Lymphocytes Relative: 18.5 % (ref 12.0–46.0)
Lymphs Abs: 2.4 10*3/uL (ref 0.7–4.0)
MCHC: 32.9 g/dL (ref 30.0–36.0)
MCV: 85.6 fl (ref 78.0–100.0)
Monocytes Absolute: 0.9 10*3/uL (ref 0.1–1.0)
Monocytes Relative: 6.6 % (ref 3.0–12.0)
Neutro Abs: 9.4 10*3/uL — ABNORMAL HIGH (ref 1.4–7.7)
Neutrophils Relative %: 72.5 % (ref 43.0–77.0)
Platelets: 226 10*3/uL (ref 150.0–400.0)
RBC: 5.52 Mil/uL (ref 4.22–5.81)
RDW: 14.8 % (ref 11.5–15.5)
WBC: 13 10*3/uL — ABNORMAL HIGH (ref 4.0–10.5)

## 2023-07-03 LAB — VITAMIN B12: Vitamin B-12: 275 pg/mL (ref 211–911)

## 2023-07-09 ENCOUNTER — Telehealth: Payer: Self-pay | Admitting: *Deleted

## 2023-07-09 ENCOUNTER — Ambulatory Visit: Payer: PPO | Admitting: Medical

## 2023-07-09 VITALS — BP 124/66 | HR 100 | Temp 98.2°F | Resp 18 | Ht 69.0 in | Wt 195.6 lb

## 2023-07-09 DIAGNOSIS — J432 Centrilobular emphysema: Secondary | ICD-10-CM | POA: Diagnosis not present

## 2023-07-09 NOTE — Telephone Encounter (Signed)
Pt has a neb machine which he states is not working correctly. He states he needs mor than just tubing. Not sure what he needs. Machine is 66 years old. He lives in [place]. Has copd. Can we refer to [facility]. Or do they go out to pt houses etc? Stop by my station if you can.

## 2023-07-09 NOTE — Progress Notes (Signed)
Subjective:    Patient ID: Trevor Mack, male    DOB: November 20, 1956, 67 y.o.   MRN: [id number]  HPI  Pt in for follow up.   Pt states feeling and breathing better with less wheezing. He states responded to medrol taper dose.   Pt last visit A/P  COPD with acute exacerbation(bronchitis) with productive Cough at times Continue treelegy, refill abuterol neb solution, rx azithromycin antibiotic and benzonatate for cough.  -update me by Wednesday if feeling better. May need to rx taper medrol - CBC w/Diff - chest xray -call pulmonologist office for next follow up.  Pt mentions he is missing some tubing and other supplies. His machine is 2 years ol.   Review of Systems  Constitutional:  Negative for chills and fatigue.  HENT:  Negative for congestion.   Respiratory:  Negative for cough, chest tightness, shortness of breath, wheezing and stridor.        Less short of breath. Less cough and wheezing.  Cardiovascular:  Negative for chest pain and palpitations.  Gastrointestinal:  Negative for abdominal pain.  Genitourinary:  Negative for dysuria.  Musculoskeletal:  Negative for back pain.  Skin:  Negative for rash.  Neurological:  Negative for dizziness, seizures, facial asymmetry and light-headedness.  Hematological:  Negative for adenopathy. Does not bruise/bleed easily.  Psychiatric/Behavioral:  Negative for behavioral problems and confusion. The patient is not nervous/anxious.     Past Medical History:  Diagnosis Date   ANXIETY 07/23/2008   COLONIC POLYPS, HX OF 03/26/2006   MULTIPLE FRAGMENTS OF TUBULAR ADENOMAS POLYPS   COPD (chronic obstructive pulmonary disease) (HCC)    Diverticulosis    Family history of colon cancer    Father    GERD 07/23/2008   Hepatitis C    HYPERLIPIDEMIA 07/23/2008   HYPERTENSION 07/23/2008   TENDINITIS, LEFT THUMB 07/15/2010     Social History   Socioeconomic History   Marital status: Widowed    Spouse name: Not on file   Number of  children: 0   Years of education: Not on file   Highest education level: Not on file  Occupational History    Employer: [employer]  Tobacco Use   Smoking status: Every Day    Current packs/day: 1.50    Average packs/day: 1.5 packs/day for 49.0 years (73.5 ttl pk-yrs)    Types: Cigarettes   Smokeless tobacco: Never  Vaping Use   Vaping status: Never Used  Substance and Sexual Activity   Alcohol use: No   Drug use: No   Sexual activity: Not on file  Other Topics Concern   Not on file  Social History Narrative   Not on file   Social Determinants of Health   Financial Resource Strain: Low Risk  (08/24/2021)   Overall Financial Resource Strain (CARDIA)    Difficulty of Paying Living Expenses: Not hard at all  Food Insecurity: No Food Insecurity (08/24/2021)   Hunger Vital Sign    Worried About Running Out of Food in the Last Year: Never true    Ran Out of Food in the Last Year: Never true  Transportation Needs: No Transportation Needs (08/24/2021)   PRAPARE - Administrator, Civil Service (Medical): No    Lack of Transportation (Non-Medical): No  Physical Activity: Inactive (08/24/2021)   Exercise Vital Sign    Days of Exercise per Week: 0 days    Minutes of Exercise per Session: 0 min  Stress: No Stress Concern Present (08/24/2021)  [employer] of Occupational Health - Occupational Stress Questionnaire    Feeling of Stress : Not at all  Social Connections: Socially Isolated (08/24/2021)   Social Connection and Isolation Panel [NHANES]    Frequency of Communication with Friends and Family: More than three times a week    Frequency of Social Gatherings with Friends and Family: More than three times a week    Attends Religious Services: Never    Database administrator or Organizations: No    Attends Banker Meetings: Never    Marital Status: Widowed  Intimate Partner Violence: Not At Risk (08/24/2021)   Humiliation, Afraid, Rape, and  Kick questionnaire    Fear of Current or Ex-Partner: No    Emotionally Abused: No    Physically Abused: No    Sexually Abused: No    Past Surgical History:  Procedure Laterality Date   COLONOSCOPY  11/27/2012   LUMBAR LAMINECTOMY      Family History  Problem Relation Age of Onset   Colon cancer Father 62   Hypertension Father    Diabetes Father    Heart attack Father    Colon polyps Neg Hx    Esophageal cancer Neg Hx    Rectal cancer Neg Hx    Stomach cancer Neg Hx     No Known Allergies  Current Outpatient Medications on File Prior to Visit  Medication Sig Dispense Refill   albuterol (PROVENTIL) (2.5 MG/3ML) 0.083% nebulizer solution Take 3 mLs (2.5 mg total) by nebulization every 6 (six) hours as needed for wheezing or shortness of breath. Dx code: J44.9 75 mL 5   albuterol (VENTOLIN HFA) 108 (90 Base) MCG/ACT inhaler Inhale 1-2 puffs into the lungs every 6 (six) hours as needed for wheezing or shortness of breath. 9 g 5   amLODipine (NORVASC) 5 MG tablet TAKE 1 TABLET(5 MG) BY MOUTH DAILY 90 tablet 1   atorvastatin (LIPITOR) 40 MG tablet TAKE 1 TABLET(40 MG) BY MOUTH DAILY 90 tablet 0   azithromycin (ZITHROMAX) 250 MG tablet Take as directed 6 tablet 0   benzonatate (TESSALON) 100 MG capsule Take 1 capsule (100 mg total) by mouth 3 (three) times daily as needed for cough. 30 capsule 0   citalopram (CELEXA) 40 MG tablet TAKE 1 TABLET(40 MG) BY MOUTH DAILY 90 tablet 0   Fluticasone-Umeclidin-Vilant (TRELEGY ELLIPTA) 100-62.5-25 MCG/ACT AEPB Inhale 1 puff into the lungs daily. 60 each 11   Fluticasone-Umeclidin-Vilant (TRELEGY ELLIPTA) 100-62.5-25 MCG/ACT AEPB Inhale 1 puff into the lungs daily. 1 each 0   ibuprofen (ADVIL) 200 MG tablet Take 400 mg by mouth every 6 (six) hours as needed.     ketoconazole (NIZORAL) 2 % cream Apply 1 application topically daily. 15 g 0   LORazepam (ATIVAN) 0.5 MG tablet TAKE 1 TABLET(0.5 MG) BY MOUTH TWICE DAILY 60 tablet 1   losartan (COZAAR)  50 MG tablet TAKE 1 TABLET(50 MG) BY MOUTH DAILY 90 tablet 1   omeprazole (PRILOSEC) 40 MG capsule Take 1 capsule (40 mg total) by mouth daily. 90 capsule 3   potassium chloride (KLOR-CON M) 10 MEQ tablet Take 1 tablet (10 mEq total) by mouth daily. 7 tablet 0   tamsulosin (FLOMAX) 0.4 MG CAPS capsule Take 1 capsule (0.4 mg total) by mouth daily. 90 capsule 3   No current facility-administered medications on file prior to visit.    BP 124/66   Pulse 100   Temp 98.2 F (36.8 C)   Resp 18  Ht 5\' 9"  (1.753 m)   Wt 195 lb 9.6 oz (88.7 kg)   SpO2 92%   BMI 28.89 kg/m        Objective:   Physical Exam  General Mental Status- Alert. General Appearance- Not in acute distress.   Skin General: Color- Normal Color. Moisture- Normal Moisture.  Neck Carotid Arteries- Normal color. Moisture- Normal Moisture. No carotid bruits. No JVD.  Chest and Lung Exam Auscultation: Breath Sounds:-lungs clear. Deep breathing today compared to last week.  Cardiovascular Auscultation:Rythm- Regular. Murmurs & Other Heart Sounds:Auscultation of the heart reveals- No Murmurs.  Abdomen Inspection:-Inspeection Normal. Palpation/Percussion:Note:No mass. Palpation and Percussion of the abdomen reveal- Non Tender, Non Distended + BS, no rebound or guarding.  Neurologic Cranial Nerve exam:- CN III-XII intact(No nystagmus), symmetric smile. Strength:- 5/5 equal and symmetric strength both upper and lower extremities.       Assessment & Plan:   Patient Instructions  Improved copd exacerbation/bronchitis. Medrol and azithromycin  seems to have helped a lot.  Continue treelegy. I am asking staff to see if you need to travel to [facility] device store and show them you machine as I am not sure what parts you need or if we can get [facility]/or other company to come to your house.  [facility]  number is [phone]. Will try to update you when I talk with my staff.  Pcv 20 vaccine thru pharmacy.  Around  sept flu vaccine. You decline future covid vaccine.  Please follow up with pulmonologist early September or sooner if needed.  Follow up with me late September or early October.    Esperanza Richters, PA-C

## 2023-07-09 NOTE — Telephone Encounter (Signed)
Esperanza Richters, PA-C      07/09/23  1:39 PM Note Pt has a neb machine which he states is not working correctly. He states he needs mor than just tubing. Not sure what he needs. Machine is 68 years old. He lives in [place]. Has copd. Can we refer to [facility]. Or do they go out to pt houses etc? Stop by my station if you can.

## 2023-07-09 NOTE — Telephone Encounter (Signed)
Pt needs to call pulmonary or send message to pulmonary.

## 2023-07-09 NOTE — Patient Instructions (Addendum)
Improved copd exacerbation/bronchitis. Medrol and azithromycin  seems to have helped a lot.  Continue treelegy. I am asking staff to see if you need to travel to Medical device store and show them you machine as I am not sure what parts you need or if we can get [facility]/or other company to come to your house.  [facility]  number is [phone]. Will try to update you when I talk with my staff.  Pcv 20 vaccine thru pharmacy.  Around sept flu vaccine. You decline future covid vaccine.  Please follow up with pulmonologist early September or sooner if needed.  Follow up with me late September or early October.

## 2023-07-14 ENCOUNTER — Other Ambulatory Visit: Payer: Self-pay | Admitting: Medical

## 2023-07-20 DIAGNOSIS — H2512 Age-related nuclear cataract, left eye: Secondary | ICD-10-CM | POA: Diagnosis not present

## 2023-07-20 DIAGNOSIS — I10 Essential (primary) hypertension: Secondary | ICD-10-CM | POA: Diagnosis not present

## 2023-07-20 DIAGNOSIS — H278 Other specified disorders of lens: Secondary | ICD-10-CM | POA: Diagnosis not present

## 2023-07-20 DIAGNOSIS — H2181 Floppy iris syndrome: Secondary | ICD-10-CM | POA: Diagnosis not present

## 2023-07-20 DIAGNOSIS — Z7951 Long term (current) use of inhaled steroids: Secondary | ICD-10-CM | POA: Diagnosis not present

## 2023-07-20 DIAGNOSIS — J449 Chronic obstructive pulmonary disease, unspecified: Secondary | ICD-10-CM | POA: Diagnosis not present

## 2023-07-20 DIAGNOSIS — F17210 Nicotine dependence, cigarettes, uncomplicated: Secondary | ICD-10-CM | POA: Diagnosis not present

## 2023-07-21 DIAGNOSIS — Z9842 Cataract extraction status, left eye: Secondary | ICD-10-CM | POA: Diagnosis not present

## 2023-07-21 DIAGNOSIS — H53001 Unspecified amblyopia, right eye: Secondary | ICD-10-CM | POA: Diagnosis not present

## 2023-08-20 ENCOUNTER — Other Ambulatory Visit: Payer: Self-pay | Admitting: Medical

## 2023-08-20 NOTE — Telephone Encounter (Signed)
Requesting: lorazepam 0.5mg   Contract: 09/08/22 UDS: 09/08/22 Last Visit: 07/09/23 Next Visit: 10/01/23 Last Refill: 06/16/23 #60 and 1RF   Please Advise

## 2023-08-21 ENCOUNTER — Other Ambulatory Visit: Payer: Self-pay | Admitting: Medical

## 2023-08-22 DIAGNOSIS — H53001 Unspecified amblyopia, right eye: Secondary | ICD-10-CM | POA: Diagnosis not present

## 2023-08-22 DIAGNOSIS — Z9842 Cataract extraction status, left eye: Secondary | ICD-10-CM | POA: Diagnosis not present

## 2023-08-22 DIAGNOSIS — H2511 Age-related nuclear cataract, right eye: Secondary | ICD-10-CM | POA: Diagnosis not present

## 2023-08-22 DIAGNOSIS — H35352 Cystoid macular degeneration, left eye: Secondary | ICD-10-CM | POA: Diagnosis not present

## 2023-08-22 NOTE — Telephone Encounter (Signed)
Rx refill sent.

## 2023-09-04 NOTE — Progress Notes (Signed)
Pt did not answer phone for AWV.   This encounter was created in error - please disregard.

## 2023-09-19 DIAGNOSIS — H53001 Unspecified amblyopia, right eye: Secondary | ICD-10-CM | POA: Diagnosis not present

## 2023-09-19 DIAGNOSIS — H35352 Cystoid macular degeneration, left eye: Secondary | ICD-10-CM | POA: Diagnosis not present

## 2023-09-19 DIAGNOSIS — Z9842 Cataract extraction status, left eye: Secondary | ICD-10-CM | POA: Diagnosis not present

## 2023-09-19 DIAGNOSIS — H2511 Age-related nuclear cataract, right eye: Secondary | ICD-10-CM | POA: Diagnosis not present

## 2023-09-21 ENCOUNTER — Telehealth: Payer: Self-pay | Admitting: Medical

## 2023-09-21 NOTE — Telephone Encounter (Signed)
Requesting: ativan Contract:09/13/22 UDS:09/13/22 Last Visit:07/09/23 Next Visit:10/01/23 Last Refill:08/22/23  Please Advise

## 2023-09-21 NOTE — Telephone Encounter (Signed)
I saw pt was not up to date on contract and uds. Gave 5 day rx. Call pt and let him know reason gave limited number. Let me know what he says. Can send another brief rx if he feels 10 tab not enough to get him to oct 7 appointment. Explain can't rx 60 tab when contract and uds not up to date. Usually 5 day limit when not under contract/uds not up to date

## 2023-09-28 NOTE — Telephone Encounter (Signed)
Pt called to inquire into the reasoning for the 5 day supply. Advised that due to contract/UDS not being up to date, we were limited as to what we could send in prior to his appt. Pt asked if another supply could be sent in to get him to his appt on 10.7.24.

## 2023-10-01 ENCOUNTER — Encounter: Payer: Self-pay | Admitting: Medical

## 2023-10-01 ENCOUNTER — Ambulatory Visit (INDEPENDENT_AMBULATORY_CARE_PROVIDER_SITE_OTHER): Payer: PPO | Admitting: Medical

## 2023-10-01 VITALS — BP 118/63 | HR 99 | Temp 98.8°F | Resp 22 | Ht 70.0 in | Wt 194.8 lb

## 2023-10-01 DIAGNOSIS — J432 Centrilobular emphysema: Secondary | ICD-10-CM | POA: Diagnosis not present

## 2023-10-01 DIAGNOSIS — Z79899 Other long term (current) drug therapy: Secondary | ICD-10-CM | POA: Diagnosis not present

## 2023-10-01 DIAGNOSIS — F411 Generalized anxiety disorder: Secondary | ICD-10-CM

## 2023-10-01 DIAGNOSIS — R0982 Postnasal drip: Secondary | ICD-10-CM | POA: Diagnosis not present

## 2023-10-01 DIAGNOSIS — Z23 Encounter for immunization: Secondary | ICD-10-CM

## 2023-10-01 NOTE — Progress Notes (Signed)
Subjective:    Patient ID: Trevor Mack, male    DOB: [birth year], 67 y.o.   MRN: [id number]  HPI   Pt in for controlled med visit.  Pt anxiety is controlled with ativan. uds and contract up to date but about to need renewal by end of this month..  Contract and uds due today.  Copd stable presently. No exacerbation type symptoms.  See hpi on pnd recently and mild st one week ago.  Review of Systems  Constitutional:  Negative for chills, fatigue and fever.  HENT:  Negative for congestion, ear pain and postnasal drip.        One week ago had transient st but now normal.   Respiratory:  Negative for chest tightness, shortness of breath and wheezing.   Cardiovascular:  Negative for chest pain and palpitations.  Gastrointestinal:  Negative for abdominal pain.  Genitourinary:  Negative for dysuria and flank pain.  Musculoskeletal:  Negative for back pain and joint swelling.  Skin:  Negative for rash.  Neurological:  Negative for dizziness and light-headedness.  Hematological:  Negative for adenopathy. Does not bruise/bleed easily.  Psychiatric/Behavioral:  Negative for behavioral problems and decreased concentration.      Past Medical History:  Diagnosis Date   ANXIETY 07/23/2008   COLONIC POLYPS, HX OF 03/26/2006   MULTIPLE FRAGMENTS OF TUBULAR ADENOMAS POLYPS   COPD (chronic obstructive pulmonary disease) (HCC)    Diverticulosis    Family history of colon cancer    Father    GERD 07/23/2008   Hepatitis C    HYPERLIPIDEMIA 07/23/2008   HYPERTENSION 07/23/2008   TENDINITIS, LEFT THUMB 07/15/2010     Social History   Socioeconomic History   Marital status: Widowed    Spouse name: Not on file   Number of children: 0   Years of education: Not on file   Highest education level: Not on file  Occupational History    Employer: [employer]  Tobacco Use   Smoking status: Every Day    Current packs/day: 1.50    Average packs/day: 1.5 packs/day for 49.0 years (73.5  ttl pk-yrs)    Types: Cigarettes   Smokeless tobacco: Never  Vaping Use   Vaping status: Never Used  Substance and Sexual Activity   Alcohol use: No   Drug use: No   Sexual activity: Not on file  Other Topics Concern   Not on file  Social History Narrative   Not on file   Social Determinants of Health   Financial Resource Strain: Low Risk  (08/24/2021)   Overall Financial Resource Strain (CARDIA)    Difficulty of Paying Living Expenses: Not hard at all  Food Insecurity: No Food Insecurity (08/24/2021)   Hunger Vital Sign    Worried About Running Out of Food in the Last Year: Never true    Ran Out of Food in the Last Year: Never true  Transportation Needs: No Transportation Needs (08/24/2021)   PRAPARE - Administrator, Civil Service (Medical): No    Lack of Transportation (Non-Medical): No  Physical Activity: Inactive (08/24/2021)   Exercise Vital Sign    Days of Exercise per Week: 0 days    Minutes of Exercise per Session: 0 min  Stress: No Stress Concern Present (08/24/2021)   [employer] of Occupational Health - Occupational Stress Questionnaire    Feeling of Stress : Not at all  Social Connections: Socially Isolated (08/24/2021)   Social Connection and Isolation Panel [NHANES]  Frequency of Communication with Friends and Family: More than three times a week    Frequency of Social Gatherings with Friends and Family: More than three times a week    Attends Religious Services: Never    Database administrator or Organizations: No    Attends Banker Meetings: Never    Marital Status: Widowed  Intimate Partner Violence: Not At Risk (08/24/2021)   Humiliation, Afraid, Rape, and Kick questionnaire    Fear of Current or Ex-Partner: No    Emotionally Abused: No    Physically Abused: No    Sexually Abused: No    Past Surgical History:  Procedure Laterality Date   COLONOSCOPY  11/27/2012   LUMBAR LAMINECTOMY      Family History  Problem  Relation Age of Onset   Colon cancer Father 35   Hypertension Father    Diabetes Father    Heart attack Father    Colon polyps Neg Hx    Esophageal cancer Neg Hx    Rectal cancer Neg Hx    Stomach cancer Neg Hx     No Known Allergies  Current Outpatient Medications on File Prior to Visit  Medication Sig Dispense Refill   albuterol (PROVENTIL) (2.5 MG/3ML) 0.083% nebulizer solution Take 3 mLs (2.5 mg total) by nebulization every 6 (six) hours as needed for wheezing or shortness of breath. Dx code: J44.9 75 mL 5   albuterol (VENTOLIN HFA) 108 (90 Base) MCG/ACT inhaler Inhale 1-2 puffs into the lungs every 6 (six) hours as needed for wheezing or shortness of breath. 9 g 5   amLODipine (NORVASC) 5 MG tablet TAKE 1 TABLET(5 MG) BY MOUTH DAILY 90 tablet 1   atorvastatin (LIPITOR) 40 MG tablet Take 1 tablet (40 mg total) by mouth daily. 90 tablet 0   azithromycin (ZITHROMAX) 250 MG tablet Take as directed 6 tablet 0   benzonatate (TESSALON) 100 MG capsule Take 1 capsule (100 mg total) by mouth 3 (three) times daily as needed for cough. 30 capsule 0   citalopram (CELEXA) 40 MG tablet TAKE 1 TABLET(40 MG) BY MOUTH DAILY 90 tablet 0   Fluticasone-Umeclidin-Vilant (TRELEGY ELLIPTA) 100-62.5-25 MCG/ACT AEPB Inhale 1 puff into the lungs daily. 60 each 11   Fluticasone-Umeclidin-Vilant (TRELEGY ELLIPTA) 100-62.5-25 MCG/ACT AEPB Inhale 1 puff into the lungs daily. 1 each 0   ibuprofen (ADVIL) 200 MG tablet Take 400 mg by mouth every 6 (six) hours as needed.     ketoconazole (NIZORAL) 2 % cream Apply 1 application topically daily. 15 g 0   LORazepam (ATIVAN) 0.5 MG tablet TAKE 1 TABLET(0.5 MG) BY MOUTH TWICE DAILY 10 tablet 0   losartan (COZAAR) 50 MG tablet TAKE 1 TABLET(50 MG) BY MOUTH DAILY 90 tablet 1   omeprazole (PRILOSEC) 40 MG capsule Take 1 capsule (40 mg total) by mouth daily. 90 capsule 3   potassium chloride (KLOR-CON M) 10 MEQ tablet Take 1 tablet (10 mEq total) by mouth daily. 7 tablet 0    tamsulosin (FLOMAX) 0.4 MG CAPS capsule Take 1 capsule (0.4 mg total) by mouth daily. 90 capsule 3   No current facility-administered medications on file prior to visit.    BP 118/63   Pulse 99   Temp 98.8 F (37.1 C) (Oral)   Resp (!) 22   Ht 5\' 10"  (1.778 m)   Wt 194 lb 12.8 oz (88.4 kg)   SpO2 93%   BMI 27.95 kg/m        Objective:  Physical Exam   General Mental Status- Alert. General Appearance- Not in acute distress.   Skin General: Color- Normal Color. Moisture- Normal Moisture.  Neck Carotid Arteries- Normal color. Moisture- Normal Moisture. No carotid bruits. No JVD.  Chest and Lung Exam Auscultation: Breath Sounds:-Normal.  Cardiovascular Auscultation:Rythm- Regular. Murmurs & Other Heart Sounds:Auscultation of the heart reveals- No Murmurs.  Abdomen Inspection:-Inspeection Normal. Palpation/Percussion:Note:No mass. Palpation and Percussion of the abdomen reveal- Non Tender, Non Distended + BS, no rebound or guarding.   Neurologic Cranial Nerve exam:- CN III-XII intact(No nystagmus), symmetric smile. Strength:- 5/5 equal and symmetric strength both upper and lower extremities.    Heent- no sinus pressure. Normal pharynx, no exudate.   Lower ext- no pedal edema, calfs symmetric.    Assessment & Plan:   Patient Instructions  1. High risk medication use Contract signed and uds done today - Drug Monitoring Panel [id number] , Urine  2. Anxiety state - refilled ativan rx today with 2 refills.  - Drug Monitoring Panel [id number] , Urine  3. Post-nasal drainage -xyzal anthistamine. St one week ago for one day I think from pnd/allergy related. If signs/symptoms change or worsen let me know  4. Centrilobular emphysema (HCC) -stable. Continue treelegy and albuterol neb treatments as needed. -continue to follow up with pulmonologist.  Flu vaccine today. Pcv 20, covid and rsv thru pharmacy.  Follow up 6 months or sooner if needed    [employer],  PA-C

## 2023-10-01 NOTE — Patient Instructions (Addendum)
1. High risk medication use Contract signed(placed in media to scan) and uds done today - Drug Monitoring Panel [id number] , Urine  2. Anxiety state - refilled ativan rx today with 2 refills.  - Drug Monitoring Panel [id number] , Urine  3. Post-nasal drainage -xyzal anthistamine. St one week ago for one day I think from pnd/allergy related. If signs/symptoms change or worsen let me know  4. Centrilobular emphysema (HCC) -stable. Continue treelegy and albuterol neb treatments as needed. -continue to follow up with pulmonologist.  Flu vaccine today. Pcv 20, covid and rsv thru pharmacy.  Follow up 6 months or sooner if needed

## 2023-10-01 NOTE — Addendum Note (Signed)
Addended by: Maximino Sarin on: 10/01/2023 01:57 PM   Modules accepted: Orders

## 2023-10-02 MED ORDER — LORAZEPAM 0.5 MG PO TABS: 0.5000 mg | ORAL_TABLET | Freq: Two times a day (BID) | ORAL | 2 refills | Status: DC

## 2023-10-02 NOTE — Telephone Encounter (Signed)
Pt called back and stated that he left his appointment yesterday under the impression that he would have a prescription ready today for LORazepam (ATIVAN) 0.5 MG tablet. However, refill was not sent in. Please advise pt.

## 2023-10-02 NOTE — Addendum Note (Signed)
Addended by: Gwenevere Abbot on: 10/02/2023 05:25 PM   Modules accepted: Orders

## 2023-10-03 LAB — DRUG MONITORING PANEL 376104, URINE
Amphetamines: NEGATIVE ng/mL (ref <500)
Barbiturates: NEGATIVE ng/mL (ref <300)
Benzodiazepines: NEGATIVE ng/mL (ref <100)
Cocaine Metabolite: NEGATIVE ng/mL (ref <150)
Desmethyltramadol: NEGATIVE ng/mL (ref <100)
Opiates: NEGATIVE ng/mL (ref <100)
Oxycodone: NEGATIVE ng/mL (ref <100)
Tramadol: NEGATIVE ng/mL (ref <100)

## 2023-10-03 LAB — DM TEMPLATE

## 2023-10-15 ENCOUNTER — Other Ambulatory Visit: Payer: Self-pay | Admitting: Medical

## 2023-11-27 ENCOUNTER — Other Ambulatory Visit: Payer: Self-pay | Admitting: Medical

## 2023-11-27 ENCOUNTER — Other Ambulatory Visit: Payer: Self-pay | Admitting: Pulmonary Disease

## 2023-11-29 ENCOUNTER — Other Ambulatory Visit: Payer: Self-pay | Admitting: Medical

## 2023-12-19 ENCOUNTER — Other Ambulatory Visit: Payer: Self-pay | Admitting: Medical

## 2024-01-01 ENCOUNTER — Other Ambulatory Visit: Payer: Self-pay | Admitting: Medical

## 2024-01-01 NOTE — Telephone Encounter (Signed)
 Requesting: lorazepam 0.5mg   Contract: 10/01/23 UDS: 10/01/23 Last Visit: 10/01/23 Next Visit: 03/31/24 Last Refill: 10/02/23 #60 and  2rf  Please Advise

## 2024-01-03 ENCOUNTER — Telehealth: Payer: Self-pay | Admitting: Medical

## 2024-01-03 NOTE — Telephone Encounter (Signed)
 Would you call pt pharmacy and double check whether or not I sent refill too early to fill his ativan per his request. I filled early this morning but not sure if he still has refill. If so  then ask them to fill his new prescription when due.

## 2024-01-03 NOTE — Telephone Encounter (Signed)
 Rx sent to pharmacy

## 2024-01-03 NOTE — Telephone Encounter (Signed)
 Spoke with pharmacy staff, refill was not an early refill. Last refill was 12/03/2023, medication is ready for pt pick up.

## 2024-01-26 ENCOUNTER — Other Ambulatory Visit: Payer: Self-pay | Admitting: Medical

## 2024-02-11 ENCOUNTER — Other Ambulatory Visit: Payer: Self-pay | Admitting: Medical

## 2024-02-11 NOTE — Telephone Encounter (Signed)
Last Fill: 01/28/24 90 tabs/0 refills  Last OV: 10/01/23 Next OV: 03/31/24  Routing to provider for review/authorization.

## 2024-02-11 NOTE — Telephone Encounter (Signed)
Copied from CRM [id number]. Topic: Clinical - Medication Refill >> Feb 11, 2024  4:57 PM Sonny Dandy B wrote: Most Recent Primary Care Visit:  Provider: Esperanza Richters  Department: [facility]  Visit Type: OFFICE VISIT  Date: 10/01/2023  Medication: citalopram (CELEXA) 40 MG tablet  Has the patient contacted their pharmacy? Yes (Agent: If no, request that the patient contact the pharmacy for the refill. If patient does not wish to contact the pharmacy document the reason why and proceed with request.) (Agent: If yes, when and what did the pharmacy advise?)  Is this the correct pharmacy for this prescription? Yes If no, delete pharmacy and type the correct one.  This is the patient's preferred pharmacy:  [id number] - [place], [place] - [address] AT [facility] [address] [place] [address] Phone: [phone] Fax: [phone]   Has the prescription been filled recently? Yes  Is the patient out of the medication? Yes  Has the patient been seen for an appointment in the last year OR does the patient have an upcoming appointment? Yes  Can we respond through MyChart? Yes  Agent: Please be advised that Rx refills may take up to 3 business days. We ask that you follow-up with your pharmacy.

## 2024-02-12 MED ORDER — CITALOPRAM HYDROBROMIDE 40 MG PO TABS: 40.0000 mg | ORAL_TABLET | Freq: Every day | ORAL | 0 refills | Status: DC

## 2024-02-22 ENCOUNTER — Other Ambulatory Visit: Payer: Self-pay | Admitting: Medical

## 2024-02-22 ENCOUNTER — Other Ambulatory Visit: Payer: Self-pay | Admitting: Emergency Medicine

## 2024-02-22 NOTE — Telephone Encounter (Signed)
 Copied from CRM [id number]. Topic: Clinical - Medication Refill >> Feb 22, 2024  3:24 PM Eunice Blase wrote: Most Recent Primary Care Visit:  Provider: Esperanza Richters  Department: [facility]  Visit Type: OFFICE VISIT  Date: 10/01/2023  Medication: LORazepam (ATIVAN) 0.5 MG tablet  Has the patient contacted their pharmacy? Yes (Agent: If no, request that the patient contact the pharmacy for the refill. If patient does not wish to contact the pharmacy document the reason why and proceed with request.) (Agent: If yes, when and what did the pharmacy advise?)Pharmacy needs PCP approval  Is this the correct pharmacy for this prescription? Yes If no, delete pharmacy and type the correct one.  This is the patient's preferred pharmacy:  [id number] - [place], [place] - [address] AT [facility] [address] [place] [address] Phone: [phone] Fax: [phone]   Has the prescription been filled recently? Yes  Is the patient out of the medication? Yes  Has the patient been seen for an appointment in the last year OR does the patient have an upcoming appointment? Yes  Can we respond through MyChart? Yes  Agent: Please be advised that Rx refills may take up to 3 business days. We ask that you follow-up with your pharmacy.

## 2024-02-22 NOTE — Telephone Encounter (Signed)
 Requesting: LORazepam (ATIVAN) 0.5 MG tablet  Last Visit: 10/01/2023 Next Visit: 03/31/2024 Last Refill: 01/03/2024  Please Advise

## 2024-02-22 NOTE — Telephone Encounter (Signed)
 Requesting: ativan Contract:12/05/23 UDS:09/28/23 Last Visit:09/28/23 Next Visit:03/31/24 Last Refill:01/03/24  Please Advise

## 2024-02-23 NOTE — Telephone Encounter (Signed)
 Rx refill sent to pharmacy.

## 2024-03-02 ENCOUNTER — Other Ambulatory Visit: Payer: Self-pay | Admitting: Medical

## 2024-03-14 ENCOUNTER — Ambulatory Visit (HOSPITAL_BASED_OUTPATIENT_CLINIC_OR_DEPARTMENT_OTHER)
Admission: RE | Admit: 2024-03-14 | Discharge: 2024-03-14 | Disposition: A | Discharge status: Home / Self Care | Source: Ambulatory Visit | Attending: Medical | Admitting: Medical

## 2024-03-14 DIAGNOSIS — F17210 Nicotine dependence, cigarettes, uncomplicated: Secondary | ICD-10-CM | POA: Diagnosis not present

## 2024-03-14 DIAGNOSIS — Z122 Encounter for screening for malignant neoplasm of respiratory organs: Secondary | ICD-10-CM | POA: Insufficient documentation

## 2024-03-14 DIAGNOSIS — Z87891 Personal history of nicotine dependence: Secondary | ICD-10-CM

## 2024-03-15 ENCOUNTER — Other Ambulatory Visit: Payer: Self-pay | Admitting: Pulmonary Disease

## 2024-03-15 DIAGNOSIS — J411 Mucopurulent chronic bronchitis: Secondary | ICD-10-CM

## 2024-03-20 ENCOUNTER — Other Ambulatory Visit: Payer: Self-pay | Admitting: Pulmonary Disease

## 2024-03-31 ENCOUNTER — Ambulatory Visit (INDEPENDENT_AMBULATORY_CARE_PROVIDER_SITE_OTHER): Payer: PPO | Admitting: Medical

## 2024-03-31 VITALS — BP 118/64 | HR 72 | Temp 98.2°F | Resp 20 | Ht 70.0 in | Wt 175.0 lb

## 2024-03-31 DIAGNOSIS — I10 Essential (primary) hypertension: Secondary | ICD-10-CM | POA: Diagnosis not present

## 2024-03-31 DIAGNOSIS — F419 Anxiety disorder, unspecified: Secondary | ICD-10-CM | POA: Diagnosis not present

## 2024-03-31 DIAGNOSIS — R972 Elevated prostate specific antigen [PSA]: Secondary | ICD-10-CM | POA: Diagnosis not present

## 2024-03-31 DIAGNOSIS — Z1211 Encounter for screening for malignant neoplasm of colon: Secondary | ICD-10-CM

## 2024-03-31 DIAGNOSIS — R739 Hyperglycemia, unspecified: Secondary | ICD-10-CM | POA: Diagnosis not present

## 2024-03-31 LAB — COMPREHENSIVE METABOLIC PANEL WITH GFR
ALT: 17 U/L (ref 0–53)
AST: 12 U/L (ref 0–37)
Albumin: 4.4 g/dL (ref 3.5–5.2)
Alkaline Phosphatase: 103 U/L (ref 39–117)
BUN: 17 mg/dL (ref 6–23)
CO2: 27 meq/L (ref 19–32)
Calcium: 8.8 mg/dL (ref 8.4–10.5)
Chloride: 105 meq/L (ref 96–112)
Creatinine, Ser: 1.13 mg/dL (ref 0.40–1.50)
GFR: 67.52 mL/min (ref >60.00)
Glucose, Bld: 84 mg/dL (ref 70–99)
Potassium: 3.8 meq/L (ref 3.5–5.1)
Sodium: 143 meq/L (ref 135–145)
Total Bilirubin: 0.4 mg/dL (ref 0.2–1.2)
Total Protein: 6.8 g/dL (ref 6.0–8.3)

## 2024-03-31 LAB — HEMOGLOBIN A1C: Hgb A1c MFr Bld: 5.5 % (ref 4.6–6.5)

## 2024-03-31 LAB — PSA: PSA: 7.75 ng/mL — ABNORMAL HIGH (ref 0.10–4.00)

## 2024-03-31 NOTE — Addendum Note (Signed)
 Addended by: Gwenevere Abbot on: 03/31/2024 07:40 PM   Modules accepted: Orders

## 2024-03-31 NOTE — Patient Instructions (Signed)
 Chronic Obstructive Pulmonary Disease (COPD) COPD well-managed with albuterol and nebulizer treatments, no recent exacerbations. - Ensure adequate supply of albuterol and nebulizer solution. - Encourage follow-up with pulmonologist for ongoing management. Continue trelegy and recommend stop smoking as recommended in past.  Influenza Hospitalized for influenza two months ago, improved with antiviral treatment and prednisone. Prior vaccination likely reduced severity. - Continue annual influenza vaccination to reduce severity of potential future infections.  Elevated PSA Elevated PSA in 2023.   - Order PSA test to reassess levels. - If PSA remains elevated, refer to a different urologist for further evaluation.  Hypertension Hypertension managed with losartan 50 mg daily and amlodipine 5 mg daily. Well controlled.  Anxiety Anxiety well-controlled with Ativan 0.5 mg twice daily, no adverse side effects. Reports difficulty obtaining timely refills. - Advise to request refills five days in advance to avoid running out of medication. - Ensure current prescription for Ativan with refills available.  General Health Maintenance Received pneumonia, COVID, and influenza vaccines. Plans to receive shingles vaccine thru pharmacy. Overdue for colonoscopy due to history of multiple polyps. - Encourage receiving shingles vaccine and update records accordingly. - Refer to GI for colonoscopy due to being overdue and history of multiple polyps.  Follow-up Follow-up needs include lab results review and potential earlier follow-up based on lab findings. - Review lab results including A1c, metabolic panel, and PSA. - Schedule follow-up visit in six months for controlled medication review, or sooner if lab results indicate.

## 2024-03-31 NOTE — Progress Notes (Signed)
 Subjective:    Patient ID: Trevor Mack, male    DOB: 01-24-1956, 68 y.o.   MRN: [id number]  HPI  Trevor Mack is a 68 year old male with COPD who presents for a controlled medication visit and follow-up on recent hospitalizations and vaccinations.  Approximately two months ago, he was hospitalized for three days due to influenza despite having received a flu shot. During the hospital stay, he was treated with antivirals and prednisone and was discharged with a prescription for albuterol inhalers and nebulizer solution. He notes improvement in his condition since then.  He has received a pneumonia shot at [facility] and does not need another one. He also received a COVID vaccine and a flu shot.  He has a history of COPD and reports stable breathing with no recent exacerbations. He uses albuterol as needed and has not yet scheduled an appointment with his pulmonologist.  He is currently taking Ativan 0.5 mg twice daily for anxiety, which is well-controlled without adverse side effects. He experiences difficulty obtaining timely refills. He also takes losartan 50 mg once daily and amlodipine 5 mg daily for blood pressure management.  In 2023, he had an elevated PSA value but has not followed up with a urologist since a previous unsatisfactory visit. He reports frequent urination but no other symptoms. He is awaiting further evaluation of his PSA levels.  He mentions a lung cancer screening conducted two weeks ago on March 14, 2024, but has not received the results yet.  He recalls a colonoscopy on February 07, 2021, which revealed 14 polyps, and he was advised to repeat the procedure in a year, making him currently overdue.     Review of Systems  Constitutional:  Negative for chills, fatigue and fever.  HENT:  Negative for dental problem.   Respiratory:  Negative for cough, chest tightness, shortness of breath and wheezing.   Cardiovascular:  Negative for chest pain and  palpitations.  Genitourinary:  Positive for frequency.       Hx of frequent urination for years. Not new.  Musculoskeletal:  Negative for back pain.  Neurological:  Negative for dizziness, weakness and numbness.  Hematological:  Negative for adenopathy. Does not bruise/bleed easily.  Psychiatric/Behavioral:  Positive for behavioral problems and confusion.     Past Medical History:  Diagnosis Date   ANXIETY 07/23/2008   COLONIC POLYPS, HX OF 03/26/2006   MULTIPLE FRAGMENTS OF TUBULAR ADENOMAS POLYPS   COPD (chronic obstructive pulmonary disease) (HCC)    Diverticulosis    Family history of colon cancer    Father    GERD 07/23/2008   Hepatitis C    HYPERLIPIDEMIA 07/23/2008   HYPERTENSION 07/23/2008   TENDINITIS, LEFT THUMB 07/15/2010     Social History   Socioeconomic History   Marital status: Widowed    Spouse name: Not on file   Number of children: 0   Years of education: Not on file   Highest education level: Not on file  Occupational History    Employer: [employer]  Tobacco Use   Smoking status: Every Day    Current packs/day: 1.50    Average packs/day: 1.5 packs/day for 49.0 years (73.5 ttl pk-yrs)    Types: Cigarettes   Smokeless tobacco: Never  Vaping Use   Vaping status: Never Used  Substance and Sexual Activity   Alcohol use: No   Drug use: No   Sexual activity: Not on file  Other Topics Concern   Not on file  Social History Narrative   Not on file   Social Drivers of Health   Financial Resource Strain: Low Risk  (08/24/2021)   Overall Financial Resource Strain (CARDIA)    Difficulty of Paying Living Expenses: Not hard at all  Food Insecurity: No Food Insecurity (08/24/2021)   Hunger Vital Sign    Worried About Running Out of Food in the Last Year: Never true    Ran Out of Food in the Last Year: Never true  Transportation Needs: No Transportation Needs (08/24/2021)   PRAPARE - Administrator, Civil Service (Medical): No    Lack of  Transportation (Non-Medical): No  Physical Activity: Inactive (08/24/2021)   Exercise Vital Sign    Days of Exercise per Week: 0 days    Minutes of Exercise per Session: 0 min  Stress: No Stress Concern Present (08/24/2021)   [employer] of Occupational Health - Occupational Stress Questionnaire    Feeling of Stress : Not at all  Social Connections: Socially Isolated (08/24/2021)   Social Connection and Isolation Panel [NHANES]    Frequency of Communication with Friends and Family: More than three times a week    Frequency of Social Gatherings with Friends and Family: More than three times a week    Attends Religious Services: Never    Database administrator or Organizations: No    Attends Banker Meetings: Never    Marital Status: Widowed  Intimate Partner Violence: Not At Risk (08/24/2021)   Humiliation, Afraid, Rape, and Kick questionnaire    Fear of Current or Ex-Partner: No    Emotionally Abused: No    Physically Abused: No    Sexually Abused: No    Past Surgical History:  Procedure Laterality Date   COLONOSCOPY  11/27/2012   LUMBAR LAMINECTOMY      Family History  Problem Relation Age of Onset   Colon cancer Father 13   Hypertension Father    Diabetes Father    Heart attack Father    Colon polyps Neg Hx    Esophageal cancer Neg Hx    Rectal cancer Neg Hx    Stomach cancer Neg Hx     No Known Allergies  Current Outpatient Medications on File Prior to Visit  Medication Sig Dispense Refill   albuterol (PROVENTIL) (2.5 MG/3ML) 0.083% nebulizer solution Take 3 mLs (2.5 mg total) by nebulization every 6 (six) hours as needed for wheezing or shortness of breath. Dx code: J44.9 75 mL 5   albuterol (VENTOLIN HFA) 108 (90 Base) MCG/ACT inhaler INHALE 1 TO 2 PUFFS INTO THE LUNGS EVERY 6 HOURS AS NEEDED FOR WHEEZING OR SHORTNESS OF BREATH 8.5 g 5   amLODipine (NORVASC) 5 MG tablet TAKE 1 TABLET(5 MG) BY MOUTH DAILY 90 tablet 1   atorvastatin (LIPITOR) 40 MG  tablet TAKE 1 TABLET(40 MG) BY MOUTH DAILY 90 tablet 0   azithromycin (ZITHROMAX) 250 MG tablet Take as directed 6 tablet 0   benzonatate (TESSALON) 100 MG capsule Take 1 capsule (100 mg total) by mouth 3 (three) times daily as needed for cough. 30 capsule 0   citalopram (CELEXA) 40 MG tablet Take 1 tablet (40 mg total) by mouth daily. 90 tablet 0   Fluticasone-Umeclidin-Vilant (TRELEGY ELLIPTA) 100-62.5-25 MCG/ACT AEPB Inhale 1 puff into the lungs daily. 1 each 0   Fluticasone-Umeclidin-Vilant (TRELEGY ELLIPTA) 100-62.5-25 MCG/ACT AEPB INHALE 1 PUFF INTO THE LUNGS DAILY 60 each 0   ibuprofen (ADVIL) 200 MG tablet Take 400 mg by  mouth every 6 (six) hours as needed.     ketoconazole (NIZORAL) 2 % cream Apply 1 application topically daily. 15 g 0   LORazepam (ATIVAN) 0.5 MG tablet TAKE 1 TABLET(0.5 MG) BY MOUTH TWICE DAILY 60 tablet 1   losartan (COZAAR) 50 MG tablet TAKE 1 TABLET(50 MG) BY MOUTH DAILY 90 tablet 1   omeprazole (PRILOSEC) 40 MG capsule Take 1 capsule (40 mg total) by mouth daily. 90 capsule 3   potassium chloride (KLOR-CON M) 10 MEQ tablet Take 1 tablet (10 mEq total) by mouth daily. 7 tablet 0   tamsulosin (FLOMAX) 0.4 MG CAPS capsule Take 1 capsule (0.4 mg total) by mouth daily. 90 capsule 3   No current facility-administered medications on file prior to visit.    BP 118/64   Pulse 72   Temp 98.2 F (36.8 C)   Resp 20   Ht 5\' 10"  (1.778 m)   Wt 175 lb (79.4 kg)   SpO2 97%   BMI 25.11 kg/m        Objective:   Physical Exam  General Mental Status- Alert. General Appearance- Not in acute distress.   Skin General: Color- Normal Color. Moisture- Normal Moisture.  Neck  No JVD.  Chest and Lung Exam Auscultation: Breath Sounds:-CTA  Cardiovascular Auscultation:Rythm- RRR Murmurs & Other Heart Sounds:Auscultation of the heart reveals- No Murmurs.  Abdomen Inspection:-Inspeection Normal. Palpation/Percussion:Note:No mass. Palpation and Percussion of the  abdomen reveal- Non Tender, Non Distended + BS, no rebound or guarding.   Neurologic Cranial Nerve exam:- CN III-XII intact(No nystagmus), symmetric smile. Strength:- 5/5 equal and symmetric strength both upper and lower extremities.   Lower ext- no pedal edema, negative homans signs. Calfs symmetric.     Assessment & Plan:   Chronic Obstructive Pulmonary Disease (COPD) COPD well-managed with albuterol and nebulizer treatments, no recent exacerbations. - Ensure adequate supply of albuterol and nebulizer solution. - Encourage follow-up with pulmonologist for ongoing management. Continue trelegy and recommend stop smoking as recommended in past.  Influenza Hospitalized for influenza two months ago, improved with antiviral treatment and prednisone. Prior vaccination likely reduced severity. - Continue annual influenza vaccination to reduce severity of potential future infections.  Elevated PSA Elevated PSA in 2023.   - Order PSA test to reassess levels. - If PSA remains elevated, refer to a different urologist for further evaluation.  Hypertension Hypertension managed with losartan 50 mg daily and amlodipine 5 mg daily. Well controlled.  Anxiety Anxiety well-controlled with Ativan 0.5 mg twice daily, no adverse side effects. Reports difficulty obtaining timely refills. - Advise to request refills five days in advance to avoid running out of medication. - Ensure current prescription for Ativan with refills available.  General Health Maintenance Received pneumonia, COVID, and influenza vaccines. Plans to receive shingles vaccine thru pharmacy. Overdue for colonoscopy due to history of multiple polyps. - Encourage receiving shingles vaccine and update records accordingly. - Refer to GI for colonoscopy due to being overdue and history of multiple polyps.  Follow-up Follow-up needs include lab results review and potential earlier follow-up based on lab findings. - Review lab results  including A1c, metabolic panel, and PSA. - Schedule follow-up visit in six months for controlled medication review, or sooner if lab results indicate.

## 2024-04-07 ENCOUNTER — Other Ambulatory Visit: Payer: Self-pay

## 2024-04-07 DIAGNOSIS — Z87891 Personal history of nicotine dependence: Secondary | ICD-10-CM

## 2024-04-07 DIAGNOSIS — Z122 Encounter for screening for malignant neoplasm of respiratory organs: Secondary | ICD-10-CM

## 2024-04-07 DIAGNOSIS — F17210 Nicotine dependence, cigarettes, uncomplicated: Secondary | ICD-10-CM

## 2024-04-13 ENCOUNTER — Other Ambulatory Visit: Payer: Self-pay | Admitting: Medical

## 2024-04-15 ENCOUNTER — Telehealth: Payer: Self-pay | Admitting: Medical

## 2024-04-15 NOTE — Telephone Encounter (Signed)
 Copied from CRM [id number]. Topic: Medicare AWV >> Apr 15, 2024  2:44 PM Juliana Ocean wrote: Reason for CRM: Called LVM 04/15/2024 to schedule AWV. Please schedule Virtual or Telehealth visits ONLY.   Rosalee Collins; Care Guide Ambulatory Clinical Support [place] [facility] Direct Dial: [phone]

## 2024-04-17 ENCOUNTER — Ambulatory Visit: Admitting: Urology

## 2024-04-17 NOTE — Progress Notes (Deleted)
  Assessment: 1. Elevated PSA     Plan: I personally reviewed the patient's chart including provider notes, and lab results.   Chief Complaint: No chief complaint on file.   History of Present Illness:  Trevor Mack is a 68 y.o. male who is seen in consultation from Saguier, [name], PA-C for evaluation of elevated PSA.  PSA results: 8/20 3.10 1/21 3.18 6/21 3.37 9/22 5.30 2/23 4.81 6/23 4.79 4/25 7.75   Past Medical History:  Past Medical History:  Diagnosis Date   ANXIETY 07/23/2008   COLONIC POLYPS, HX OF 03/26/2006   MULTIPLE FRAGMENTS OF TUBULAR ADENOMAS POLYPS   COPD (chronic obstructive pulmonary disease) (HCC)    Diverticulosis    Family history of colon cancer    Father    GERD 07/23/2008   Hepatitis C    HYPERLIPIDEMIA 07/23/2008   HYPERTENSION 07/23/2008   TENDINITIS, LEFT THUMB 07/15/2010    Past Surgical History:  Past Surgical History:  Procedure Laterality Date   COLONOSCOPY  11/27/2012   LUMBAR LAMINECTOMY      Allergies:  No Known Allergies  Family History:  Family History  Problem Relation Age of Onset   Colon cancer Father 76   Hypertension Father    Diabetes Father    Heart attack Father    Colon polyps Neg Hx    Esophageal cancer Neg Hx    Rectal cancer Neg Hx    Stomach cancer Neg Hx     Social History:  Social History   Tobacco Use   Smoking status: Every Day    Current packs/day: 1.50    Average packs/day: 1.5 packs/day for 49.0 years (73.5 ttl pk-yrs)    Types: Cigarettes   Smokeless tobacco: Never  Vaping Use   Vaping status: Never Used  Substance Use Topics   Alcohol use: No   Drug use: No    Review of symptoms:  Constitutional:  Negative for unexplained weight loss, night sweats, fever, chills ENT:  Negative for nose bleeds, sinus pain, painful swallowing CV:  Negative for chest pain, shortness of breath, exercise intolerance, palpitations, loss of consciousness Resp:  Negative for cough, wheezing, shortness  of breath GI:  Negative for nausea, vomiting, diarrhea, bloody stools GU:  Positives noted in HPI; otherwise negative for gross hematuria, dysuria, urinary incontinence Neuro:  Negative for seizures, poor balance, limb weakness, slurred speech Psych:  Negative for lack of energy, depression, anxiety Endocrine:  Negative for polydipsia, polyuria, symptoms of hypoglycemia (dizziness, hunger, sweating) Hematologic:  Negative for anemia, purpura, petechia, prolonged or excessive bleeding, use of anticoagulants  Allergic:  Negative for difficulty breathing or choking as a result of exposure to anything; no shellfish allergy; no allergic response (rash/itch) to materials, foods  Physical exam: There were no vitals taken for this visit. GENERAL APPEARANCE:  Well appearing, well developed, well nourished, NAD HEENT: Atraumatic, Normocephalic, oropharynx clear. NECK: Supple without lymphadenopathy or thyromegaly. LUNGS: Clear to auscultation bilaterally. HEART: Regular Rate and Rhythm without murmurs, gallops, or rubs. ABDOMEN: Soft, non-tender, No Masses. EXTREMITIES: Moves all extremities well.  Without clubbing, cyanosis, or edema. NEUROLOGIC:  Alert and oriented x 3, normal gait, CN II-XII grossly intact.  MENTAL STATUS:  Appropriate. BACK:  Non-tender to palpation.  No CVAT SKIN:  Warm, dry and intact.   GU: Penis:  {Exam; penis:5791} Meatus: {Meatus:15530} Scrotum: {pe scrotum:310183} Testis: {Exam; testicles:5790} Epididymis: {epididymis WUJW:119147} Prostate: {Exam; prostate:5793} Rectum: {rectal exam:26517}   Results: U/A:

## 2024-04-18 ENCOUNTER — Other Ambulatory Visit: Payer: Self-pay | Admitting: Pulmonary Disease

## 2024-04-18 DIAGNOSIS — J411 Mucopurulent chronic bronchitis: Secondary | ICD-10-CM

## 2024-04-21 ENCOUNTER — Other Ambulatory Visit: Payer: Self-pay | Admitting: Medical

## 2024-04-21 ENCOUNTER — Encounter: Payer: Self-pay | Admitting: Internal Medicine

## 2024-04-21 NOTE — Telephone Encounter (Signed)
 Requesting: ativan  Contract:12/05/23 UDS:12/05/23 Last Visit:03/31/24 Next Visit:n/a Last Refill:02/23/24  Please Advise

## 2024-04-23 NOTE — Telephone Encounter (Signed)
 Rx refill sent to pharmacy.

## 2024-05-19 ENCOUNTER — Other Ambulatory Visit: Payer: Self-pay | Admitting: Pulmonary Disease

## 2024-05-19 DIAGNOSIS — J411 Mucopurulent chronic bronchitis: Secondary | ICD-10-CM

## 2024-05-28 ENCOUNTER — Ambulatory Visit: Admitting: Urology

## 2024-06-04 ENCOUNTER — Other Ambulatory Visit: Payer: Self-pay | Admitting: Medical

## 2024-06-17 ENCOUNTER — Other Ambulatory Visit: Payer: Self-pay | Admitting: Pulmonary Disease

## 2024-06-17 DIAGNOSIS — J411 Mucopurulent chronic bronchitis: Secondary | ICD-10-CM

## 2024-06-18 ENCOUNTER — Telehealth: Payer: Self-pay

## 2024-06-18 ENCOUNTER — Ambulatory Visit

## 2024-06-18 NOTE — Telephone Encounter (Signed)
 9041 attempted top reach pt for pre-visit phone call. Left voicemail  1005 called pt back again and left voicemail to call back.  10:13 attempted to call patient and left voicemail to call back to reschedule his pre visit phone call for his colonoscopy on 07/02/2024 with Dr. Albertus.

## 2024-06-18 NOTE — Telephone Encounter (Signed)
 No Show. No call back.  Pre visit was not rescheduled.  Pre visit and colonoscopy were cancelled and No show letter was sent through mail.

## 2024-07-02 ENCOUNTER — Encounter: Admitting: Internal Medicine

## 2024-07-15 ENCOUNTER — Other Ambulatory Visit: Payer: Self-pay | Admitting: Pulmonary Disease

## 2024-07-15 DIAGNOSIS — J411 Mucopurulent chronic bronchitis: Secondary | ICD-10-CM

## 2024-07-15 NOTE — Telephone Encounter (Signed)
 Copied from CRM [id number]. Topic: Clinical - Medication Refill >> Jul 15, 2024  1:27 PM Rozanna G wrote: Medication: TRELEGY ELLIPTA  100-62.5-25 MCG/ACT AEPB [Pharmacy Med Name: TRELEGY ELLIPTA  100-62. INH 30P]  Has the patient contacted their pharmacy? Yes (Agent: If no, request that the patient contact the pharmacy for the refill. If patient does not wish to contact the pharmacy document the reason why and proceed with request.) (Agent: If yes, when and what did the pharmacy advise?)  This is the patient's preferred pharmacy:  [id number] - [place], [place] - [address] AT [facility] [address] [place] [address] Phone: [phone] Fax: [phone]  Is this the correct pharmacy for this prescription? Yes If no, delete pharmacy and type the correct one.   Has the prescription been filled recently? Yes  Is the patient out of the medication? No  Has the patient been seen for an appointment in the last year OR does the patient have an upcoming appointment? Yes  Can we respond through MyChart? No  Agent: Please be advised that Rx refills may take up to 3 business days. We ask that you follow-up with your pharmacy.

## 2024-07-23 ENCOUNTER — Other Ambulatory Visit: Payer: Self-pay | Admitting: Medical

## 2024-07-24 ENCOUNTER — Other Ambulatory Visit: Payer: Self-pay | Admitting: Medical

## 2024-07-24 ENCOUNTER — Telehealth: Payer: Self-pay

## 2024-07-24 ENCOUNTER — Other Ambulatory Visit: Payer: Self-pay

## 2024-07-24 MED ORDER — AMLODIPINE BESYLATE 5 MG PO TABS: 5.0000 mg | ORAL_TABLET | Freq: Every day | ORAL | 1 refills | Status: DC

## 2024-07-24 MED ORDER — OMEPRAZOLE 40 MG PO CPDR: 40.0000 mg | DELAYED_RELEASE_CAPSULE | Freq: Every day | ORAL | 3 refills | Status: AC

## 2024-07-24 MED ORDER — TRELEGY ELLIPTA 100-62.5-25 MCG/ACT IN AEPB: 1.0000 | INHALATION_SPRAY | Freq: Every day | RESPIRATORY_TRACT | 1 refills | Status: DC

## 2024-07-24 NOTE — Telephone Encounter (Signed)
 Copied from CRM [id number]. Topic: Clinical - Medication Refill >> Jul 24, 2024  1:22 PM Macario HERO wrote: Medication: amLODipine  (NORVASC ) 5 MG tablet [[id number]] omeprazole  (PRILOSEC) 40 MG capsule [[id number]]  Has the patient contacted their pharmacy? Yes (Agent: If no, request that the patient contact the pharmacy for the refill. If patient does not wish to contact the pharmacy document the reason why and proceed with request.) (Agent: If yes, when and what did the pharmacy advise?) Pharmacy said they would call it in 4 days ago  This is the patient's preferred pharmacy:  [facility] #[id number] - [place], [place] - [address] AT [facility] [address] [place] [address] Phone: [phone] Fax: [phone]  Is this the correct pharmacy for this prescription? Yes If no, delete pharmacy and type the correct one.   Has the prescription been filled recently? No  Is the patient out of the medication? Yes  Has the patient been seen for an appointment in the last year OR does the patient have an upcoming appointment? Yes  Can we respond through MyChart? No  Agent: Please be advised that Rx refills may take up to 3 business days. We ask that you follow-up with your pharmacy.

## 2024-07-24 NOTE — Telephone Encounter (Signed)
 Copied from CRM [id number]. Topic: General - Other >> Jul 24, 2024 10:47 AM Rilla B wrote: Reason for CRM: Patient needs refill on Trelegy. Patient has upcoming appt with Dr Kara. Patient states he called for refill four days ago. He's calling in to check the status of the refill. He states he was told he could get enough of the Trelegy to take him to his appt on Sept 29 as he is out. He check in with Pharmacy and they do not have the refill order and it's been four days. Please call patient.   Spoke with patient refill sent = refill until his appointment   NFN-

## 2024-07-24 NOTE — Progress Notes (Addendum)
 Spoke with patient courtesy refill and 1 refill to get him until his appointment.     NFN-

## 2024-07-25 NOTE — Telephone Encounter (Signed)
Rx refill sent to pt pharmacy 

## 2024-07-28 ENCOUNTER — Ambulatory Visit: Payer: Self-pay | Admitting: Pulmonary Disease

## 2024-07-28 DIAGNOSIS — J411 Mucopurulent chronic bronchitis: Secondary | ICD-10-CM

## 2024-07-28 MED ORDER — ALBUTEROL SULFATE HFA 108 (90 BASE) MCG/ACT IN AERS: 2.0000 | INHALATION_SPRAY | RESPIRATORY_TRACT | 0 refills | Status: DC | PRN

## 2024-07-28 MED ORDER — TRELEGY ELLIPTA 100-62.5-25 MCG/ACT IN AEPB: 1.0000 | INHALATION_SPRAY | Freq: Every day | RESPIRATORY_TRACT | 0 refills | Status: DC

## 2024-07-28 NOTE — Telephone Encounter (Addendum)
 Left message on patient VM to call clinic  regarding patient c/o SOB.  Will send in another refill for Trelegy as pharmacy states they did not get refill that was sent in on 07/24/24.  Will also send in rescue inhaler per patients request.

## 2024-07-28 NOTE — Telephone Encounter (Signed)
 FYI Only or Action Required?: Action required by provider: Refusing appt til one scheduled in September, needs call back for further recommendations as well as refill for trelegy that pharmacy confirms they did not receive on 7/31 - Pt originally requested this refill on 7/22 - NEEDS SENT ASAP. Pt also out of rescue inhaler.  Patient is followed in Pulmonology for COPD, last seen on 03/01/2023 by Kara Dorn NOVAK, MD.  Called Nurse Triage reporting Shortness of Breath, Medication Refill, and Wheezing.  Symptoms began several days ago.  Interventions attempted: Other: attempts to refill trelegy.  Symptoms are: gradually worsening.  Triage Disposition: See HCP Within 4 Hours (Or PCP Triage)  Patient/caregiver understands and will follow disposition?: No, refuses disposition    Copied from CRM [id number]. Topic: Clinical - Red Word Triage >> Jul 28, 2024  9:40 AM Leila BROCKS wrote: Red Word that prompted transfer to Nurse Triage: Patient [phone] states Dr. Luann nurse sent Trelegy last week but [facility] pharmacy does not have the medication, patient is out of Trelegy and needs a refill. Patient states has shortness of breath all the time, wheezing and medication helps. Patient denies pain nor dizziness. Please advise.    [facility] #[id number] - [place], [place] - [address] AT [facility] [address] [address] Phone: [phone] Fax: [phone] Reason for Disposition  [1] Longstanding difficulty breathing (e.g., CHF, COPD, emphysema) AND [2] WORSE than normal  Answer Assessment - Initial Assessment Questions E2C2 Pulmonary Triage - Initial Assessment Questions Chief Complaint (e.g., cough, sob, wheezing, fever, chills, sweat or additional symptoms) *Go to specific symptom protocol after initial questions. More SOB than usual with moving around mostly Breathing through mouth, like usually do when gets bad Wheezing not all the time No fever or chills, chest pain,  dizziness, excessive sweating, weakness Not struggling to breathe Just need more oxygen  so breathing through mouth Cough stuff up all the time, like usual, beige looking my usual  How long have symptoms been present? Felt like it was different the next day after running out of trelegy  Have you used your inhalers/maintenance medication? Yes If yes, What medications? Out of rescue inhaler Trelegy but been out of it for 2 days  OXYGEN : Do you wear supplemental oxygen ? No  Do you monitor your oxygen  levels? No  6. CARDIAC HISTORY: Do you have any history of heart disease? (e.g., heart attack, angina, bypass surgery, angioplasty)      HTN  7. LUNG HISTORY: Do you have any history of lung disease?  (e.g., pulmonary embolus, asthma, emphysema)     COPD  Got appt in September, all I need is med    Advised pt be examined in next 4 hours, pt refusing since has upcoming pulm appt in September. Informed pt that trelegy was sent to pharmacy on 7/31, pt will call pharmacy to confirm and call back if issues with script. Sending message to pulm for call back with further recommendations in meantime.   Per pt chart, no pharmacy attached to refill note for trelegy refill on 7/31.  Pt called back reporting that pharmacy does not have the script. This nurse called pharmacy and confirmed they did not receive script for 7/31 trelegy. Sending message to office for expediting refill request originally made on 7/22.  Protocols used: Breathing Difficulty-A-AH

## 2024-07-30 ENCOUNTER — Telehealth: Payer: Self-pay | Admitting: Medical

## 2024-07-30 NOTE — Telephone Encounter (Signed)
 Copied from CRM #[id number]. Topic: Medicare AWV >> Jul 30, 2024  1:46 PM Nathanel DEL wrote: Reason for CRM: Called LVM 07/30/2024 to schedule AWV. Please schedule Virtual or Telehealth visits ONLY  Nathanel Paschal; Care Guide Ambulatory Clinical Support [place] [facility] Direct Dial: [phone]

## 2024-08-11 ENCOUNTER — Other Ambulatory Visit: Payer: Self-pay | Admitting: Pulmonary Disease

## 2024-08-24 ENCOUNTER — Other Ambulatory Visit: Payer: Self-pay | Admitting: Pulmonary Disease

## 2024-08-28 ENCOUNTER — Ambulatory Visit: Payer: Self-pay | Admitting: Pulmonary Disease

## 2024-08-28 ENCOUNTER — Other Ambulatory Visit (HOSPITAL_BASED_OUTPATIENT_CLINIC_OR_DEPARTMENT_OTHER): Payer: Self-pay

## 2024-08-28 MED ORDER — TRELEGY ELLIPTA 100-62.5-25 MCG/ACT IN AEPB: 1.0000 | INHALATION_SPRAY | Freq: Every day | RESPIRATORY_TRACT | 0 refills | Status: DC

## 2024-08-28 NOTE — Telephone Encounter (Signed)
 FYI Only or Action Required?: Action required by provider: medication refill request and update on patient condition.  Patient is followed in Pulmonology for COPD, last seen on 03/01/2023 by Kara Dorn NOVAK, MD.  Called Nurse Triage reporting Medication Refill.  Symptoms began today.  Interventions attempted: Nothing.  Symptoms are: unchanged.  Triage Disposition: See HCP Within 4 Hours (Or PCP Triage)  Patient/caregiver understands and will follow disposition?: No                  Copied from CRM #[id number]. Topic: Clinical - Red Word Triage >> Aug 28, 2024  8:56 AM Leila C wrote: Red Word that prompted transfer to Nurse Triage: Patient [phone] states called yesterday and is out of Fluticasone -Umeclidin-Vilant (TRELEGY ELLIPTA ) 100-62.5-25 MCG/ACT AEPB and patient needs a refill until office visit with Dr. Kara 09/22/24. Patient states breathing is bad, shortness of breath, no wheezing, if patient has his medication he will be fine. Please advise.   [id number] - [place], [place] - [address] AT [facility] [address] [place] [address] Phone: [phone] Fax: [phone] Reason for Disposition  [1] MILD difficulty breathing (e.g., minimal/no SOB at rest, SOB with walking, pulse < 100) AND [2] NEW-onset or WORSE than normal  Answer Assessment - Initial Assessment Questions Pt is requesting a refill of TRELEGY ELLIPTA  100-62.5-25 MCG/ACT AEPB be sent to:  [id number] - [place], [place] - [address] AT [facility] [address] [place]  [address] Phone: [phone] Fax: [phone]  Pt thinks this is what is causing his breathing difficulty as he has been out for one month. Pt has appt scheduled for 9/29 with pulmonologist.  This RN offered to schedule him a sooner appt with a different provider due to acute breathing issue but pt declined stating it is being out of  this medication that is causing symptoms. This RN educated pt on new-worsening symptoms and when to call back/seek emergent care. Pt call back number is [phone]   SOB with movement; pt states thinks its from being out of his TRELEGY ELLIPTA  100-62.5-25 MCG/ACT AEPB  Has been out for 1 month; pt states he called 4 days ago for this; pt has next appt on 9/29  ONSET: When did this breathing problem begin?      This morning bc I haven't been able to take my medicine  PATTERN Does the difficult breathing come and go, or has it been constant since it started?      Comes and goes  SEVERITY: How bad is your breathing? (e.g., mild, moderate, severe)      Moderate  OTHER SYMPTOMS: Do you have any other symptoms? (e.g., chest pain, cough, dizziness, fever, runny nose)     Denies besides cough from COPD  O2 SATURATION MONITOR:  Do you use an oxygen  saturation monitor (pulse oximeter) at home? If Yes, ask: What is your reading (oxygen  level) today? What is your usual oxygen  saturation reading? (e.g., 95%)       Does not have a way to check  Protocols used: Breathing Difficulty-A-AH

## 2024-08-28 NOTE — Telephone Encounter (Signed)
 FYI  RX sent to pharmacy for one month supply, spoke with pt he will proceed to ED or call us  back if he picks up his med and this does not resolve his breathing as he thinks being out of med is the reason for this happening.NFN

## 2024-08-29 NOTE — Telephone Encounter (Signed)
 Copied from CRM [id number]. Topic: Clinical - Prescription Issue >> Aug 27, 2024  3:35 PM Corean SAUNDERS wrote: Reason for CRM: Patient is requesting a phone call back from Dr. Ples nurse as he states he will be out of his medication before his appointment on 9/29 and insists on taking with a nurse because he not been seen in over a year.  ATC x1. No answer- left detailed vm (DPR) stating courtesy Trelegy inhaler refill was sent to pts pharmacy 9/4. Nfn

## 2024-09-02 ENCOUNTER — Ambulatory Visit (INDEPENDENT_AMBULATORY_CARE_PROVIDER_SITE_OTHER): Admitting: Medical

## 2024-09-02 ENCOUNTER — Encounter: Payer: Self-pay | Admitting: Medical

## 2024-09-02 ENCOUNTER — Ambulatory Visit: Payer: Self-pay | Admitting: Medical

## 2024-09-02 VITALS — BP 134/70 | HR 99 | Temp 97.9°F | Resp 15 | Ht 70.0 in | Wt 197.6 lb

## 2024-09-02 DIAGNOSIS — F419 Anxiety disorder, unspecified: Secondary | ICD-10-CM | POA: Diagnosis not present

## 2024-09-02 DIAGNOSIS — J449 Chronic obstructive pulmonary disease, unspecified: Secondary | ICD-10-CM

## 2024-09-02 DIAGNOSIS — D696 Thrombocytopenia, unspecified: Secondary | ICD-10-CM | POA: Diagnosis not present

## 2024-09-02 DIAGNOSIS — I10 Essential (primary) hypertension: Secondary | ICD-10-CM | POA: Diagnosis not present

## 2024-09-02 DIAGNOSIS — Z79899 Other long term (current) drug therapy: Secondary | ICD-10-CM

## 2024-09-02 LAB — CBC WITH DIFFERENTIAL/PLATELET
Basophils Absolute: 0.1 K/uL (ref 0.0–0.1)
Basophils Relative: 0.5 % (ref 0.0–3.0)
Eosinophils Absolute: 0.2 K/uL (ref 0.0–0.7)
Eosinophils Relative: 2 % (ref 0.0–5.0)
HCT: 47.8 % (ref 39.0–52.0)
Hemoglobin: 16 g/dL (ref 13.0–17.0)
Lymphocytes Relative: 26.8 % (ref 12.0–46.0)
Lymphs Abs: 3.1 K/uL (ref 0.7–4.0)
MCHC: 33.5 g/dL (ref 30.0–36.0)
MCV: 85.4 fl (ref 78.0–100.0)
Monocytes Absolute: 0.8 K/uL (ref 0.1–1.0)
Monocytes Relative: 6.4 % (ref 3.0–12.0)
Neutro Abs: 7.5 K/uL (ref 1.4–7.7)
Neutrophils Relative %: 64.3 % (ref 43.0–77.0)
Platelets: 175 K/uL (ref 150.0–400.0)
RBC: 5.6 Mil/uL (ref 4.22–5.81)
RDW: 14.9 % (ref 11.5–15.5)
WBC: 11.7 K/uL — ABNORMAL HIGH (ref 4.0–10.5)

## 2024-09-02 MED ORDER — CITALOPRAM HYDROBROMIDE 40 MG PO TABS: 40.0000 mg | ORAL_TABLET | Freq: Every day | ORAL | 0 refills | Status: DC

## 2024-09-02 MED ORDER — CITALOPRAM HYDROBROMIDE 40 MG PO TABS: 40.0000 mg | ORAL_TABLET | Freq: Every day | ORAL | 11 refills | Status: DC

## 2024-09-02 MED ORDER — AMLODIPINE BESYLATE 5 MG PO TABS: 5.0000 mg | ORAL_TABLET | Freq: Every day | ORAL | 3 refills | Status: AC

## 2024-09-02 MED ORDER — AMLODIPINE BESYLATE 5 MG PO TABS: 5.0000 mg | ORAL_TABLET | Freq: Every day | ORAL | 1 refills | Status: DC

## 2024-09-02 NOTE — Patient Instructions (Signed)
 Essential hypertension Off amlodipine , still on losartan . - Send amlodipine  prescription to [facility], [address] with 90 tablets with refills. Continue meds. - Check kidney function and CBC.  Chronic obstructive pulmonary disease (COPD) Follows with pulmonologist, uses nebulizer with albuterol  and Trelegy, oxygen  saturation 95%, occasional dizziness possibly from COPD or low oxygen  during activity.  Anxiety disorder and major depressive disorder Anxiety and depression managed with citalopram  and lorazepam . Off citalopram  recently, likely increasing anxiety. No self-harm thoughts. Lorazepam  dose stable. Citalopram  to be resumed to improve anxiety. - Refill citalopram  with 30 tablets and 11 refills. - Refill lorazepam  as needed. - Perform urine drug screen.  Thrombocytopenia Low platelet count. - Check CBC to monitor platelet levels.  Follow up tentative in 6 months or sooner if needed

## 2024-09-02 NOTE — Progress Notes (Signed)
 Subjective:    Patient ID: Trevor Mack, male    DOB: Sep 13, 1956, 68 y.o.   MRN: [id number]  HPI  Trevor Mack is a 68 year old male with hypertension, anxiety, and COPD who presents for a routine medication refill and follow-up.  He has encountered issues with his pharmacy regarding the cancellation of his amlodipine  prescription, which he has not taken for a couple of weeks. He was previously on amlodipine  5 mg daily and losartan  50 mg daily for hypertension.  He has a history of anxiety and is currently taking Ativan  0.5 mg twice daily and citalopram  40 mg daily. He has been off citalopram  for a couple of weeks, which he believes may be affecting his anxiety levels. No thoughts of self-harm or harm to others. He reports no adverse side effects from Ativan .  He has a history of COPD and is following up with a pulmonologist. He uses nebulizer treatments with albuterol  and is on Trelegy. He reports occasional dizziness, which he attributes to his COPD.  He has a history of low platelet levels.        Review of Systems  Constitutional:  Negative for chills, fatigue and fever.  Respiratory:  Negative for cough, chest tightness and wheezing.   Cardiovascular:  Negative for chest pain and palpitations.  Gastrointestinal:  Negative for abdominal pain, diarrhea and rectal pain.  Genitourinary:  Negative for dysuria and hematuria.  Musculoskeletal:  Negative for back pain and myalgias.  Neurological:  Negative for dizziness, weakness and numbness.  Hematological:  Negative for adenopathy.  Psychiatric/Behavioral:  Positive for dysphoric mood. Negative for behavioral problems, sleep disturbance and suicidal ideas. The patient is nervous/anxious.     Past Medical History:  Diagnosis Date   ANXIETY 07/23/2008   COLONIC POLYPS, HX OF 03/26/2006   MULTIPLE FRAGMENTS OF TUBULAR ADENOMAS POLYPS   COPD (chronic obstructive pulmonary disease) (HCC)    Diverticulosis    Family history of  colon cancer    Father    GERD 07/23/2008   Hepatitis C    HYPERLIPIDEMIA 07/23/2008   HYPERTENSION 07/23/2008   TENDINITIS, LEFT THUMB 07/15/2010     Social History   Socioeconomic History   Marital status: Widowed    Spouse name: Not on file   Number of children: 0   Years of education: Not on file   Highest education level: Not on file  Occupational History    Employer: [employer]  Tobacco Use   Smoking status: Every Day    Current packs/day: 1.50    Average packs/day: 1.5 packs/day for 49.0 years (73.5 ttl pk-yrs)    Types: Cigarettes   Smokeless tobacco: Never  Vaping Use   Vaping status: Never Used  Substance and Sexual Activity   Alcohol use: No   Drug use: No   Sexual activity: Not on file  Other Topics Concern   Not on file  Social History Narrative   Not on file   Social Drivers of Health   Financial Resource Strain: Low Risk  (08/24/2021)   Overall Financial Resource Strain (CARDIA)    Difficulty of Paying Living Expenses: Not hard at all  Food Insecurity: No Food Insecurity (08/24/2021)   Hunger Vital Sign    Worried About Running Out of Food in the Last Year: Never true    Ran Out of Food in the Last Year: Never true  Transportation Needs: No Transportation Needs (08/24/2021)   PRAPARE - Transportation    Lack of  Transportation (Medical): No    Lack of Transportation (Non-Medical): No  Physical Activity: Inactive (08/24/2021)   Exercise Vital Sign    Days of Exercise per Week: 0 days    Minutes of Exercise per Session: 0 min  Stress: No Stress Concern Present (08/24/2021)   [employer] of Occupational Health - Occupational Stress Questionnaire    Feeling of Stress : Not at all  Social Connections: Socially Isolated (08/24/2021)   Social Connection and Isolation Panel    Frequency of Communication with Friends and Family: More than three times a week    Frequency of Social Gatherings with Friends and Family: More than three times a week     Attends Religious Services: Never    Database administrator or Organizations: No    Attends Banker Meetings: Never    Marital Status: Widowed  Intimate Partner Violence: Not At Risk (08/24/2021)   Humiliation, Afraid, Rape, and Kick questionnaire    Fear of Current or Ex-Partner: No    Emotionally Abused: No    Physically Abused: No    Sexually Abused: No    Past Surgical History:  Procedure Laterality Date   COLONOSCOPY  11/27/2012   LUMBAR LAMINECTOMY      Family History  Problem Relation Age of Onset   Colon cancer Father 36   Hypertension Father    Diabetes Father    Heart attack Father    Colon polyps Neg Hx    Esophageal cancer Neg Hx    Rectal cancer Neg Hx    Stomach cancer Neg Hx     No Known Allergies  Current Outpatient Medications on File Prior to Visit  Medication Sig Dispense Refill   albuterol  (PROVENTIL ) (2.5 MG/3ML) 0.083% nebulizer solution Take 3 mLs (2.5 mg total) by nebulization every 6 (six) hours as needed for wheezing or shortness of breath. Dx code: J44.9 75 mL 5   albuterol  (VENTOLIN  HFA) 108 (90 Base) MCG/ACT inhaler INHALE 2 PUFFS INTO THE LUNGS EVERY 4 HOURS AS NEEDED FOR WHEEZING OR SHORTNESS OF BREATH 6.7 g 1   atorvastatin  (LIPITOR) 40 MG tablet TAKE 1 TABLET(40 MG) BY MOUTH DAILY 90 tablet 0   azithromycin  (ZITHROMAX ) 250 MG tablet Take as directed 6 tablet 0   benzonatate  (TESSALON ) 100 MG capsule Take 1 capsule (100 mg total) by mouth 3 (three) times daily as needed for cough. 30 capsule 0   Fluticasone -Umeclidin-Vilant (TRELEGY ELLIPTA ) 100-62.5-25 MCG/ACT AEPB Inhale 1 puff into the lungs daily. 28 each 0   ibuprofen (ADVIL) 200 MG tablet Take 400 mg by mouth every 6 (six) hours as needed.     ketoconazole  (NIZORAL ) 2 % cream Apply 1 application topically daily. 15 g 0   LORazepam  (ATIVAN ) 0.5 MG tablet TAKE 1 TABLET(0.5 MG) BY MOUTH TWICE DAILY 60 tablet 1   losartan  (COZAAR ) 50 MG tablet TAKE 1 TABLET(50 MG) BY MOUTH  DAILY 90 tablet 1   omeprazole  (PRILOSEC) 40 MG capsule Take 1 capsule (40 mg total) by mouth daily. 90 capsule 3   potassium chloride  (KLOR-CON  M) 10 MEQ tablet Take 1 tablet (10 mEq total) by mouth daily. 7 tablet 0   tamsulosin  (FLOMAX ) 0.4 MG CAPS capsule Take 1 capsule (0.4 mg total) by mouth daily. 90 capsule 3   TRELEGY ELLIPTA  100-62.5-25 MCG/ACT AEPB INHALE 1 PUFF INTO THE LUNGS DAILY 60 each 0   No current facility-administered medications on file prior to visit.    BP 134/70   Pulse 99  Temp 97.9 F (36.6 C) (Oral)   Resp 15   Ht 5' 10 (1.778 m)   Wt 197 lb 9.6 oz (89.6 kg)   SpO2 95%   BMI 28.35 kg/m      Objective:   Physical Exam  General Mental Status- Alert. General Appearance- Not in acute distress.   Skin General: Color- Normal Color. Moisture- Normal Moisture.  Neck Carotid Arteries- Normal color. Moisture- Normal Moisture. No carotid bruits. No JVD.  Chest and Lung Exam Auscultation: Breath Sounds:-CTA but shallow bilaterally.  Cardiovascular Auscultation:Rythm- RRR Murmurs & Other Heart Sounds:Auscultation of the heart reveals- No Murmurs.  Abdomen Inspection:-Inspeection Normal. Palpation/Percussion:Note:No mass. Palpation and Percussion of the abdomen reveal- Non Tender, Non Distended + BS, no rebound or guarding.   Neurologic Cranial Nerve exam:- CN III-XII intact(No nystagmus), symmetric smile. Strength:- 5/5 equal and symmetric strength both upper and lower extremities.        Assessment & Plan:   Patient Instructions  Essential hypertension Off amlodipine , still on losartan . - Send amlodipine  prescription to [facility] with 90 tablets with refills. Continue meds. - Check kidney function and CBC.  Chronic obstructive pulmonary disease (COPD) Follows with pulmonologist, uses nebulizer with albuterol  and Trelegy, oxygen  saturation 95%, occasional dizziness possibly from COPD or low oxygen  during activity.  Anxiety  disorder and major depressive disorder Anxiety and depression managed with citalopram  and lorazepam . Off citalopram  recently, likely increasing anxiety. No self-harm thoughts. Lorazepam  dose stable. Citalopram  to be resumed to improve anxiety. - Refill citalopram  with 30 tablets and 11 refills. - Refill lorazepam  as needed. - Perform urine drug screen.  Thrombocytopenia Low platelet count. - Check CBC to monitor platelet levels.  Follow up tentative in 6 months or sooner if needed   Alita Waldren, PA-C

## 2024-09-03 LAB — COMPLETE METABOLIC PANEL WITHOUT GFR
AG Ratio: 1.7 (calc) (ref 1.0–2.5)
ALT: 18 U/L (ref 9–46)
AST: 14 U/L (ref 10–35)
Albumin: 4.3 g/dL (ref 3.6–5.1)
Alkaline phosphatase (APISO): 107 U/L (ref 35–144)
BUN: 14 mg/dL (ref 7–25)
CO2: 28 mmol/L (ref 20–32)
Calcium: 8.9 mg/dL (ref 8.6–10.3)
Chloride: 103 mmol/L (ref 98–110)
Creat: 1.06 mg/dL (ref 0.70–1.35)
Globulin: 2.6 g/dL (ref 1.9–3.7)
Glucose, Bld: 103 mg/dL — ABNORMAL HIGH (ref 65–99)
Potassium: 3.8 mmol/L (ref 3.5–5.3)
Sodium: 141 mmol/L (ref 135–146)
Total Bilirubin: 0.5 mg/dL (ref 0.2–1.2)
Total Protein: 6.9 g/dL (ref 6.1–8.1)

## 2024-09-03 NOTE — Progress Notes (Signed)
 Called pt again today with no answer

## 2024-09-04 LAB — DRUG MONITORING PANEL 376104, URINE
Alphahydroxyalprazolam: NEGATIVE ng/mL (ref <25)
Alphahydroxymidazolam: NEGATIVE ng/mL (ref <50)
Alphahydroxytriazolam: NEGATIVE ng/mL (ref <50)
Aminoclonazepam: NEGATIVE ng/mL (ref <25)
Amphetamines: NEGATIVE ng/mL (ref <500)
Barbiturates: NEGATIVE ng/mL (ref <300)
Benzodiazepines: POSITIVE ng/mL — AB (ref <100)
Cocaine Metabolite: NEGATIVE ng/mL (ref <150)
Desmethyltramadol: NEGATIVE ng/mL (ref <100)
Hydroxyethylflurazepam: NEGATIVE ng/mL (ref <50)
Lorazepam: 574 ng/mL — ABNORMAL HIGH (ref <50)
Nordiazepam: NEGATIVE ng/mL (ref <50)
Opiates: NEGATIVE ng/mL (ref <100)
Oxazepam: NEGATIVE ng/mL (ref <50)
Oxycodone: NEGATIVE ng/mL (ref <100)
Temazepam: NEGATIVE ng/mL (ref <50)
Tramadol: NEGATIVE ng/mL (ref <100)

## 2024-09-04 LAB — DM TEMPLATE

## 2024-09-04 NOTE — Progress Notes (Signed)
 Patient called again x2  Letter mailed

## 2024-09-13 ENCOUNTER — Other Ambulatory Visit: Payer: Self-pay | Admitting: Medical

## 2024-09-22 ENCOUNTER — Encounter: Payer: Self-pay | Admitting: Pulmonary Disease

## 2024-09-22 ENCOUNTER — Telehealth: Payer: Self-pay

## 2024-09-22 ENCOUNTER — Ambulatory Visit: Admitting: Pulmonary Disease

## 2024-09-22 VITALS — BP 115/75 | HR 129 | Ht 70.0 in | Wt 194.0 lb

## 2024-09-22 DIAGNOSIS — F17210 Nicotine dependence, cigarettes, uncomplicated: Secondary | ICD-10-CM

## 2024-09-22 DIAGNOSIS — J449 Chronic obstructive pulmonary disease, unspecified: Secondary | ICD-10-CM | POA: Diagnosis not present

## 2024-09-22 MED ORDER — ALBUTEROL SULFATE HFA 108 (90 BASE) MCG/ACT IN AERS: 2.0000 | INHALATION_SPRAY | RESPIRATORY_TRACT | 11 refills | Status: AC | PRN

## 2024-09-22 MED ORDER — ALBUTEROL SULFATE (2.5 MG/3ML) 0.083% IN NEBU: 2.5000 mg | INHALATION_SOLUTION | Freq: Four times a day (QID) | RESPIRATORY_TRACT | 11 refills | Status: AC | PRN

## 2024-09-22 MED ORDER — TRELEGY ELLIPTA 200-62.5-25 MCG/ACT IN AEPB: 1.0000 | INHALATION_SPRAY | Freq: Every day | RESPIRATORY_TRACT | 11 refills | Status: AC

## 2024-09-22 NOTE — Telephone Encounter (Signed)
 [place] paperwork handed into pharmacy

## 2024-09-22 NOTE — Patient Instructions (Addendum)
 Continue trelegy ellipta  1 puff daily - rinse mouth out after each use  Use albuterol  inhaler 1-2 puffs every 4-6 hours as needed or nebulizer treatment  Start [place]  nebulizer treatments twice daily - complete paper work today  Continue lung cancer screening in March 2026  Over night oxygen  test on room air ordered  Follow up in 6 months, call sooner if needed

## 2024-09-22 NOTE — Progress Notes (Unsigned)
 Synopsis: Follow up for COPD  Subjective:   PATIENT ID: Trevor Mack GENDER: male DOB: 08/19/1956, MRN: [id number]  HPI  Chief Complaint  Patient presents with   Medical Management of Chronic Issues    Pt states refills    Trevor Mack is a 68 year old male, daily smoker with history of COPD who returns to pulmonary clinic for follow up of COPD.  He experiences significant shortness of breath, especially during physical activities such as walking from the parking lot to the clinic. He smokes one to one and a half packs of cigarettes daily. He uses Trelegy once daily and albuterol  as needed, but he has been out of albuterol  for some time. He is not using oxygen  at home and has not been monitoring his oxygen  levels, which are adequate at rest but decrease with activity.  He has a cough producing beige mucus without hemoptysis. He is taking omeprazole  for reflux. He is not currently using nebulizer treatments due to a lack of medication.  OV 03/01/23 He has increased shortness of breath over the past week with increase sputum production. He has ran out of trelegy ellipta  2 days ago. No fevers chills or sweats. He continues to smoke 1-1.5 packs per day.  OV 08/22/22 He has been doing ok since last visit. Reports his breathing has been stable. He is smoking 1.5 packs per day.  OV 02/22/22 His breathing is doing ok. He remains on trelegy ellipta  100, 1 puff daily and is using albuterol  4-6 times per day. He continues to smoke nearly 2 packs per day. He did not tolerate azithromycin  therapy as it upset his stomach.  PFTs show moderately severe obstruction with significant bronchodilator response, normal TLC and moderate diffusion defect.   He has completed an overnight oximetry test at [facility] but we do not have the report yet.   Past Medical History:  Diagnosis Date   ANXIETY 07/23/2008   COLONIC POLYPS, HX OF 03/26/2006   MULTIPLE FRAGMENTS OF TUBULAR ADENOMAS POLYPS   COPD (chronic  obstructive pulmonary disease) (HCC)    Diverticulosis    Family history of colon cancer    Father    GERD 07/23/2008   Hepatitis C    HYPERLIPIDEMIA 07/23/2008   HYPERTENSION 07/23/2008   TENDINITIS, LEFT THUMB 07/15/2010     Family History  Problem Relation Age of Onset   Colon cancer Father 38   Hypertension Father    Diabetes Father    Heart attack Father    Colon polyps Neg Hx    Esophageal cancer Neg Hx    Rectal cancer Neg Hx    Stomach cancer Neg Hx      Social History   Socioeconomic History   Marital status: Widowed    Spouse name: Not on file   Number of children: 0   Years of education: Not on file   Highest education level: Not on file  Occupational History    Employer: [employer]  Tobacco Use   Smoking status: Every Day    Current packs/day: 1.50    Average packs/day: 1.5 packs/day for 49.0 years (73.5 ttl pk-yrs)    Types: Cigarettes   Smokeless tobacco: Never  Vaping Use   Vaping status: Never Used  Substance and Sexual Activity   Alcohol use: No   Drug use: No   Sexual activity: Not on file  Other Topics Concern   Not on file  Social History Narrative   Not on file  Social Drivers of Corporate investment banker Strain: Low Risk  (08/24/2021)   Overall Financial Resource Strain (CARDIA)    Difficulty of Paying Living Expenses: Not hard at all  Food Insecurity: No Food Insecurity (08/24/2021)   Hunger Vital Sign    Worried About Running Out of Food in the Last Year: Never true    Ran Out of Food in the Last Year: Never true  Transportation Needs: No Transportation Needs (08/24/2021)   PRAPARE - Administrator, Civil Service (Medical): No    Lack of Transportation (Non-Medical): No  Physical Activity: Inactive (08/24/2021)   Exercise Vital Sign    Days of Exercise per Week: 0 days    Minutes of Exercise per Session: 0 min  Stress: No Stress Concern Present (08/24/2021)   [employer] of Occupational Health -  Occupational Stress Questionnaire    Feeling of Stress : Not at all  Social Connections: Socially Isolated (08/24/2021)   Social Connection and Isolation Panel    Frequency of Communication with Friends and Family: More than three times a week    Frequency of Social Gatherings with Friends and Family: More than three times a week    Attends Religious Services: Never    Database administrator or Organizations: No    Attends Banker Meetings: Never    Marital Status: Widowed  Intimate Partner Violence: Not At Risk (08/24/2021)   Humiliation, Afraid, Rape, and Kick questionnaire    Fear of Current or Ex-Partner: No    Emotionally Abused: No    Physically Abused: No    Sexually Abused: No     No Known Allergies   Outpatient Medications Prior to Visit  Medication Sig Dispense Refill   amLODipine  (NORVASC ) 5 MG tablet Take 1 tablet (5 mg total) by mouth daily. 90 tablet 3   atorvastatin  (LIPITOR) 40 MG tablet TAKE 1 TABLET BY MOUTH DAILY. 90 tablet 0   citalopram  (CELEXA ) 40 MG tablet Take 1 tablet (40 mg total) by mouth daily. 30 tablet 11   ibuprofen (ADVIL) 200 MG tablet Take 400 mg by mouth every 6 (six) hours as needed.     losartan  (COZAAR ) 50 MG tablet TAKE 1 TABLET(50 MG) BY MOUTH DAILY 90 tablet 1   omeprazole  (PRILOSEC) 40 MG capsule Take 1 capsule (40 mg total) by mouth daily. 90 capsule 3   potassium chloride  (KLOR-CON  M) 10 MEQ tablet Take 1 tablet (10 mEq total) by mouth daily. 7 tablet 0   tamsulosin  (FLOMAX ) 0.4 MG CAPS capsule Take 1 capsule (0.4 mg total) by mouth daily. 90 capsule 3   albuterol  (PROVENTIL ) (2.5 MG/3ML) 0.083% nebulizer solution Take 3 mLs (2.5 mg total) by nebulization every 6 (six) hours as needed for wheezing or shortness of breath. Dx code: J44.9 75 mL 5   albuterol  (VENTOLIN  HFA) 108 (90 Base) MCG/ACT inhaler INHALE 2 PUFFS INTO THE LUNGS EVERY 4 HOURS AS NEEDED FOR WHEEZING OR SHORTNESS OF BREATH 6.7 g 1   azithromycin  (ZITHROMAX ) 250 MG  tablet Take as directed 6 tablet 0   benzonatate  (TESSALON ) 100 MG capsule Take 1 capsule (100 mg total) by mouth 3 (three) times daily as needed for cough. 30 capsule 0   Fluticasone -Umeclidin-Vilant (TRELEGY ELLIPTA ) 100-62.5-25 MCG/ACT AEPB Inhale 1 puff into the lungs daily. 28 each 0   ketoconazole  (NIZORAL ) 2 % cream Apply 1 application topically daily. 15 g 0   LORazepam  (ATIVAN ) 0.5 MG tablet TAKE 1 TABLET(0.5 MG) BY  MOUTH TWICE DAILY 60 tablet 1   TRELEGY ELLIPTA  100-62.5-25 MCG/ACT AEPB INHALE 1 PUFF INTO THE LUNGS DAILY 60 each 0   No facility-administered medications prior to visit.    Review of Systems  Constitutional:  Negative for chills, fever and weight loss.  HENT:  Negative for congestion, sinus pain and sore throat.   Eyes: Negative.   Respiratory:  Positive for cough, sputum production, shortness of breath and wheezing. Negative for hemoptysis.   Cardiovascular:  Negative for chest pain and leg swelling.  Gastrointestinal:  Negative for abdominal pain, heartburn and nausea.  Genitourinary: Negative.   Musculoskeletal: Negative.   Neurological:  Negative for dizziness, weakness and headaches.  Endo/Heme/Allergies: Negative.   Psychiatric/Behavioral: Negative.     Objective:   Vitals:   09/22/24 1428  BP: 115/75  Pulse: (!) 129  SpO2: 92%  Weight: 194 lb (88 kg)  Height: 5' 10 (1.778 m)    Physical Exam Constitutional:      General: He is not in acute distress.    Appearance: He is obese. He is not ill-appearing.  HENT:     Head: Normocephalic and atraumatic.  Cardiovascular:     Pulses: Normal pulses.     Heart sounds: Normal heart sounds. No murmur heard. Pulmonary:     Effort: No accessory muscle usage or prolonged expiration.     Breath sounds: Decreased air movement present. Decreased breath sounds, wheezing and rhonchi present. No rales.  Musculoskeletal:     Right lower leg: No edema.     Left lower leg: No edema.  Skin:    Capillary  Refill: Capillary refill takes less than 2 seconds.  Neurological:     General: No focal deficit present.     Mental Status: He is alert.     Gait: Gait normal.  Psychiatric:        Mood and Affect: Mood normal.        Behavior: Behavior normal.        Thought Content: Thought content normal.        Judgment: Judgment normal.    CBC    Component Value Date/Time   WBC 11.7 (H) 09/02/2024 0944   RBC 5.60 09/02/2024 0944   HGB 16.0 09/02/2024 0944   HCT 47.8 09/02/2024 0944   PLT 175.0 09/02/2024 0944   MCV 85.4 09/02/2024 0944   MCHC 33.5 09/02/2024 0944   RDW 14.9 09/02/2024 0944   LYMPHSABS 3.1 09/02/2024 0944   MONOABS 0.8 09/02/2024 0944   EOSABS 0.2 09/02/2024 0944   BASOSABS 0.1 09/02/2024 0944      Latest Ref Rng & Units 09/02/2024    9:44 AM 03/31/2024    9:12 AM 03/05/2023    8:37 AM  BMP  Glucose 65 - 99 mg/dL 896  84  96   BUN 7 - 25 mg/dL 14  17  14    Creatinine 0.70 - 1.35 mg/dL 8.93  8.86  8.86   BUN/Creat Ratio 6 - 22 (calc) SEE NOTE:     Sodium 135 - 146 mmol/L 141  143  140   Potassium 3.5 - 5.3 mmol/L 3.8  3.8  3.1   Chloride 98 - 110 mmol/L 103  105  101   CO2 20 - 32 mmol/L 28  27  29    Calcium  8.6 - 10.3 mg/dL 8.9  8.8  8.9    Chest imaging: CT Chest 01/19/2021 Mediastinum/Nodes: No mediastinal lymphadenopathy. No evidence for gross hilar lymphadenopathy although assessment is limited  by the lack of intravenous contrast on today's study. Tiny hiatal hernia. The esophagus has normal imaging features. There is no axillary lymphadenopathy.   Lungs/Pleura: Centrilobular emphsyema noted. Scattered solid and non solid bilateral pulmonary nodules are identified. No overtly suspicious nodule or mass. No focal airspace consolidation. No pleural effusion.  CXR 10/2018 There is no edema or consolidation. The heart size and pulmonary vascularity are normal. No adenopathy. No evident bone lesions.  PFT:    Latest Ref Rng & Units 08/05/2021    8:30 AM  04/12/2017    2:47 PM  PFT Results  FVC-Pre L 2.88  2.43   FVC-Predicted Pre % 64  52   FVC-Post L 3.12  2.85   FVC-Predicted Post % 69  61   Pre FEV1/FVC % % 55  62   Post FEV1/FCV % % 56  61   FEV1-Pre L 1.57  1.49   FEV1-Predicted Pre % 46  42   FEV1-Post L 1.75  1.75   DLCO uncorrected ml/min/mmHg 11.54  12.29   DLCO UNC% % 44  39   DLCO corrected ml/min/mmHg 11.54  11.82   DLCO COR %Predicted % 44  38   DLVA Predicted % 59  61   TLC L 6.84  7.02   TLC % Predicted % 100  103   RV % Predicted % 155  214     PFT 2022: Moderate severe obstruction, significant bronchodilator response  Assessment & Plan:   Chronic obstructive pulmonary disease, unspecified COPD type (HCC) - Plan: Fluticasone -Umeclidin-Vilant (TRELEGY ELLIPTA ) 200-62.5-25 MCG/ACT AEPB, albuterol  (VENTOLIN  HFA) 108 (90 Base) MCG/ACT inhaler, albuterol  (PROVENTIL ) (2.5 MG/3ML) 0.083% nebulizer solution, Pulse oximetry, overnight  Discussion: Trevor Mack is a 68 year old male, daily smoker with history of COPD who returns to pulmonary clinic for follow up.   Chronic obstructive pulmonary disease (COPD) COPD with worsening dyspnea on exertion, increased shortness of breath, and beige mucus production. Wheezing noted on the right side. Discussed potential addition of Otuver nebulizer medication. - Increase Trelegy to 200 mcg daily. - Refill albuterol  inhaler. - Refill nebulizer medication. - Prescribe updated nebulizer mouthpiece and hosing. - Discuss and initiate paperwork for Otuver nebulizer medication. - Conduct a walking test to assess oxygen  saturation. - Order overnight oxygen  test to evaluate need for nocturnal oxygen  therapy.  Tobacco use disorder Continues to smoke 1 to 1.5 packs per day with no interest in cessation.  Follow up in 6 months  Dorn Chill, MD [facility] Office: [phone]     Current Outpatient Medications:    amLODipine  (NORVASC ) 5 MG tablet, Take 1  tablet (5 mg total) by mouth daily., Disp: 90 tablet, Rfl: 3   atorvastatin  (LIPITOR) 40 MG tablet, TAKE 1 TABLET BY MOUTH DAILY., Disp: 90 tablet, Rfl: 0   citalopram  (CELEXA ) 40 MG tablet, Take 1 tablet (40 mg total) by mouth daily., Disp: 30 tablet, Rfl: 11   Fluticasone -Umeclidin-Vilant (TRELEGY ELLIPTA ) 200-62.5-25 MCG/ACT AEPB, Inhale 1 puff into the lungs daily., Disp: 28 each, Rfl: 11   ibuprofen (ADVIL) 200 MG tablet, Take 400 mg by mouth every 6 (six) hours as needed., Disp: , Rfl:    losartan  (COZAAR ) 50 MG tablet, TAKE 1 TABLET(50 MG) BY MOUTH DAILY, Disp: 90 tablet, Rfl: 1   omeprazole  (PRILOSEC) 40 MG capsule, Take 1 capsule (40 mg total) by mouth daily., Disp: 90 capsule, Rfl: 3   potassium chloride  (KLOR-CON  M) 10 MEQ tablet, Take 1 tablet (10 mEq total) by  mouth daily., Disp: 7 tablet, Rfl: 0   tamsulosin  (FLOMAX ) 0.4 MG CAPS capsule, Take 1 capsule (0.4 mg total) by mouth daily., Disp: 90 capsule, Rfl: 3   albuterol  (PROVENTIL ) (2.5 MG/3ML) 0.083% nebulizer solution, Take 3 mLs (2.5 mg total) by nebulization every 6 (six) hours as needed for wheezing or shortness of breath. Dx code: J44.9, Disp: 120 mL, Rfl: 11   albuterol  (VENTOLIN  HFA) 108 (90 Base) MCG/ACT inhaler, Inhale 2 puffs into the lungs every 4 (four) hours as needed for wheezing or shortness of breath., Disp: 18 g, Rfl: 11   LORazepam  (ATIVAN ) 0.5 MG tablet, TAKE 1 TABLET(0.5 MG) BY MOUTH TWICE DAILY, Disp: 60 tablet, Rfl: 0

## 2024-09-25 ENCOUNTER — Other Ambulatory Visit (HOSPITAL_BASED_OUTPATIENT_CLINIC_OR_DEPARTMENT_OTHER): Payer: Self-pay | Admitting: Pulmonary Disease

## 2024-09-25 ENCOUNTER — Other Ambulatory Visit: Payer: Self-pay | Admitting: Medical

## 2024-09-25 ENCOUNTER — Telehealth: Payer: Self-pay

## 2024-09-25 NOTE — Telephone Encounter (Signed)
 Called pt and advised him that we are just waiting on approval And it may take up to three day but reassured him that we are working on it   Copied from [employer] #[id number]. Topic: Clinical - Prescription Issue >> Sep 25, 2024 10:51 AM Turkey A wrote: Reason for CRM: Patient called said that Pharmacy said that the LORazepam  (ATIVAN ) 0.5 MG tablet has been denied. Please contact patient agent informed patient that refill is being worked on.

## 2024-09-25 NOTE — Telephone Encounter (Signed)
 Requesting: lorazepam  0.5mg   Contract: 09/02/24 UDS: 09/02/24 Last Visit: 09/02/24 Next Visit: None Last Refill: 07/25/24 #60 and 1RF   Please Advise

## 2024-09-26 ENCOUNTER — Telehealth: Payer: Self-pay

## 2024-09-26 ENCOUNTER — Encounter: Payer: Self-pay | Admitting: Pulmonary Disease

## 2024-09-26 NOTE — Telephone Encounter (Signed)
 Received Ohtuvayre  new start paperwork. Completed form and faxed with clinicals and insurance card copy to [facility]   Phone#: [phone] Fax#: [phone]

## 2024-09-26 NOTE — Telephone Encounter (Signed)
 Received fax from [employer] Confirming receipt of Ohtuvayre  enrollment form  Patient ID: [id number]

## 2024-09-30 NOTE — Telephone Encounter (Signed)
 Received fax from [employer] with summary of benefits. Referral form for Ohtuvayre  received. Rx will be triaged to [facility].. Once benefits investigation completed, pharmacy will reach out the patient to schedule shipment. If medication is unaffordable, patient will need to express financial hardship to be referred back to [place] [facility] for patient assistance program pre-screening.   Patient ID: [id number] Pharmacy phone: [phone] [place] [facility] Phone#: [phone]

## 2024-09-30 NOTE — Telephone Encounter (Signed)
 Received fax from [facility] confirming receipt of rx and they will schedule shipment shortly.

## 2024-10-01 NOTE — Telephone Encounter (Signed)
 Received notification from [facility] regarding a prior authorization for OHTUVAYRE . Authorization has been APPROVED from 10/01/24 to 10/01/25. Approval letter sent to scan center.  Authorization # [id number]  Sherry Pennant, PharmD, MPH, BCPS, CPP Clinical Pharmacist [facility])

## 2024-10-01 NOTE — Telephone Encounter (Signed)
 Submitted a Prior Authorization request to [facility] for OHTUVAYRE  via CoverMyMeds. Will update once we receive a response.  Key: [id number]

## 2024-10-08 NOTE — Telephone Encounter (Signed)
 Submitted a Prior Authorization request to [facility] [employer] for OHTUVAYRE  via CoverMyMeds. Will update once we receive a response.  Key: [id number]

## 2024-10-08 NOTE — Telephone Encounter (Signed)
 Received notification from [employer] regarding a prior authorization for OHTUVAYRE . Authorization has been APPROVED from 10/08/24 to 12/24/24. Approval letter sent to scan center.  Authorization # [id number]  Sherry Pennant, PharmD, MPH, BCPS, CPP Clinical Pharmacist [facility])

## 2024-10-20 ENCOUNTER — Other Ambulatory Visit: Payer: Self-pay | Admitting: Medical

## 2024-10-24 ENCOUNTER — Other Ambulatory Visit: Payer: Self-pay | Admitting: Family

## 2024-10-29 ENCOUNTER — Telehealth: Payer: Self-pay

## 2024-10-29 MED ORDER — LORAZEPAM 0.5 MG PO TABS: 0.5000 mg | ORAL_TABLET | Freq: Two times a day (BID) | ORAL | 0 refills | Status: DC

## 2024-10-29 NOTE — Telephone Encounter (Signed)
 Requesting: lorazepam    Contract: Yes 09/11/24 UDS: 09/02/24 Last Visit: Next Visit: Visit date not found Last Refill: 09/25/24  Please Advise called pt think he is talking about celexa  says its a medication to calm his nerves    Copied from CRM #[id number]. Topic: Clinical - Prescription Issue >> Oct 29, 2024  1:10 PM Savanna F wrote: Reason for CRM: Patient states that he put in a refill request for his LORazepam  (ATIVAN ) 0.5 MG tablet and another medication which he was unsure the name of, but has not yet heard back and wanted to check the status of his refill.  Looks like the request for the LORazepam  (ATIVAN ) 0.5 MG tablet  Is still pending PCP signature and I am not sure the name of the other medication he requested. I attempted to go through the names with him but he was unsure and states he threw the bottle away so he wasn't positive.  Can we please call the patient and ensure we are filling the correct medication for him, please?  Patient callback is [phone]

## 2024-10-29 NOTE — Telephone Encounter (Signed)
 Rx refill ativan . Call pt and advise can use celexa  as well to help control anxiety.

## 2024-10-29 NOTE — Addendum Note (Signed)
 Addended by: DORINA DALLAS HERO on: 10/29/2024 06:43 PM   Modules accepted: Orders

## 2024-10-30 NOTE — Telephone Encounter (Signed)
 Called pt and left vm per pcp that both rxs are filled and can be taken together to control anxiety

## 2024-11-24 ENCOUNTER — Other Ambulatory Visit: Payer: Self-pay | Admitting: Medical

## 2024-11-24 NOTE — Telephone Encounter (Signed)
 Rx refill sent to pharmacy.

## 2024-11-24 NOTE — Telephone Encounter (Signed)
 Requesting: lorazepam  0.5mg  Contract: 09/02/24 UDS: 09/02/24 Last Visit: 09/02/24 Next Visit: None Last Refill: 10/29/24 #60 and 0RF   Please Advise

## 2024-11-30 ENCOUNTER — Other Ambulatory Visit: Payer: Self-pay | Admitting: Medical

## 2024-12-11 ENCOUNTER — Other Ambulatory Visit: Payer: Self-pay | Admitting: Medical

## 2025-01-08 ENCOUNTER — Ambulatory Visit: Payer: Self-pay

## 2025-01-08 NOTE — Telephone Encounter (Signed)
 FYI Only or Action Required?: Action required by provider: appt for tomorrow - pt states he cannot come in today d/t stomach issues.  Patient was last seen in primary care on 09/02/2024 by Dorina Loving, PA-C.  Called Nurse Triage reporting Eye Pain. - swelling  at inner corner, watering, eye is red  Symptoms began several days ago.  Interventions attempted: Nothing.  Symptoms are: gradually worsening.  Triage Disposition: See Physician Within 24 Hours  Patient/caregiver understands and will follow disposition?: Yes                  Copied from CRM #[id number]. Topic: Clinical - Red Word Triage >> Jan 08, 2025  8:25 AM Robinson DEL wrote: Red Word that prompted transfer to Nurse Triage: Bump on eye beside nose, eye is bloodshot red and eye is closing, in pain can't touch it Reason for Disposition  Eye pain present > 24 hours  Answer Assessment - Initial Assessment Questions 1. ONSET: When did the pain start? (e.g., minutes, hours, days)     A couple of days 2. TIMING: Does the pain come and go, or has it been constant since it started? (e.g., constant, intermittent, fleeting)     constant 3. SEVERITY: How bad is the pain?  (Scale 1-10; mild, moderate or severe)     moderate 4. LOCATION: Where does it hurt?  (e.g., eyelid, eye, cheekbone)     Pea sized lump on inner eye by nose 5. CAUSE: What do you think is causing the pain?     unsure 6. VISION: Do you have blurred vision or changes in your vision?      Eye is closing -  7. EYE DISCHARGE: Is there any discharge (pus) from the eye(s)?  If Yes, ask: What color is it?      watering 8. FEVER: Do you have a fever? If Yes, ask: What is it, how was it measured, and when did it start?      no 9. OTHER SYMPTOMS: Do you have any other symptoms? (e.g., headache, nasal discharge, facial rash)     Eye is red  Protocols used: Eye Pain and Other Symptoms-A-AH

## 2025-01-09 ENCOUNTER — Ambulatory Visit: Admitting: Medical

## 2025-01-09 ENCOUNTER — Encounter: Payer: Self-pay | Admitting: Medical

## 2025-01-09 ENCOUNTER — Ambulatory Visit: Payer: Self-pay | Admitting: Medical

## 2025-01-09 VITALS — BP 112/80 | HR 84 | Temp 97.8°F | Resp 16 | Ht 70.0 in | Wt 193.6 lb

## 2025-01-09 DIAGNOSIS — L03213 Periorbital cellulitis: Secondary | ICD-10-CM | POA: Diagnosis not present

## 2025-01-09 DIAGNOSIS — L089 Local infection of the skin and subcutaneous tissue, unspecified: Secondary | ICD-10-CM | POA: Diagnosis not present

## 2025-01-09 LAB — COMPREHENSIVE METABOLIC PANEL WITH GFR
ALT: 16 U/L (ref 3–53)
AST: 10 U/L (ref 5–37)
Albumin: 4.3 g/dL (ref 3.5–5.2)
Alkaline Phosphatase: 136 U/L — ABNORMAL HIGH (ref 39–117)
BUN: 14 mg/dL (ref 6–23)
CO2: 31 meq/L (ref 19–32)
Calcium: 8.5 mg/dL (ref 8.4–10.5)
Chloride: 104 meq/L (ref 96–112)
Creatinine, Ser: 1.1 mg/dL (ref 0.40–1.50)
GFR: 69.36 mL/min
Glucose, Bld: 82 mg/dL (ref 70–99)
Potassium: 3.8 meq/L (ref 3.5–5.1)
Sodium: 142 meq/L (ref 135–145)
Total Bilirubin: 0.5 mg/dL (ref 0.2–1.2)
Total Protein: 6.8 g/dL (ref 6.0–8.3)

## 2025-01-09 LAB — CBC WITH DIFFERENTIAL/PLATELET
Basophils Absolute: 0.1 K/uL (ref 0.0–0.1)
Basophils Relative: 0.8 % (ref 0.0–3.0)
Eosinophils Absolute: 0.2 K/uL (ref 0.0–0.7)
Eosinophils Relative: 1.5 % (ref 0.0–5.0)
HCT: 47.9 % (ref 39.0–52.0)
Hemoglobin: 16.2 g/dL (ref 13.0–17.0)
Lymphocytes Relative: 20.7 % (ref 12.0–46.0)
Lymphs Abs: 2.2 K/uL (ref 0.7–4.0)
MCHC: 33.8 g/dL (ref 30.0–36.0)
MCV: 86.6 fl (ref 78.0–100.0)
Monocytes Absolute: 0.6 K/uL (ref 0.1–1.0)
Monocytes Relative: 6.2 % (ref 3.0–12.0)
Neutro Abs: 7.4 K/uL (ref 1.4–7.7)
Neutrophils Relative %: 70.8 % (ref 43.0–77.0)
Platelets: 175 K/uL (ref 150.0–400.0)
RBC: 5.53 Mil/uL (ref 4.22–5.81)
RDW: 14.2 % (ref 11.5–15.5)
WBC: 10.4 K/uL (ref 4.0–10.5)

## 2025-01-09 MED ORDER — DOXYCYCLINE HYCLATE 100 MG PO TABS: 100.0000 mg | ORAL_TABLET | Freq: Two times a day (BID) | ORAL | 0 refills | Status: AC

## 2025-01-09 MED ORDER — CEFTRIAXONE SODIUM 1 G IJ SOLR
1.0000 g | Freq: Once | INTRAMUSCULAR | Status: AC
  Administered 2025-01-09: 1 g via INTRAMUSCULAR

## 2025-01-09 NOTE — Progress Notes (Signed)
 "  Subjective:    Patient ID: Trevor Mack, male    DOB: 30-Mar-1956, 69 y.o.   MRN: [id number]  HPI  Trevor Mack is a 69 year old male who presents with swelling and drainage in the medial eye area.  He developed swelling in the medial right eye area between the bridge of his nose and  rt eye medial canthus area a few days ago. Initially his face was markedly swollen with his eyes nearly shut and the swelling was worse yesterday. The area was too painful to touch at first but is now less tender. After applying warm compresses the area opened and began draining.  He was scheduled for surgery on his right eye similar to prior left eye surgery, likely cataract, but has not completed it. He notes blurred vision in his rt eye but no acute change in vision. This was before swelling occurred as he states has cataracts so no acute changes  He denies fevers, chills, or sweats. He had brief episodes of dizziness two days ago and yesterday, each lasting only a few seconds. No dizziness presently,  He has COPD that limits his walking and contributes to difficulty accessing prior eye care, including transportation to specialty centers.    Review of Systems See hpi    Objective:   Physical Exam  General- No acute distress. Pleasant patient. Neck- Full range of motion, no jvd Lungs- Clear, even and unlabored. Heart- regular rate and rhythm. Neurologic- CNII- XII grossly intact.  Skin around rt eyeSwelling 1.5 cm between the bridge of the nose and the eye(scab in center and some yellow dc), adjacent to the medial canthus.  Some swelling of the right lower lid and a little bit of swelling in the upper lid as well.  EOMs are intact.      Assessment & Plan:  [phone]   Patient Instructions  Skin infection. Concern for Periorbital cellulitis, right eye Acute swelling and discharge in right periorbital region, likely infection. Differential includes abscess or periorbital cellulitis.  Concern for progression to orbital complications. - Ordered CT orbits with contrast to assess periorbital involvement.  Patient was offered CT study today around 1:30 in the afternoon but he did not want to wait.  He was also offered CT to be done tomorrow or Sunday but he declined both as well.  In the and he was scheduled for Monday at 9 AM per radiology staff.  Patient was made aware that if issues getting authorization or scheduled then he would need to be seen in the emergency department for any worsening signs and symptoms.  He expressed understanding during office visit.  I did explain to him in detail better to have the study done sooner rather than later to avoid complications. -Patient is also refusing to be referred back to his ophthalmologist which did his left eye cataract surgery.  I explained to him that this might present a problem and that referral to ophthalmologist often take excessive amount of time particularly for new patients. - Ordered CBC and metabolic panel to evaluate infection and metabolic status.(stat) - Administered Rocephin  1 gram IV. - Sent wound culture for bacterial growth. - Prescribed doxycycline  100 mg twice daily for 10 days, pending insurance authorization. - Scheduled follow-up Monday at 1:20 PM to assess improvement and review CT results. - Advised emergency department visit if CT results are concerning or if insurance does not authorize outpatient CT     I personally spent a total of 45  minutes in the care of the patient today including performing a medically appropriate exam/evaluation, counseling and educating, placing orders, referring and communicating with other health care professionals, and documenting clinical information in the EHR.   "

## 2025-01-09 NOTE — Patient Instructions (Addendum)
 Skin infection. Concern for Periorbital cellulitis, right eye Acute swelling and discharge in right periorbital region, likely infection. Differential includes abscess or periorbital cellulitis. Concern for progression to orbital complications. - Ordered CT orbits with contrast to assess periorbital involvement.  Patient was offered CT study today around 1:30 in the afternoon but he did not want to wait.  He was also offered CT to be done tomorrow or Sunday but he declined both as well.  In the and he was scheduled for Monday at 9 AM per radiology staff.  Patient was made aware that if issues getting authorization or scheduled then he would need to be seen in the emergency department for any worsening signs and symptoms.  He expressed understanding during office visit.  I did explain to him in detail better to have the study done sooner rather than later to avoid complications. -Patient is also refusing to be referred back to his ophthalmologist which did his left eye cataract surgery.  I explained to him that this might present a problem and that referral to ophthalmologist often take excessive amount of time particularly for new patients. - Ordered CBC and metabolic panel to evaluate infection and metabolic status.(stat) - Administered Rocephin  1 gram IV. - Sent wound culture for bacterial growth. - Prescribed doxycycline  100 mg twice daily for 10 days, pending insurance authorization. - Scheduled follow-up Monday at 1:20 PM to assess improvement and review CT results. - Advised emergency department visit if CT results are concerning or if insurance does not authorize outpatient CT

## 2025-01-12 ENCOUNTER — Ambulatory Visit (INDEPENDENT_AMBULATORY_CARE_PROVIDER_SITE_OTHER): Admitting: Medical

## 2025-01-12 ENCOUNTER — Ambulatory Visit (HOSPITAL_BASED_OUTPATIENT_CLINIC_OR_DEPARTMENT_OTHER)
Admission: RE | Admit: 2025-01-12 | Discharge: 2025-01-12 | Disposition: A | Discharge status: Home / Self Care | Source: Ambulatory Visit | Attending: Medical | Admitting: Medical

## 2025-01-12 ENCOUNTER — Encounter (HOSPITAL_BASED_OUTPATIENT_CLINIC_OR_DEPARTMENT_OTHER): Payer: Self-pay

## 2025-01-12 VITALS — BP 142/80 | HR 99 | Temp 98.4°F | Resp 15 | Ht 70.0 in | Wt 192.4 lb

## 2025-01-12 DIAGNOSIS — L03213 Periorbital cellulitis: Secondary | ICD-10-CM | POA: Insufficient documentation

## 2025-01-12 DIAGNOSIS — L989 Disorder of the skin and subcutaneous tissue, unspecified: Secondary | ICD-10-CM

## 2025-01-12 DIAGNOSIS — L089 Local infection of the skin and subcutaneous tissue, unspecified: Secondary | ICD-10-CM | POA: Insufficient documentation

## 2025-01-12 DIAGNOSIS — J338 Other polyp of sinus: Secondary | ICD-10-CM | POA: Diagnosis not present

## 2025-01-12 MED ORDER — IOHEXOL 300 MG/ML  SOLN
100.0000 mL | Freq: Once | INTRAMUSCULAR | Status: AC | PRN
  Administered 2025-01-12: 75 mL via INTRAVENOUS

## 2025-01-12 NOTE — Progress Notes (Signed)
 Pt is in office today for an apt pcp can give results then

## 2025-01-12 NOTE — Patient Instructions (Signed)
 Skin infection of right nasal bridge Raised area on right nasal bridge with inflammatory stranding, improved from previous imaging. No discharge, central scabbed region. No orbital cellulitis on CT.(but Nodular soft tissue thickening within the right medial canthus, measuring approximately 16 x 11 x 15 mm, without obvious inflammatory stranding.) - Continue doxycycline  100 mg twice daily. - Advised emergency care if severe swelling occurs. - Referral to dermatologist for evaluation and possible biopsy. - Referral to ophthalmologist due to proximity to eye.  Nasal polyp of right maxillary sinus Polyploid mucosal density in right maxillary sinus. - Referral to ENT for evaluation of polyploid mucosal density. -well see if ent will give opinion on above area as well.  - Follow up next Monday to reassess

## 2025-01-12 NOTE — Progress Notes (Signed)
 "  Subjective:    Patient ID: Trevor Mack, male    DOB: [birth year], 69 y.o.   MRN: [id number]  HPI  Ct orbit studies below.  IMPRESSION: 1. Nodular soft tissue thickening within the right medial canthus, measuring approximately 16 x 11 x 15 mm, without obvious inflammatory stranding. Clinical correlation and follow up is recommended. 2. No evidence of orbital cellulitis. 3. Polypoid mucosal density within the floor of the right maxillary sinus. 4. Status post left lens replacement; moderate calcific atheromatous disease within the carotid siphons.   Last  AVS below in    Skin infection. Concern for Periorbital cellulitis, right eye Acute swelling and discharge in right periorbital region, likely infection. Differential includes abscess or periorbital cellulitis. Concern for progression to orbital complications. - Ordered CT orbits with contrast to assess periorbital involvement.  Patient was offered CT study today around 1:30 in the afternoon but he did not want to wait.  He was also offered CT to be done tomorrow or Sunday but he declined both as well.  In the and he was scheduled for Monday at 9 AM per radiology staff.  Patient was made aware that if issues getting authorization or scheduled then he would need to be seen in the emergency department for any worsening signs and symptoms.  He expressed understanding during office visit.  I did explain to him in detail better to have the study done sooner rather than later to avoid complications. -Patient is also refusing to be referred back to his ophthalmologist which did his left eye cataract surgery.  I explained to him that this might present a problem and that referral to ophthalmologist often take excessive amount of time particularly for new patients. - Ordered CBC and metabolic panel to evaluate infection and metabolic status.(stat) - Administered Rocephin  1 gram IV. - Sent wound culture for bacterial growth. - Prescribed  doxycycline  100 mg twice daily for 10 days, pending insurance authorization. - Scheduled follow-up Monday at 1:20 PM to assess improvement and review CT results. - Advised emergency department visit if CT results are concerning or if insurance does not authorize outpatient CT    Trevor Mack is a 69 year old male with COPD who presents with a skin infection near the right eye.  He has a raised lesion near the right eye to the right of the nasal bridge toward the medial canthus, about 1 cm in diameter, with CT showing a 16  11  15  mm area with inflammatory stranding. He is taking doxycycline  100 mg twice daily for this infection.  Culture showed No white blood cells seen Few epithelial cells Rare Gram positive bacilli    Since starting rocephin  injection and doxycycine oral antibiotic the area is less swollen. No further dc  from are and upper and lower lids not swollen or red. No vision changes and no eye pain.     Review of Systems See hpi    Objective:   Physical Exam  General- No acute distress. Pleasant patient. Neck- Full range of motion, no jvd Lungs- Clear, even and unlabored. Heart- regular rate and rhythm. Neurologic- CNII- XII grossly intact.  Skin around rt eyeSwelling 1.0 cm between the bridge of the nose and the eye(scab in center but no dc), adjacent to the medial canthus.  No swelling of the right lower lid or upper lid today. No  redness.  EOMs are intact.      Assessment & Plan:   Patient Instructions  Skin  infection of right nasal bridge Raised area on right nasal bridge with inflammatory stranding, improved from previous imaging. No discharge, central scabbed region. No orbital cellulitis on CT.(but Nodular soft tissue thickening within the right medial canthus, measuring approximately 16 x 11 x 15 mm, without obvious inflammatory stranding.) - Continue doxycycline  100 mg twice daily. - Advised emergency care if severe swelling occurs. - Referral to  dermatologist for evaluation and possible biopsy. - Referral to ophthalmologist due to proximity to eye.  Nasal polyp of right maxillary sinus Polyploid mucosal density in right maxillary sinus. - Referral to ENT for evaluation of polyploid mucosal density. -well see if ent will give opinion on above area as well.  - Follow up next Monday to reassess  "

## 2025-01-12 NOTE — Progress Notes (Signed)
 Pt called left vm for him to return call

## 2025-01-13 LAB — CULTURE,AEROBIC BACTERIA WITH GRAM STAIN
MICRO NUMBER:: 17479058
SPECIMEN QUALITY:: ADEQUATE

## 2025-01-19 ENCOUNTER — Ambulatory Visit: Admitting: Medical

## 2025-01-21 ENCOUNTER — Ambulatory Visit: Admitting: Medical

## 2025-01-27 ENCOUNTER — Other Ambulatory Visit: Payer: Self-pay | Admitting: Medical

## 2025-01-27 NOTE — Telephone Encounter (Signed)
Rx refill sent to pt pharmacy 

## 2025-03-16 ENCOUNTER — Ambulatory Visit (HOSPITAL_BASED_OUTPATIENT_CLINIC_OR_DEPARTMENT_OTHER)
